# Patient Record
Sex: Female | Born: 1950
Health system: Southern US, Community
[De-identification: ages and names within clinical notes are randomized; demographics above are authoritative.]

## PROBLEM LIST (undated history)

## (undated) DIAGNOSIS — M1990 Unspecified osteoarthritis, unspecified site: Secondary | ICD-10-CM

## (undated) DIAGNOSIS — B029 Zoster without complications: Secondary | ICD-10-CM

## (undated) DIAGNOSIS — C539 Malignant neoplasm of cervix uteri, unspecified: Secondary | ICD-10-CM

## (undated) DIAGNOSIS — E119 Type 2 diabetes mellitus without complications: Secondary | ICD-10-CM

## (undated) DIAGNOSIS — E785 Hyperlipidemia, unspecified: Secondary | ICD-10-CM

## (undated) DIAGNOSIS — I739 Peripheral vascular disease, unspecified: Secondary | ICD-10-CM

## (undated) DIAGNOSIS — F419 Anxiety disorder, unspecified: Secondary | ICD-10-CM

## (undated) HISTORY — DX: Zoster without complications: B02.9

## (undated) HISTORY — DX: Malignant neoplasm of cervix uteri, unspecified: C53.9

## (undated) HISTORY — PX: PARTIAL HYSTERECTOMY: SHX80

## (undated) HISTORY — DX: Type 2 diabetes mellitus without complications: E11.9

## (undated) HISTORY — DX: Anxiety disorder, unspecified: F41.9

## (undated) HISTORY — PX: ROTATOR CUFF REPAIR: SHX139

## (undated) HISTORY — DX: Unspecified osteoarthritis, unspecified site: M19.90

## (undated) HISTORY — DX: Peripheral vascular disease, unspecified: I73.9

## (undated) HISTORY — PX: ABDOMINAL HYSTERECTOMY: SHX81

## (undated) HISTORY — DX: Hyperlipidemia, unspecified: E78.5

---

## 2014-11-29 ENCOUNTER — Encounter: Payer: Self-pay | Admitting: Physician Assistant

## 2014-11-29 ENCOUNTER — Ambulatory Visit: Payer: Self-pay | Admitting: Physician Assistant

## 2014-11-29 VITALS — BP 138/88 | HR 74 | Temp 98.5°F

## 2014-11-29 DIAGNOSIS — J209 Acute bronchitis, unspecified: Secondary | ICD-10-CM

## 2014-11-29 MED ORDER — METHYLPREDNISOLONE 4 MG PO TBPK: 4.0000 mg | ORAL_TABLET | Freq: Four times a day (QID) | ORAL | Status: AC

## 2014-11-29 MED ORDER — IPRATROPIUM-ALBUTEROL 0.5-2.5 (3) MG/3ML IN SOLN: 3.0000 mL | Freq: Once | RESPIRATORY_TRACT | Status: DC

## 2014-11-29 NOTE — Progress Notes (Signed)
S: C/o cough and congestion with wheezing and chest pain, chest is sore from coughing,mucus is yellow, or cough hacking; cough is making her fatigued, hears wheezing, using inhaler with little relief,  keeping pt awake at night;  denies cardiac type chest pain, v/d, abd pain Remainder ros neg  O: vitals wnl, nad, tms clear, throat injected, neck supple no lymph, lungs with wheezing, cv rrr, neuro intact; svn duoneb given in clinic; lungs mostly c t a after svn  A:  Acute bronchitis   P:   Medrol dose pack; use otc meds, tylenol or motrin as needed for fever/chills, return if not better in 3 -5 days, return earlier if worsening, will consider levaquin if patient worsening

## 2014-12-18 ENCOUNTER — Ambulatory Visit (INDEPENDENT_AMBULATORY_CARE_PROVIDER_SITE_OTHER): Payer: 59 | Admitting: Primary Care

## 2014-12-18 ENCOUNTER — Encounter: Payer: Self-pay | Admitting: Primary Care

## 2014-12-18 VITALS — BP 116/70 | HR 68 | Temp 97.8°F | Ht 66.25 in | Wt 175.1 lb

## 2014-12-18 DIAGNOSIS — F418 Other specified anxiety disorders: Secondary | ICD-10-CM

## 2014-12-18 DIAGNOSIS — E119 Type 2 diabetes mellitus without complications: Secondary | ICD-10-CM

## 2014-12-18 DIAGNOSIS — F419 Anxiety disorder, unspecified: Secondary | ICD-10-CM

## 2014-12-18 DIAGNOSIS — R609 Edema, unspecified: Secondary | ICD-10-CM

## 2014-12-18 DIAGNOSIS — F32A Depression, unspecified: Secondary | ICD-10-CM

## 2014-12-18 DIAGNOSIS — R21 Rash and other nonspecific skin eruption: Secondary | ICD-10-CM

## 2014-12-18 DIAGNOSIS — M25473 Effusion, unspecified ankle: Secondary | ICD-10-CM

## 2014-12-18 DIAGNOSIS — F411 Generalized anxiety disorder: Secondary | ICD-10-CM | POA: Insufficient documentation

## 2014-12-18 DIAGNOSIS — E1165 Type 2 diabetes mellitus with hyperglycemia: Secondary | ICD-10-CM | POA: Insufficient documentation

## 2014-12-18 DIAGNOSIS — F329 Major depressive disorder, single episode, unspecified: Secondary | ICD-10-CM | POA: Insufficient documentation

## 2014-12-18 MED ORDER — PREDNISONE 20 MG PO TABS: ORAL_TABLET | ORAL | Status: DC

## 2014-12-18 MED ORDER — PERMETHRIN 5 % EX CREA: 1.0000 "application " | TOPICAL_CREAM | Freq: Once | CUTANEOUS | Status: DC

## 2014-12-18 NOTE — Assessment & Plan Note (Signed)
Present to bilateral lower extremities gradually throughout the day, improvement after a full night's rest. 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  Discussed to rise and walk every 30 minutes at work. Elevate feet when at home.

## 2014-12-18 NOTE — Assessment & Plan Note (Signed)
Diagnoed 6-7 years ago and is currently managed on Citalopram 20 mg daily and Alprazolam 0.5 mg very sparingly. Feels well managed on citalopram. Denies SI/HI.

## 2014-12-18 NOTE — Progress Notes (Signed)
Pre visit review using our clinic review tool, if applicable. No additional management support is needed unless otherwise documented below in the visit note. 

## 2014-12-18 NOTE — Patient Instructions (Signed)
Elevate your legs when you get home after work to reduce ankle swelling. Try to get up every 30 minutes while at work to keep your ankles from swelling.  Please schedule a physical with me in the next 3 months. You will also schedule a lab only appointment one week prior. We will discuss your lab results during your physical.  Apply Permethrin cream once. Start at your neck and apply over your entire body, including in between your toes. Leave on for 8 hours and then wash off.  Start prednisone tablets. Take 2 tablets by mouth daily for 4 days, then 1 tablet daily for 4 days.  It was a pleasure to meet you today! Please don't hesitate to call me with any questions. Welcome to [facility]!

## 2014-12-18 NOTE — Assessment & Plan Note (Signed)
Endorses long history of borderline diabetes and was told several years ago she had full diabetes. She is unsure of her last A1C, perhaps drawn 2 years ago. She manages with diet alone. Will check A1C at upcoming physical.

## 2014-12-18 NOTE — Progress Notes (Signed)
Subjective:    Patient ID: Maria Sloan, female    DOB: 1950/05/20, 64 y.o.   MRN: [id number]  HPI  Maria Sloan is a 64 year old female who presents today to establish care and discuss the problems mentioned below. Will obtain old records.  1) Ankle Swelling: Present bilaterally. She will notice gradual swelling throughout the day that has been present for the past 2-3 months since working at her new occupation. She works at a desk all day long and does not get up frequently to walk. Denies pain, changes in skin tone.   2) Rash: Present intermittently since June 2016. The rash will last for about 2.5 weeks at a time and are located to bilateral upper and lower extremities. She describes her rash as itchy. Denies pain. She just moved into a new apartment and got her mattress out of storage. She does have a dog and noticed a few fleas on her dog who has since been treated. She's tried applying OTC hydrocortisone and calomine lotion with temporary relief. She has used insecticide in her apartment.   3) Anxiety and Depression: Diagnosed about 6-7 years ago. Currently managed on Citalopram 20 mg and Alprazolam 0.5 mg. She will use the Alprazolam very sparingly at night for sleep and anxiety. Last tablet of Alprazolam was about 2-3 months ago. She attempted to wean off the Citalopram just after moving to [place] and noticed an increase in tearfulness and sadness. She has since resumed her citalopram and feels well managed.  4) Type 2 Diabetes: Diagnosed about 10 years ago. She manages with healthy diet and exercise. She is unsure of her last A1C, believes it may have been 2 years ago. Denies numbness, tingling, chest pain, dizziness.  Review of Systems  Constitutional: Negative for fever and chills.  HENT: Negative for rhinorrhea.   Respiratory: Negative for shortness of breath and wheezing.   Cardiovascular: Negative for chest pain.  Gastrointestinal: Negative for diarrhea and constipation.    Genitourinary: Negative for difficulty urinating.       Partial hysterectomy with ovaries.  Musculoskeletal: Positive for arthralgias. Negative for myalgias.       Chronic knee pain, tolerable. Does not interfere with daily routine.  Skin: Positive for rash.  Allergic/Immunologic: Positive for environmental allergies.  Neurological: Negative for dizziness and numbness.  Psychiatric/Behavioral: Negative for sleep disturbance. The patient is not nervous/anxious.        See HPI       Past Medical History  Diagnosis Date  . PVD (peripheral vascular disease) ([place])   . Type 2 diabetes mellitus ([place])   . Anxiety and depression   . Cervical cancer ([place])   . Shingles     Social History   Social History  . Marital Status: Unknown    Spouse Name: N/A  . Number of Children: N/A  . Years of Education: N/A   Occupational History  . Not on file.   Social History Main Topics  . Smoking status: Former Smoker    Quit date: 11/29/1994  . Smokeless tobacco: Never Used  . Alcohol Use: Not on file  . Drug Use: Not on file  . Sexual Activity: Not on file   Other Topics Concern  . Not on file   Social History Narrative   Divorced.   1 son.    Moved from [place].   Enjoys making jewelry, spending time with family, crafts, reading.    Past Surgical History  Procedure Laterality Date  . Partial hysterectomy    .  Rotator cuff repair Right     Family History  Problem Relation Age of Onset  . Emphysema Father   . Lung cancer Father   . Stroke Mother   . Hyperlipidemia Father   . Hyperlipidemia Sister     Allergies  Allergen Reactions  . Actifed Cold-Allergy [Chlorpheniramine-Phenylephrine]   . Compazine [Prochlorperazine]   . Penicillins     Current Outpatient Prescriptions on File Prior to Visit  Medication Sig Dispense Refill  . ALPRAZolam (XANAX) 0.5 MG tablet Take 0.5 mg by mouth at bedtime as needed for anxiety.    . citalopram (CELEXA) 20 MG tablet Take 20 mg by  mouth daily.  1   No current facility-administered medications on file prior to visit.    BP 116/70 mmHg  Pulse 68  Temp(Src) 97.8 F (36.6 C) (Oral)  Ht 5' 6.25" (1.683 m)  Wt 175 lb 1.9 oz (79.434 kg)  BMI 28.04 kg/m2  SpO2 97%    Objective:   Physical Exam  Constitutional: She is oriented to person, place, and time. She appears well-nourished.  Neck: Neck supple.  Cardiovascular: Normal rate and regular rhythm.   Pulmonary/Chest: Effort normal and breath sounds normal.  Musculoskeletal: Normal range of motion.  Neurological: She is alert and oriented to person, place, and time.  Skin: Skin is warm and dry.  Small bumps noted to bilateral upper extremities, hands, and feet that somewhat represent scabies or flea bites. No drainage. Non tender.  Psychiatric: She has a normal mood and affect.          Assessment & Plan:  Rash:  Intermittent since June 2016 after moving to a new apartment. Mattress stored in storage, also has a dog with fleas. Suspect either scabies or fleas. Exam and HPI are suspicious for scabies. Will treat for both. RX for Permethrin to use once and also with prednisone taper. Discussed to Lysol bed frames and mattresses, discussed proper cleaning and flea treatments. Follow up PRN.

## 2014-12-26 ENCOUNTER — Ambulatory Visit (INDEPENDENT_AMBULATORY_CARE_PROVIDER_SITE_OTHER): Payer: 59 | Admitting: Internal Medicine

## 2014-12-26 ENCOUNTER — Encounter: Payer: Self-pay | Admitting: Internal Medicine

## 2014-12-26 VITALS — BP 122/78 | HR 70

## 2014-12-26 DIAGNOSIS — Z87891 Personal history of nicotine dependence: Secondary | ICD-10-CM

## 2014-12-26 DIAGNOSIS — R05 Cough: Secondary | ICD-10-CM | POA: Diagnosis not present

## 2014-12-26 DIAGNOSIS — Z825 Family history of asthma and other chronic lower respiratory diseases: Secondary | ICD-10-CM

## 2014-12-26 DIAGNOSIS — R059 Cough, unspecified: Secondary | ICD-10-CM | POA: Insufficient documentation

## 2014-12-26 LAB — NITRIC OXIDE: Nitric Oxide: 18

## 2014-12-26 NOTE — Addendum Note (Signed)
Addended by: Beckie Busing on: 12/26/2014 05:48 PM   Modules accepted: Orders

## 2014-12-26 NOTE — Progress Notes (Signed)
Subjective:    Patient ID: Maria Sloan, female    DOB: 07/04/1950, 64 y.o.   MRN: [id number] PCP Sheral Flow, NP  HPI  IOV 12/26/2014  Chief Complaint  Patient presents with  . Pulmonary Consult    Pt c/o recent bronchial infection, most symptoms have improved although still c/o continued mucus in her throat. Pt has family hx of COPD.    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 She has to talk on the phone a lot. She moved from [facility] in April 2016 to be with her son. She has a less than 20 pack smoking history smoking from 1978 through [place] at three fourth of a pack a day. Her dad has COPD.  At baseline she is reasonably fit and has no health issues. Then 11/23/2014 she developed what she thought was a bronchitis with Pat cough. Then on Monday, 11/26/2014 she was seen at employee health and was given antibiotics for "bronchial infection" according to her history. Subsequently on Thursday, 11/30/2014 a colleague nurse at work but she had asthma because by the stem patient was having cough and shortness of breath that persisted. Shortness of breath was new. Subsequently some 2 weeks later the cough resolved. By this time the dyspnea also had improved. At this point in time dyspnea still persistent. One week ago she saw her primary care physician and given family history of COPD and former history of smoking has been referred here.  Currently she reports no cough but she does have a little shortness of breath. It is present only in the daytime. Never at nighttime. Made worse with talking on the phone. It is associated with scratchy throat, hoarse voice. She's also clearing the throat a lot. She feels like she has laryngitis. This is when dyspnea gets worse. Sometimes dyspnea gets worse with exertion as well. Symptoms are better with voice rest and physical rest. There is no associated wheezing or  fever or chills or hemoptysis.   COPD CAT score is 7 and shows only mild burden of symptoms. A diagnosis of COPD is not confirmed in this patient.  Exhaled nitric oxide today in the office shows 18 ppb and normal      has a past medical history of PVD (peripheral vascular disease) ([place]); Type 2 diabetes mellitus ([place]); Anxiety and depression; Cervical cancer ([place]); and Shingles.   reports that she quit smoking about 20 years ago. She has never used smokeless tobacco.  Past Surgical History  Procedure Laterality Date  . Partial hysterectomy    . Rotator cuff repair Right     Allergies  Allergen Reactions  . Actifed Cold-Allergy [Chlorpheniramine-Phenylephrine]   . Compazine [Prochlorperazine]   . Penicillins     Immunization History  Administered Date(s) Administered  . Influenza-Unspecified 12/10/2014    Family History  Problem Relation Age of Onset  . Emphysema Father   . Lung cancer Father   . Stroke Mother   . Hyperlipidemia Father   . Hyperlipidemia Sister      Current outpatient prescriptions:  .  ALPRAZolam (XANAX) 0.5 MG tablet, Take 0.5 mg by mouth at bedtime as needed for anxiety., Disp: , Rfl:  .  citalopram (CELEXA) 20 MG tablet, Take 20 mg by mouth daily., Disp: , Rfl: 1 .  Ginkgo Biloba 40 MG TABS, Take 1 tablet by mouth daily., Disp: , Rfl:  .  Misc Natural Products (OSTEO BI-FLEX JOINT SHIELD  PO), Take 1 tablet by mouth daily., Disp: , Rfl:  .  Multiple Vitamin (MULTIVITAMIN) tablet, Take 1 tablet by mouth daily., Disp: , Rfl:  .  permethrin (ELIMITE) 5 % cream, Apply 1 application topically once., Disp: 60 g, Rfl: 0 .  Potassium 75 MG TABS, Take 1 tablet by mouth daily., Disp: , Rfl:  .  predniSONE (DELTASONE) 20 MG tablet, Take 2 tablets daily for 4 days, then 1 tablet daily for 4 days., Disp: 12 tablet, Rfl: 0      Review of Systems  Constitutional: Negative for fever and unexpected weight change.  HENT: Positive for congestion. Negative for  dental problem, ear pain, nosebleeds, postnasal drip, rhinorrhea, sinus pressure, sneezing, sore throat and trouble swallowing.   Eyes: Negative for redness and itching.  Respiratory: Positive for cough and shortness of breath. Negative for chest tightness and wheezing.   Cardiovascular: Negative for palpitations and leg swelling.  Gastrointestinal: Negative for nausea and vomiting.  Genitourinary: Negative for dysuria.  Musculoskeletal: Negative for joint swelling.  Skin: Negative for rash.  Neurological: Negative for headaches.  Hematological: Does not bruise/bleed easily.  Psychiatric/Behavioral: Negative for dysphoric mood. The patient is not nervous/anxious.        Objective:   Physical Exam  Constitutional: She is oriented to person, place, and time. She appears well-developed and well-nourished. No distress.  HENT:  Head: Normocephalic and atraumatic.  Right Ear: External ear normal.  Left Ear: External ear normal.  Mouth/Throat: Oropharynx is clear and moist. No oropharyngeal exudate.  Eyes: Conjunctivae and EOM are normal. Pupils are equal, round, and reactive to light. Right eye exhibits no discharge. Left eye exhibits no discharge. No scleral icterus.  Neck: Normal range of motion. Neck supple. No JVD present. No tracheal deviation present. No thyromegaly present.  Cardiovascular: Normal rate, regular rhythm, normal heart sounds and intact distal pulses.  Exam reveals no gallop and no friction rub.   No murmur heard. Pulmonary/Chest: Effort normal and breath sounds normal. No respiratory distress. She has no wheezes. She has no rales. She exhibits no tenderness.  Abdominal: Soft. Bowel sounds are normal. She exhibits no distension and no mass. There is no tenderness. There is no rebound and no guarding.  Musculoskeletal: Normal range of motion. She exhibits no edema or tenderness.  Lymphadenopathy:    She has no cervical adenopathy.  Neurological: She is alert and oriented  to person, place, and time. She has normal reflexes. No cranial nerve deficit. She exhibits normal muscle tone. Coordination normal.  Skin: Skin is warm and dry. No rash noted. She is not diaphoretic. No erythema. No pallor.  Psychiatric: She has a normal mood and affect. Her behavior is normal. Judgment and thought content normal.  Vitals reviewed.   Filed Vitals:   12/26/14 1633  BP: 122/78  Pulse: 70  SpO2: 97%         Assessment & Plan:     ICD-9-CM ICD-10-CM   1. Cough 786.2 R05 Nitric oxide     Nitric oxide  2. Family history of COPD (chronic obstructive pulmonary disease) V17.6 Z82.5   3. History of smoking 10-25 pack years V15.82 Z87.891      Even though  you feel feel shortness of breath this syndrome fits in with post viral reactive cough Expect this to normally burnt out in the next 4-8 week In order to ensure this does not turn chronic I recommend the following   -  take generic fluticasone inhaler 2 squirts each  nostril daily x 4-6 weeks  - OTC Zegerid 20mg  daily on empty stomach x 4- 6 weeks  - Take adequate voice rest  - Drink plenty of water when you ha ve symptoms  - Try sugarless lozenge when you have symptoms   Follow-up  - Full pulmonary function test in 6 weeks  - Return to see me or my nurse practitioner in 6 weeks after full pulmonary function test - Return to see me or my nurse practitioner in 6 weeks after full pulmonary function test - If cough persists move to phase 2 of cough workup including imaging   Dr. Brand Males, M.D., [facility].C.P Pulmonary and Critical Care Medicine Staff Physician [facility] Pager: [phone], If no answer or between  15:00h - 7:00h: call [phone]  12/26/2014 5:38 PM

## 2014-12-26 NOTE — Patient Instructions (Addendum)
ICD-9-CM ICD-10-CM   1. Cough 786.2 R05 Nitric oxide     Nitric oxide  2. Family history of COPD (chronic obstructive pulmonary disease) V17.6 Z82.5   3. History of smoking 10-25 pack years V15.82 Z87.891    Even though  you feel feel shortness of breath this syndrome fits in with post viral reactive cough Expect this to normally burnt out in the next 4-8 week In order to ensure this does not turn chronic I recommend the following   -  take generic fluticasone inhaler 2 squirts each nostril daily x 4-6 weeks  - OTC Zegerid 20mg  daily on empty stomach x 4- 6 weeks  - Take adequate voice rest  - Drink plenty of water when you ha ve symptoms  - Try sugarless lozenge when you have symptoms   Follow-up  - Full pulmonary function test in 6 weeks because of prior smoking history and family hx copd  - Return to see me or my nurse practitioner in 6 weeks after full pulmonary function test - If cough persists move to phase 2 of cough workup including imaging

## 2014-12-27 ENCOUNTER — Telehealth: Payer: Self-pay | Admitting: Internal Medicine

## 2014-12-27 DIAGNOSIS — Z87891 Personal history of nicotine dependence: Secondary | ICD-10-CM

## 2014-12-27 NOTE — Telephone Encounter (Signed)
Maria Sloan  Let patient Maria Sloan  Know that whens he comes back in 6 weeks we will do cxr 2 view . Please ensure. Indication: hx of smoking, cough, and family hx copd  Thanks  Dr. Brand Males, M.D., [facility].C.P Pulmonary and Critical Care Medicine Staff Physician [place] [facility] Pager: [phone], If no answer or between  15:00h - 7:00h: call [phone]  12/27/2014 9:48 AM

## 2014-12-28 NOTE — Telephone Encounter (Signed)
lmtcb for pt.  

## 2014-12-31 NOTE — Telephone Encounter (Signed)
lmomtcb x 2  

## 2015-01-01 NOTE — Telephone Encounter (Signed)
lmomtcb x1 for pt 

## 2015-01-01 NOTE — Telephone Encounter (Signed)
Patient returned call, can be reached at [phone].

## 2015-01-02 NOTE — Telephone Encounter (Signed)
Pt is aware. Order was placed. Nothing further was needed.

## 2015-01-02 NOTE — Telephone Encounter (Signed)
[phone], pt cb

## 2015-02-07 ENCOUNTER — Encounter: Payer: Self-pay | Admitting: Adult Health

## 2015-02-07 ENCOUNTER — Ambulatory Visit (INDEPENDENT_AMBULATORY_CARE_PROVIDER_SITE_OTHER): Payer: 59 | Admitting: Internal Medicine

## 2015-02-07 ENCOUNTER — Ambulatory Visit (INDEPENDENT_AMBULATORY_CARE_PROVIDER_SITE_OTHER)
Admission: RE | Admit: 2015-02-07 | Discharge: 2015-02-07 | Disposition: A | Discharge status: Home / Self Care | Payer: 59 | Source: Ambulatory Visit | Attending: Adult Health | Admitting: Adult Health

## 2015-02-07 ENCOUNTER — Ambulatory Visit (INDEPENDENT_AMBULATORY_CARE_PROVIDER_SITE_OTHER): Payer: 59 | Admitting: Adult Health

## 2015-02-07 VITALS — BP 108/72 | HR 69 | Temp 97.9°F | Ht 66.0 in | Wt 184.0 lb

## 2015-02-07 DIAGNOSIS — Z87891 Personal history of nicotine dependence: Secondary | ICD-10-CM

## 2015-02-07 DIAGNOSIS — R05 Cough: Secondary | ICD-10-CM

## 2015-02-07 DIAGNOSIS — Z825 Family history of asthma and other chronic lower respiratory diseases: Secondary | ICD-10-CM

## 2015-02-07 DIAGNOSIS — R059 Cough, unspecified: Secondary | ICD-10-CM

## 2015-02-07 LAB — PULMONARY FUNCTION TEST
DL/VA % pred: 75 %
DL/VA: 3.91 ml/min/mmHg/L
DLCO UNC % PRED: 56 %
DLCO unc: 16.13 ml/min/mmHg
FEF 25-75 Post: 2.22 L/sec
FEF 25-75 Pre: 2.56 L/sec
FEF2575-%CHANGE-POST: -13 %
FEF2575-%Pred-Post: 95 %
FEF2575-%Pred-Pre: 110 %
FEV1-%CHANGE-POST: -3 %
FEV1-%PRED-POST: 85 %
FEV1-%Pred-Pre: 88 %
FEV1-PRE: 2.39 L
FEV1-Post: 2.31 L
FEV1FVC-%CHANGE-POST: 3 %
FEV1FVC-%Pred-Pre: 108 %
FEV6-%Change-Post: -6 %
FEV6-%PRED-PRE: 83 %
FEV6-%Pred-Post: 78 %
FEV6-PRE: 2.85 L
FEV6-Post: 2.66 L
FEV6FVC-%PRED-PRE: 104 %
FEV6FVC-%Pred-Post: 104 %
FVC-%CHANGE-POST: -6 %
FVC-%PRED-POST: 75 %
FVC-%PRED-PRE: 80 %
FVC-POST: 2.66 L
FVC-PRE: 2.86 L
POST FEV1/FVC RATIO: 87 %
POST FEV6/FVC RATIO: 100 %
PRE FEV6/FVC RATIO: 100 %
Pre FEV1/FVC ratio: 84 %
RV % pred: 78 %
RV: 1.76 L
TLC % pred: 82 %
TLC: 4.54 L

## 2015-02-07 NOTE — Assessment & Plan Note (Signed)
Most likely a post viral cough- resolved Pulmonary function test today shows a normal lung function. Patient does have a diffusing defect, questionable etiology. She has no desaturations with ambulation. In The office on room air. And chest x-ray is clear. No known anemia  The patient's symptoms return will need further investigation.   Plan  Continue on current regimen Follow  up in 3 months with Dr. Chase Caller

## 2015-02-07 NOTE — Progress Notes (Signed)
PFT done today. 

## 2015-02-07 NOTE — Progress Notes (Signed)
Subjective:    Patient ID: Maria Sloan, female    DOB: [birth year], 64 y.o.   MRN: [id number]  HPI 64 yo female   02/07/2015 Follow up :  Returns for a 6 week follow-up. She was seen for a ronsult last visit for persistent cough and possible COPD. Had a slow to resolve bronchitis. A former smoker. Has a family history of COPD. Patient returns today reporting that she is feeling much improved. Her cough has totally resolved. Patient denies any shortness of breath.. EXT today shows FEV1 at 88%, ratio 84, FVC 80%, no bronchodilator response. Diffusing capacity decreased at 56%. Chest x-ray today is clear. Walk test in office with no desaturations on room air. Patient denies any known history of anemia. He denies any chest pain, palpitations, orthopnea, PND or leg swelling..  Past Medical History  Diagnosis Date  . PVD (peripheral vascular disease) ([place])   . Type 2 diabetes mellitus ([place])   . Anxiety and depression   . Cervical cancer ([place])   . Shingles      Current Outpatient Prescriptions on File Prior to Visit  Medication Sig Dispense Refill  . ALPRAZolam (XANAX) 0.5 MG tablet Take 0.5 mg by mouth at bedtime as needed for anxiety.    . citalopram (CELEXA) 20 MG tablet Take 20 mg by mouth daily.  1  . Ginkgo Biloba 40 MG TABS Take 1 tablet by mouth daily.    . Misc Natural Products (OSTEO BI-FLEX JOINT SHIELD PO) Take 1 tablet by mouth daily.    . Multiple Vitamin (MULTIVITAMIN) tablet Take 1 tablet by mouth daily.    . permethrin (ELIMITE) 5 % cream Apply 1 application topically once. 60 g 0  . Potassium 75 MG TABS Take 1 tablet by mouth daily.     No current facility-administered medications on file prior to visit.    Review of Systems Constitutional:   No  weight loss, night sweats,  Fevers, chills, fatigue, or  lassitude.  HEENT:   No headaches,  Difficulty swallowing,  Tooth/dental problems, or  Sore throat,                No sneezing, itching, ear ache, nasal  congestion, post nasal drip,   CV:  No chest pain,  Orthopnea, PND, swelling in lower extremities, anasarca, dizziness, palpitations, syncope.   GI  No heartburn, indigestion, abdominal pain, nausea, vomiting, diarrhea, change in bowel habits, loss of appetite, bloody stools.   Resp: No shortness of breath with exertion or at rest.  No excess mucus, no productive cough,  No non-productive cough,  No coughing up of blood.  No change in color of mucus.  No wheezing.  No chest wall deformity  Skin: no rash or lesions.  GU: no dysuria, change in color of urine, no urgency or frequency.  No flank pain, no hematuria   MS:  No joint pain or swelling.  No decreased range of motion.  No back pain.  Psych:  No change in mood or affect. No depression or anxiety.  No memory loss.         Objective:   Physical Exam  GEN: A/Ox3; pleasant , NAD, well nourished   Vital signs reviewed  HEENT:  [place]/AT,  EACs-clear, TMs-wnl, NOSE-clear, THROAT-clear, no lesions, no postnasal drip or exudate noted.   NECK:  Supple w/ fair ROM; no JVD; normal carotid impulses w/o bruits; no thyromegaly or nodules palpated; no lymphadenopathy.  RESP  Clear  P & A; w/o,  wheezes/ rales/ or rhonchi.no accessory muscle use, no dullness to percussion  CARD:  RRR, no m/r/g  , no peripheral edema, pulses intact, no cyanosis or clubbing.  GI:   Soft & nt; nml bowel sounds; no organomegaly or masses detected.  Musco: Warm bil, no deformities or joint swelling noted.   Neuro: alert, no focal deficits noted.    Skin: Warm, no lesions or rashes        Assessment & Plan:

## 2015-02-07 NOTE — Patient Instructions (Signed)
Glad you are feeling better.  Continue on current regimen.  Follow up Dr. Chase Caller in 3-4 months and As needed

## 2015-03-05 ENCOUNTER — Other Ambulatory Visit: Payer: Self-pay | Admitting: Primary Care

## 2015-03-05 DIAGNOSIS — Z0000 Encounter for general adult medical examination without abnormal findings: Secondary | ICD-10-CM

## 2015-03-05 DIAGNOSIS — E785 Hyperlipidemia, unspecified: Secondary | ICD-10-CM

## 2015-03-14 ENCOUNTER — Other Ambulatory Visit (INDEPENDENT_AMBULATORY_CARE_PROVIDER_SITE_OTHER): Payer: 59

## 2015-03-14 ENCOUNTER — Telehealth: Payer: Self-pay | Admitting: Primary Care

## 2015-03-14 DIAGNOSIS — Z0000 Encounter for general adult medical examination without abnormal findings: Secondary | ICD-10-CM

## 2015-03-14 DIAGNOSIS — E785 Hyperlipidemia, unspecified: Secondary | ICD-10-CM

## 2015-03-14 LAB — COMPREHENSIVE METABOLIC PANEL
ALBUMIN: 4.2 g/dL (ref 3.5–5.2)
ALT: 27 U/L (ref 0–35)
AST: 26 U/L (ref 0–37)
Alkaline Phosphatase: 57 U/L (ref 39–117)
BILIRUBIN TOTAL: 0.4 mg/dL (ref 0.2–1.2)
BUN: 15 mg/dL (ref 6–23)
CALCIUM: 9.2 mg/dL (ref 8.4–10.5)
CO2: 30 mEq/L (ref 19–32)
CREATININE: 0.78 mg/dL (ref 0.40–1.20)
Chloride: 103 mEq/L (ref 96–112)
GFR: 78.8 mL/min (ref >60.00)
Glucose, Bld: 94 mg/dL (ref 70–99)
Potassium: 4.1 mEq/L (ref 3.5–5.1)
SODIUM: 139 meq/L (ref 135–145)
TOTAL PROTEIN: 6.8 g/dL (ref 6.0–8.3)

## 2015-03-14 LAB — LIPID PANEL
CHOL/HDL RATIO: 7
CHOLESTEROL: 231 mg/dL — AB (ref 0–200)
HDL: 31.4 mg/dL — ABNORMAL LOW (ref >39.00)
Triglycerides: 613 mg/dL — ABNORMAL HIGH (ref 0.0–149.0)

## 2015-03-14 LAB — LDL CHOLESTEROL, DIRECT: LDL DIRECT: 110 mg/dL

## 2015-03-14 LAB — VITAMIN D 25 HYDROXY (VIT D DEFICIENCY, FRACTURES): VITD: 18.86 ng/mL — ABNORMAL LOW (ref 30.00–100.00)

## 2015-03-14 LAB — HEMOGLOBIN A1C: HEMOGLOBIN A1C: 5.9 % (ref 4.6–6.5)

## 2015-03-14 LAB — TSH: TSH: 0.92 u[IU]/mL (ref 0.35–4.50)

## 2015-03-14 NOTE — Telephone Encounter (Signed)
Okay to do labs, but if she'd like to do them during her physical, that's fine too.

## 2015-03-14 NOTE — Telephone Encounter (Signed)
Patient had her labs done.

## 2015-03-14 NOTE — Telephone Encounter (Signed)
Pt is getting over a cold and does not know whether her cold will affect her labs. Pt is unsure if she should still come in for labs  cb number is [phone] Pt request cb

## 2015-03-21 ENCOUNTER — Encounter: Payer: Self-pay | Admitting: Primary Care

## 2015-04-05 ENCOUNTER — Ambulatory Visit (INDEPENDENT_AMBULATORY_CARE_PROVIDER_SITE_OTHER): Payer: 59 | Admitting: Primary Care

## 2015-04-05 ENCOUNTER — Encounter: Payer: Self-pay | Admitting: Primary Care

## 2015-04-05 VITALS — BP 124/74 | HR 67 | Temp 98.2°F | Ht 66.0 in | Wt 185.8 lb

## 2015-04-05 DIAGNOSIS — Z13820 Encounter for screening for osteoporosis: Secondary | ICD-10-CM | POA: Diagnosis not present

## 2015-04-05 DIAGNOSIS — Z1211 Encounter for screening for malignant neoplasm of colon: Secondary | ICD-10-CM

## 2015-04-05 DIAGNOSIS — E782 Mixed hyperlipidemia: Secondary | ICD-10-CM | POA: Insufficient documentation

## 2015-04-05 DIAGNOSIS — Z1239 Encounter for other screening for malignant neoplasm of breast: Secondary | ICD-10-CM | POA: Diagnosis not present

## 2015-04-05 DIAGNOSIS — E785 Hyperlipidemia, unspecified: Secondary | ICD-10-CM | POA: Insufficient documentation

## 2015-04-05 DIAGNOSIS — Z0000 Encounter for general adult medical examination without abnormal findings: Secondary | ICD-10-CM

## 2015-04-05 DIAGNOSIS — E559 Vitamin D deficiency, unspecified: Secondary | ICD-10-CM

## 2015-04-05 DIAGNOSIS — R7303 Prediabetes: Secondary | ICD-10-CM

## 2015-04-05 DIAGNOSIS — E781 Pure hyperglyceridemia: Secondary | ICD-10-CM

## 2015-04-05 DIAGNOSIS — F32A Depression, unspecified: Secondary | ICD-10-CM

## 2015-04-05 DIAGNOSIS — Z23 Encounter for immunization: Secondary | ICD-10-CM | POA: Diagnosis not present

## 2015-04-05 DIAGNOSIS — F329 Major depressive disorder, single episode, unspecified: Secondary | ICD-10-CM

## 2015-04-05 DIAGNOSIS — F418 Other specified anxiety disorders: Secondary | ICD-10-CM

## 2015-04-05 DIAGNOSIS — F419 Anxiety disorder, unspecified: Secondary | ICD-10-CM

## 2015-04-05 MED ORDER — EZETIMIBE 10 MG PO TABS: 10.0000 mg | ORAL_TABLET | Freq: Every day | ORAL | Status: DC

## 2015-04-05 NOTE — Assessment & Plan Note (Signed)
Trigs over 600 on recent labs. Trigs from records in 2014 at 140. Strong FH of hypertriglyceridemia. Start Zetia 10 mg daily and Fish Oil 3000 mg daily. Discussed the importance of a healthy diet and regular exercise in order for weight loss and to reduce risk of other medical diseases.  Repeat labs in 3 months.

## 2015-04-05 NOTE — Patient Instructions (Addendum)
You will be contacted regarding your referral to GI for your colonoscopy, bone density testing, and mammogram.  Please let us know if you have not heard back within one week.   Start Zetia 10 mg tablets for elevated triglycerides. Take 1 tablet by mouth daily.   Continue taking Fish Oil 3000 mg daily.  Start taking Vitamin D supplement for low levels. Take 2000 units daily. This may be purchased over the counter.   Call your insurance company regarding the Shingles vaccination as discussed.  It is important that you improve your diet. Please limit carbohydrates in the form of white bread, rice, pasta, cakes, cookies, sugary drinks, etc. Increase your consumption of fresh fruits and vegetables.  You need to consume about 2 liters of water daily.  Schedule a lab only appointment in 3 months for recheck of vitamin D, borderline diabetes, and triglycerides.  Please schedule a follow up appointment in 6 months.   It was a pleasure to see you today!  Food Choices to Lower Your Triglycerides Triglycerides are a type of fat in your blood. High levels of triglycerides can increase the risk of heart disease and stroke. If your triglyceride levels are high, the foods you eat and your eating habits are very important. Choosing the right foods can help lower your triglycerides.  WHAT GENERAL GUIDELINES DO I NEED TO FOLLOW?  Lose weight if you are overweight.   Limit or avoid alcohol.   Fill one half of your plate with vegetables and green salads.   Limit fruit to two servings a day. Choose fruit instead of juice.   Make one fourth of your plate whole grains. Look for the word "whole" as the first word in the ingredient list.  Fill one fourth of your plate with lean protein foods.  Enjoy fatty fish (such as salmon, mackerel, sardines, and tuna) three times a week.   Choose healthy fats.   Limit foods high in starch and sugar.  Eat more home-cooked food and less restaurant, buffet,  and fast food.  Limit fried foods.  Cook foods using methods other than frying.  Limit saturated fats.  Check ingredient lists to avoid foods with partially hydrogenated oils (trans fats) in them. WHAT FOODS CAN I EAT?  Grains Whole grains, such as whole wheat or whole grain breads, crackers, cereals, and pasta. Unsweetened oatmeal, bulgur, barley, quinoa, or brown rice. Corn or whole wheat flour tortillas.  Vegetables Fresh or frozen vegetables (raw, steamed, roasted, or grilled). Green salads. Fruits All fresh, canned (in natural juice), or frozen fruits. Meat and Other Protein Products Ground beef (85% or leaner), grass-fed beef, or beef trimmed of fat. Skinless chicken or [place]. Ground chicken or [place]. Pork trimmed of fat. All fish and seafood. Eggs. Dried beans, peas, or lentils. Unsalted nuts or seeds. Unsalted canned or dry beans. Dairy Low-fat dairy products, such as skim or 1% milk, 2% or reduced-fat cheeses, low-fat ricotta or cottage cheese, or plain low-fat yogurt. Fats and Oils Tub margarines without trans fats. Light or reduced-fat mayonnaise and salad dressings. Avocado. Safflower, olive, or canola oils. Natural peanut or almond butter. The items listed above may not be a complete list of recommended foods or beverages. Contact your dietitian for more options. WHAT FOODS ARE NOT RECOMMENDED?  Grains White bread. White pasta. White rice. Cornbread. Bagels, pastries, and croissants. Crackers that contain trans fat. Vegetables White potatoes. Corn. Creamed or fried vegetables. Vegetables in a cheese sauce. Fruits Dried fruits. Canned fruit in light  or heavy syrup. Fruit juice. Meat and Other Protein Products Fatty cuts of meat. Ribs, chicken wings, bacon, sausage, bologna, salami, chitterlings, fatback, hot dogs, bratwurst, and packaged luncheon meats. Dairy Whole or 2% milk, cream, half-and-half, and cream cheese. Whole-fat or sweetened yogurt. Full-fat cheeses.  Nondairy creamers and whipped toppings. Processed cheese, cheese spreads, or cheese curds. Sweets and Desserts Corn syrup, sugars, honey, and molasses. Candy. Jam and jelly. Syrup. Sweetened cereals. Cookies, pies, cakes, donuts, muffins, and ice cream. Fats and Oils Butter, stick margarine, lard, shortening, ghee, or bacon fat. Coconut, palm kernel, or palm oils. Beverages Alcohol. Sweetened drinks (such as sodas, lemonade, and fruit drinks or punches). The items listed above may not be a complete list of foods and beverages to avoid. Contact your dietitian for more information.   This information is not intended to replace advice given to you by your health care provider. Make sure you discuss any questions you have with your health care provider.   Document Released: 12/05/2003 Document Revised: 03/09/2014 Document Reviewed: 12/21/2012 Elsevier Interactive Patient Education [employer].

## 2015-04-05 NOTE — Assessment & Plan Note (Signed)
A1C of 5.9. Discussed the importance of a healthy diet and regular exercise in order for weight loss and to reduce risk of other medical diseases. She is to start exercising. Will repeat in 3 months.

## 2015-04-05 NOTE — Progress Notes (Signed)
Subjective:    Patient ID: Gai Neveu, female    DOB: [birth year], 65 y.o.   MRN: [id number]  HPI  Ms. Kanouse is a 65 year old female who presents today for complete physical.  Immunizations: -Tetanus: Unsure. Believes it's over 10 years.  -Influenza: Completed in October 2016 -Pneumonia: Never completed, not due. -Shingles: Never completed. History of shingles.  Diet: Endorses a fair diet. Breakfast: Egg beaters, veggie sausage, spinich Snack: Fruit and peanut butter, cheese stick Lunch: Roasted vegetables, chicken patty, jello or yogurt Dinner: Soup, pasta, chicken sausage, beans Snacks: Cookies, chips, pretzzles, pudding Desserts: Daily Beverages: Coffee, flavored water, limited soda  Exercise: She is currently not exercising. Eye exam: Completed 2 years ago. Due. Dental exam: Completed 1 year ago. Colonoscopy: Never completed. Dexa: Completed years ago.  Pap Smear: Partial hysterectomy, ovaires remain. Mammogram: Completed 1 year.   1) Rectal Bleeding: Started 2 months ago. She's noticed bright red blood on the toilet paper after a bowel movement. She's not noticed any bleeding this week. She endorsed pressure to her rectum years ago, denies discomfort recently. The blood is not in the toilet bowl, only on the toilet paper and is a scant amount. Denies weakness, fatigue.   Review of Systems  Constitutional: Negative for unexpected weight change.  HENT: Negative for rhinorrhea.   Respiratory: Negative for cough and shortness of breath.   Gastrointestinal: Positive for anal bleeding. Negative for abdominal pain, diarrhea, constipation and rectal pain.       Rectal bleeding  Musculoskeletal: Negative for myalgias and arthralgias.  Skin: Negative for rash.  Allergic/Immunologic: Positive for environmental allergies.  Neurological: Negative for dizziness, numbness and headaches.  Psychiatric/Behavioral:       Denies concerns for anxiety or depression       Past  Medical History  Diagnosis Date  . PVD (peripheral vascular disease) ([place])   . Type 2 diabetes mellitus ([place])   . Anxiety and depression   . Cervical cancer ([place])   . Shingles     Social History   Social History  . Marital Status: Unknown    Spouse Name: N/A  . Number of Children: N/A  . Years of Education: N/A   Occupational History  . Not on file.   Social History Main Topics  . Smoking status: Former Smoker    Quit date: 11/29/1994  . Smokeless tobacco: Never Used  . Alcohol Use: Not on file  . Drug Use: Not on file  . Sexual Activity: Not on file   Other Topics Concern  . Not on file   Social History Narrative   Divorced.   1 son.    Moved from [place].   Enjoys making jewelry, spending time with family, crafts, reading.    Past Surgical History  Procedure Laterality Date  . Partial hysterectomy    . Rotator cuff repair Right     Family History  Problem Relation Age of Onset  . Emphysema Father   . Lung cancer Father   . Stroke Mother   . Hyperlipidemia Father   . Hyperlipidemia Sister     Allergies  Allergen Reactions  . Actifed Cold-Allergy [Chlorpheniramine-Phenylephrine]   . Compazine [Prochlorperazine]   . Penicillins     Current Outpatient Prescriptions on File Prior to Visit  Medication Sig Dispense Refill  . ALPRAZolam (XANAX) 0.5 MG tablet Take 0.5 mg by mouth at bedtime as needed for anxiety.    . citalopram (CELEXA) 20 MG tablet Take 20 mg by mouth  daily.  1  . Ginkgo Biloba 40 MG TABS Take 1 tablet by mouth daily.    . Misc Natural Products (OSTEO BI-FLEX JOINT SHIELD PO) Take 1 tablet by mouth daily.    . Multiple Vitamin (MULTIVITAMIN) tablet Take 1 tablet by mouth daily.    . permethrin (ELIMITE) 5 % cream Apply 1 application topically once. 60 g 0  . Potassium 75 MG TABS Take 1 tablet by mouth daily.     No current facility-administered medications on file prior to visit.    BP 124/74 mmHg  Pulse 67  Temp(Src) 98.2 F (36.8  C) (Oral)  Ht 5\' 6"  (1.676 m)  Wt 185 lb 12.8 oz (84.278 kg)  BMI 30.00 kg/m2  SpO2 97%    Objective:   Physical Exam  Constitutional: She is oriented to person, place, and time. She appears well-nourished.  HENT:  Right Ear: Tympanic membrane and ear canal normal.  Left Ear: Tympanic membrane and ear canal normal.  Nose: Nose normal.  Mouth/Throat: Oropharynx is clear and moist.  Eyes: Conjunctivae and EOM are normal. Pupils are equal, round, and reactive to light.  Neck: Neck supple. No thyromegaly present.  Cardiovascular: Normal rate and regular rhythm.   No murmur heard. Pulmonary/Chest: Effort normal and breath sounds normal. She has no rales.  Abdominal: Soft. Bowel sounds are normal. There is no tenderness.  Small, non-thrombosed, non bleeding external hemorrhoid noted. No internal hemorrhoids noted on exam.   Musculoskeletal: Normal range of motion.  Lymphadenopathy:    She has no cervical adenopathy.  Neurological: She is alert and oriented to person, place, and time. She has normal reflexes. No cranial nerve deficit.  Skin: Skin is warm and dry. No rash noted.  Psychiatric: She has a normal mood and affect.          Assessment & Plan:  Hemorrhoid:  Small, non thrombosed, non bleeding external hemorrhoid noted on exam. Discussed treatment, straining with bowel movements, etc. She is due for screening colonoscopy. Will have her notify me if bleeding returns and/or develops discomfort.

## 2015-04-05 NOTE — Assessment & Plan Note (Signed)
Level of 18 today. Will have her start supplementation 2000 units daily. Bone density testing ordered. Will recheck in 3 months.

## 2015-04-05 NOTE — Assessment & Plan Note (Signed)
Feels well managed on citalopram. Continue same.

## 2015-04-05 NOTE — Assessment & Plan Note (Signed)
Tdap due and provided today. Flu UTD. Mammogram, bone density testing, and colonoscopy orders placed today. Hysterectomy. Labs with significantly elevated trigs and borderline diabetes. Discussed the importance of a healthy diet and regular exercise in order for weight loss and to reduce risk of other medical diseases. Exam unremarkable. Repeat labs in 3 months.  Follow up in 6 months.

## 2015-04-05 NOTE — Progress Notes (Signed)
Pre visit review using our clinic review tool, if applicable. No additional management support is needed unless otherwise documented below in the visit note. 

## 2015-04-23 ENCOUNTER — Ambulatory Visit: Payer: 59

## 2015-04-24 ENCOUNTER — Other Ambulatory Visit: Payer: Self-pay | Admitting: Primary Care

## 2015-04-24 ENCOUNTER — Ambulatory Visit
Admission: RE | Admit: 2015-04-24 | Discharge: 2015-04-24 | Disposition: A | Discharge status: Home / Self Care | Payer: 59 | Source: Ambulatory Visit | Attending: Primary Care | Admitting: Primary Care

## 2015-04-24 DIAGNOSIS — M8580 Other specified disorders of bone density and structure, unspecified site: Secondary | ICD-10-CM | POA: Diagnosis not present

## 2015-04-24 DIAGNOSIS — Z1239 Encounter for other screening for malignant neoplasm of breast: Secondary | ICD-10-CM

## 2015-04-24 DIAGNOSIS — Z780 Asymptomatic menopausal state: Secondary | ICD-10-CM | POA: Diagnosis not present

## 2015-04-24 DIAGNOSIS — Z1231 Encounter for screening mammogram for malignant neoplasm of breast: Secondary | ICD-10-CM | POA: Diagnosis not present

## 2015-04-24 DIAGNOSIS — Z13820 Encounter for screening for osteoporosis: Secondary | ICD-10-CM | POA: Diagnosis not present

## 2015-04-24 DIAGNOSIS — M85852 Other specified disorders of bone density and structure, left thigh: Secondary | ICD-10-CM | POA: Diagnosis not present

## 2015-04-28 ENCOUNTER — Encounter: Payer: Self-pay | Admitting: Primary Care

## 2015-05-10 ENCOUNTER — Other Ambulatory Visit: Payer: Self-pay | Admitting: Primary Care

## 2015-05-10 DIAGNOSIS — E781 Pure hyperglyceridemia: Secondary | ICD-10-CM

## 2015-05-10 MED ORDER — EZETIMIBE 10 MG PO TABS: 10.0000 mg | ORAL_TABLET | Freq: Every day | ORAL | Status: DC

## 2015-05-10 NOTE — Telephone Encounter (Signed)
Received faxed refill request to change to [facility] mail order for   ezetimibe (ZETIA) 10 MG tablet  Last prescribed on 04/05/2015 to [facility]. Last seen on 04/05/2015. Lab follow up on 07/12/2015.

## 2015-05-14 ENCOUNTER — Ambulatory Visit: Payer: Self-pay | Admitting: Internal Medicine

## 2015-06-06 ENCOUNTER — Encounter: Payer: Self-pay | Admitting: Primary Care

## 2015-06-06 ENCOUNTER — Ambulatory Visit (INDEPENDENT_AMBULATORY_CARE_PROVIDER_SITE_OTHER): Payer: 59 | Admitting: Primary Care

## 2015-06-06 VITALS — BP 122/74 | HR 79 | Temp 98.6°F | Ht 66.0 in | Wt 188.8 lb

## 2015-06-06 DIAGNOSIS — R05 Cough: Secondary | ICD-10-CM | POA: Diagnosis not present

## 2015-06-06 DIAGNOSIS — R059 Cough, unspecified: Secondary | ICD-10-CM

## 2015-06-06 MED ORDER — METHYLPREDNISOLONE ACETATE 80 MG/ML IJ SUSP
80.0000 mg | Freq: Once | INTRAMUSCULAR | Status: AC
Start: 2015-06-06 — End: 2015-06-06
  Administered 2015-06-06: 80 mg via INTRAMUSCULAR

## 2015-06-06 NOTE — Progress Notes (Signed)
Pre visit review using our clinic review tool, if applicable. No additional management support is needed unless otherwise documented below in the visit note. 

## 2015-06-06 NOTE — Progress Notes (Signed)
Subjective:    Patient ID: Maria Sloan, female    DOB: 08-05-1950, 65 y.o.   MRN: [id number]  HPI  Maria Sloan is a 65 year old female who presents today with a chief complaint of cough. She also reports sore throat, sinus pressure, sneezing, and nasal congestion. Her symptoms have been present for the past 2-3 weeks. Overall she's not experienced any improvement in her symptoms and is not feel ill. She does describe her eyes feeling "puffy" at times. She's taken Delsym, Mucinex, and cough drops with temporary impveoment. Her cough is non productive. Denies fevers, body aches.  Review of Systems  Constitutional: Negative for fever, chills and fatigue.  HENT: Positive for congestion, sinus pressure, sneezing and sore throat. Negative for ear pain and facial swelling.   Eyes: Negative for itching.  Respiratory: Positive for cough. Negative for shortness of breath.   Cardiovascular: Negative for chest pain.  Musculoskeletal: Negative for myalgias.       Past Medical History  Diagnosis Date  . PVD (peripheral vascular disease) ([place])   . Type 2 diabetes mellitus ([place])   . Anxiety and depression   . Cervical cancer ([place])   . Shingles     Social History   Social History  . Marital Status: Unknown    Spouse Name: N/A  . Number of Children: N/A  . Years of Education: N/A   Occupational History  . Not on file.   Social History Main Topics  . Smoking status: Former Smoker    Quit date: 11/29/1994  . Smokeless tobacco: Never Used  . Alcohol Use: Not on file  . Drug Use: Not on file  . Sexual Activity: Not on file   Other Topics Concern  . Not on file   Social History Narrative   Divorced.   1 son.    Moved from [place].   Enjoys making jewelry, spending time with family, crafts, reading.    Past Surgical History  Procedure Laterality Date  . Partial hysterectomy    . Rotator cuff repair Right     Family History  Problem Relation Age of Onset  . Emphysema Father   .  Lung cancer Father   . Stroke Mother   . Hyperlipidemia Father   . Hyperlipidemia Sister     Allergies  Allergen Reactions  . Actifed Cold-Allergy [Chlorpheniramine-Phenylephrine]   . Compazine [Prochlorperazine]   . Penicillins     Current Outpatient Prescriptions on File Prior to Visit  Medication Sig Dispense Refill  . ALPRAZolam (XANAX) 0.5 MG tablet Take 0.5 mg by mouth at bedtime as needed for anxiety.    . citalopram (CELEXA) 20 MG tablet Take 20 mg by mouth daily.  1  . ezetimibe (ZETIA) 10 MG tablet Take 1 tablet (10 mg total) by mouth daily. 90 tablet 3  . Ginkgo Biloba 40 MG TABS Take 1 tablet by mouth daily.    . Misc Natural Products (OSTEO BI-FLEX JOINT SHIELD PO) Take 1 tablet by mouth daily.    . Multiple Vitamin (MULTIVITAMIN) tablet Take 1 tablet by mouth daily.    . Potassium 75 MG TABS Take 1 tablet by mouth daily.     No current facility-administered medications on file prior to visit.    BP 122/74 mmHg  Pulse 79  Temp(Src) 98.6 F (37 C) (Oral)  Ht 5\' 6"  (1.676 m)  Wt 188 lb 12.8 oz (85.639 kg)  BMI 30.49 kg/m2  SpO2 97%    Objective:   Physical  Exam  Constitutional: She appears well-nourished.  HENT:  Right Ear: Tympanic membrane and ear canal normal.  Left Ear: Tympanic membrane and ear canal normal.  Nose: Mucosal edema present. Right sinus exhibits no maxillary sinus tenderness and no frontal sinus tenderness. Left sinus exhibits no maxillary sinus tenderness and no frontal sinus tenderness.  Mouth/Throat: Oropharynx is clear and moist.  Eyes: Conjunctivae are normal.  Neck: Neck supple.  Cardiovascular: Normal rate and regular rhythm.   Pulmonary/Chest: Effort normal and breath sounds normal. She has no wheezes. She has no rales.  Lymphadenopathy:    She has no cervical adenopathy.  Skin: Skin is warm and dry.          Assessment & Plan:  Cough secondary to Allergic Rhinitis:  Cough, nasal congestion, sneezing x 2-3 weeks. Has  not tried antihistamines or nasal sprays. Exam with clear lungs and mostly unremarkable HENT exam. She does not appear ill or acutely infectious. Vitals WNL. Suspect allergy involvement and will treat with supportive measures. Start Zyrtec at bedtime, Flonase every morning. IM depo provided in office today. Return precautions provided.

## 2015-06-06 NOTE — Patient Instructions (Signed)
Start Zyrtec antihistamine every night at bedtime for the next 2-3 weeks.  Nasal Congestion: Try using Flonase (fluticasone) nasal spray. Instill 2 sprays in each nostril once daily every morning.   Cough: Delsym DM or Robitussin DM as needed for cough.  Please notify me if you develop persistent fevers of 101, start coughing up green mucous, notice increased fatigue or weakness.  Increase consumption of water intake and rest.  It was a pleasure to see you today!

## 2015-07-12 ENCOUNTER — Telehealth: Payer: Self-pay | Admitting: Family Medicine

## 2015-07-12 ENCOUNTER — Other Ambulatory Visit (INDEPENDENT_AMBULATORY_CARE_PROVIDER_SITE_OTHER): Payer: 59

## 2015-07-12 DIAGNOSIS — R7303 Prediabetes: Secondary | ICD-10-CM

## 2015-07-12 DIAGNOSIS — E781 Pure hyperglyceridemia: Secondary | ICD-10-CM | POA: Diagnosis not present

## 2015-07-12 DIAGNOSIS — E559 Vitamin D deficiency, unspecified: Secondary | ICD-10-CM

## 2015-07-12 LAB — COMPREHENSIVE METABOLIC PANEL
ALK PHOS: 61 U/L (ref 39–117)
ALT: 33 U/L (ref 0–35)
AST: 29 U/L (ref 0–37)
Albumin: 4.4 g/dL (ref 3.5–5.2)
BILIRUBIN TOTAL: 0.5 mg/dL (ref 0.2–1.2)
BUN: 17 mg/dL (ref 6–23)
CO2: 29 meq/L (ref 19–32)
Calcium: 9.4 mg/dL (ref 8.4–10.5)
Chloride: 101 mEq/L (ref 96–112)
Creatinine, Ser: 0.71 mg/dL (ref 0.40–1.20)
GFR: 87.75 mL/min (ref >60.00)
GLUCOSE: 152 mg/dL — AB (ref 70–99)
Potassium: 4.2 mEq/L (ref 3.5–5.1)
SODIUM: 136 meq/L (ref 135–145)
TOTAL PROTEIN: 7.1 g/dL (ref 6.0–8.3)

## 2015-07-12 LAB — LIPID PANEL
Cholesterol: 234 mg/dL — ABNORMAL HIGH (ref 0–200)
HDL: 40.9 mg/dL (ref >39.00)
Total CHOL/HDL Ratio: 6

## 2015-07-12 LAB — HEMOGLOBIN A1C: Hgb A1c MFr Bld: 6.4 % (ref 4.6–6.5)

## 2015-07-12 LAB — LDL CHOLESTEROL, DIRECT: LDL DIRECT: 136 mg/dL

## 2015-07-12 NOTE — Telephone Encounter (Signed)
-----   Message from Ellamae Sia sent at 07/08/2015  4:07 PM EDT ----- Regarding: Lab orders for Friday, 5.12.17 Lab orders for a 3 month follow up appt.

## 2015-10-29 ENCOUNTER — Ambulatory Visit: Payer: Self-pay | Admitting: Primary Care

## 2015-11-11 ENCOUNTER — Ambulatory Visit: Payer: Self-pay | Admitting: Primary Care

## 2015-11-18 ENCOUNTER — Ambulatory Visit (INDEPENDENT_AMBULATORY_CARE_PROVIDER_SITE_OTHER): Payer: 59 | Admitting: Primary Care

## 2015-11-18 ENCOUNTER — Encounter: Payer: Self-pay | Admitting: Primary Care

## 2015-11-18 VITALS — BP 118/72 | HR 70 | Temp 98.0°F | Ht 66.0 in | Wt 193.8 lb

## 2015-11-18 DIAGNOSIS — R7303 Prediabetes: Secondary | ICD-10-CM

## 2015-11-18 DIAGNOSIS — F418 Other specified anxiety disorders: Secondary | ICD-10-CM

## 2015-11-18 DIAGNOSIS — E781 Pure hyperglyceridemia: Secondary | ICD-10-CM

## 2015-11-18 DIAGNOSIS — F329 Major depressive disorder, single episode, unspecified: Secondary | ICD-10-CM

## 2015-11-18 DIAGNOSIS — E559 Vitamin D deficiency, unspecified: Secondary | ICD-10-CM

## 2015-11-18 DIAGNOSIS — Z1159 Encounter for screening for other viral diseases: Secondary | ICD-10-CM

## 2015-11-18 DIAGNOSIS — F419 Anxiety disorder, unspecified: Principal | ICD-10-CM

## 2015-11-18 DIAGNOSIS — F32A Depression, unspecified: Secondary | ICD-10-CM

## 2015-11-18 MED ORDER — CITALOPRAM HYDROBROMIDE 20 MG PO TABS: 20.0000 mg | ORAL_TABLET | Freq: Every day | ORAL | 3 refills | Status: DC

## 2015-11-18 MED ORDER — EZETIMIBE 10 MG PO TABS: 10.0000 mg | ORAL_TABLET | Freq: Every day | ORAL | 3 refills | Status: DC

## 2015-11-18 NOTE — Assessment & Plan Note (Signed)
Out of Zetia x 1 week, refill sent today. Lipids pending.

## 2015-11-18 NOTE — Assessment & Plan Note (Signed)
A1C of 6.4 on labs in May, due for recheck today. Emphasized importance of healthy diet and exercise. Urine microalbumin pending. Foot exam unremarkable today.

## 2015-11-18 NOTE — Patient Instructions (Signed)
I sent refills for Celexa and Zetia through [employer].  Schedule a lab only appointment up front for your labs. Ensure you are fasting 4 hours prior to this appointment. You may have water and black coffee only.  I will be in touch with you once I receive your results.  It is important that you improve your diet. Please limit carbohydrates in the form of white bread, rice, pasta, cakes, cookies, sugary drinks, etc. Increase your consumption of fresh fruits and vegetables, whole grains, lean protein.  Ensure you are consuming 64 ounces of water daily.  Start exercising. You should be getting 1 hour of moderate intensity exercise 3-5 days weekly.  Follow up in February 2018 for your annual physical exam.  It was a pleasure to see you today!  Food Choices to Lower Your Triglycerides Triglycerides are a type of fat in your blood. High levels of triglycerides can increase the risk of heart disease and stroke. If your triglyceride levels are high, the foods you eat and your eating habits are very important. Choosing the right foods can help lower your triglycerides.  WHAT GENERAL GUIDELINES DO I NEED TO FOLLOW?  Lose weight if you are overweight.   Limit or avoid alcohol.   Fill one half of your plate with vegetables and green salads.   Limit fruit to two servings a day. Choose fruit instead of juice.   Make one fourth of your plate whole grains. Look for the word "whole" as the first word in the ingredient list.  Fill one fourth of your plate with lean protein foods.  Enjoy fatty fish (such as salmon, mackerel, sardines, and tuna) three times a week.   Choose healthy fats.   Limit foods high in starch and sugar.  Eat more home-cooked food and less restaurant, buffet, and fast food.  Limit fried foods.  Cook foods using methods other than frying.  Limit saturated fats.  Check ingredient lists to avoid foods with partially hydrogenated oils (trans fats) in  them. WHAT FOODS CAN I EAT?  Grains Whole grains, such as whole wheat or whole grain breads, crackers, cereals, and pasta. Unsweetened oatmeal, bulgur, barley, quinoa, or brown rice. Corn or whole wheat flour tortillas.  Vegetables Fresh or frozen vegetables (raw, steamed, roasted, or grilled). Green salads. Fruits All fresh, canned (in natural juice), or frozen fruits. Meat and Other Protein Products Ground beef (85% or leaner), grass-fed beef, or beef trimmed of fat. Skinless chicken or [place]. Ground chicken or [place]. Pork trimmed of fat. All fish and seafood. Eggs. Dried beans, peas, or lentils. Unsalted nuts or seeds. Unsalted canned or dry beans. Dairy Low-fat dairy products, such as skim or 1% milk, 2% or reduced-fat cheeses, low-fat ricotta or cottage cheese, or plain low-fat yogurt. Fats and Oils Tub margarines without trans fats. Light or reduced-fat mayonnaise and salad dressings. Avocado. Safflower, olive, or canola oils. Natural peanut or almond butter. The items listed above may not be a complete list of recommended foods or beverages. Contact your dietitian for more options. WHAT FOODS ARE NOT RECOMMENDED?  Grains White bread. White pasta. White rice. Cornbread. Bagels, pastries, and croissants. Crackers that contain trans fat. Vegetables White potatoes. Corn. Creamed or fried vegetables. Vegetables in a cheese sauce. Fruits Dried fruits. Canned fruit in light or heavy syrup. Fruit juice. Meat and Other Protein Products Fatty cuts of meat. Ribs, chicken wings, bacon, sausage, bologna, salami, chitterlings, fatback, hot dogs, bratwurst, and packaged luncheon meats. Dairy Whole or 2%  milk, cream, half-and-half, and cream cheese. Whole-fat or sweetened yogurt. Full-fat cheeses. Nondairy creamers and whipped toppings. Processed cheese, cheese spreads, or cheese curds. Sweets and Desserts Corn syrup, sugars, honey, and molasses. Candy. Jam and jelly. Syrup. Sweetened cereals.  Cookies, pies, cakes, donuts, muffins, and ice cream. Fats and Oils Butter, stick margarine, lard, shortening, ghee, or bacon fat. Coconut, palm kernel, or palm oils. Beverages Alcohol. Sweetened drinks (such as sodas, lemonade, and fruit drinks or punches). The items listed above may not be a complete list of foods and beverages to avoid. Contact your dietitian for more information.   This information is not intended to replace advice given to you by your health care provider. Make sure you discuss any questions you have with your health care provider.   Document Released: 12/05/2003 Document Revised: 03/09/2014 Document Reviewed: 12/21/2012 Elsevier Interactive Patient Education [employer].

## 2015-11-18 NOTE — Progress Notes (Signed)
Pre visit review using our clinic review tool, if applicable. No additional management support is needed unless otherwise documented below in the visit note. 

## 2015-11-18 NOTE — Progress Notes (Addendum)
Subjective:    Patient ID: Maria Sloan, female    DOB: [birth year], 65 y.o.   MRN: [id number]  HPI  Maria Sloan is a 65 year old female who presents today for medication refill and health maintenance. She had a complete physical in February 2017. She is requesting Hep C screening.  1) Hyperlipidemia: Lipids above goal in May 2017, especially Triglycerides. Currently managed on Zetia and Fish Oil 1000 mg three time daily. Denies myalgias. She is due for recheck.  2) Borderline Diabetes: A1C in May 2017 at 6.4 which was an increased from 5.9 in January 2017. She does experience transient numbness to her hands when driving. Denies polyuria, polydipsia, changes in vision. Her last eye exam was several years ago.   Her diet consists of: Breakfast: Egg substitute, veggie patty, vegetables Lunch: Salad with protein, rice cake, cheese Dinner: Bean hamburger, bread, fruit Snacks: Cookies, ice cream, chips Desserts: Daily Beverages: Coffee, flavored water, occasional diet soda  Exercise: She does not currently exercise.    3) Anxiety and Depression: Currently managed on Celexa 20 mg. She does have a prescription for Alprazolam but has not used in nearly one year. Denies SI/HI. Overall she feels well managed on Citalopram.  Review of Systems  Respiratory: Negative for shortness of breath.   Cardiovascular: Negative for chest pain.  Endocrine: Negative for polyuria.  Musculoskeletal: Negative for myalgias.  Neurological: Positive for numbness. Negative for dizziness and headaches.  Psychiatric/Behavioral: Negative for sleep disturbance and suicidal ideas. The patient is not nervous/anxious.        Past Medical History:  Diagnosis Date  . Anxiety and depression   . Cervical cancer ([place])   . PVD (peripheral vascular disease) ([place])   . Shingles   . Type 2 diabetes mellitus ([place])      Social History   Social History  . Marital status: Unknown    Spouse name: N/A  . Number of  children: N/A  . Years of education: N/A   Occupational History  . Not on file.   Social History Main Topics  . Smoking status: Former Smoker    Quit date: 11/29/1994  . Smokeless tobacco: Never Used  . Alcohol use Not on file  . Drug use: Unknown  . Sexual activity: Not on file   Other Topics Concern  . Not on file   Social History Narrative   Divorced.   1 son.    Moved from [place].   Enjoys making jewelry, spending time with family, crafts, reading.    Past Surgical History:  Procedure Laterality Date  . PARTIAL HYSTERECTOMY    . ROTATOR CUFF REPAIR Right     Family History  Problem Relation Age of Onset  . Emphysema Father   . Lung cancer Father   . Stroke Mother   . Hyperlipidemia Father   . Hyperlipidemia Sister     Allergies  Allergen Reactions  . Actifed Cold-Allergy [Chlorpheniramine-Phenylephrine]   . Compazine [Prochlorperazine]   . Penicillins     Current Outpatient Prescriptions on File Prior to Visit  Medication Sig Dispense Refill  . Ginkgo Biloba 40 MG TABS Take 1 tablet by mouth daily.    . Misc Natural Products (OSTEO BI-FLEX JOINT SHIELD PO) Take 1 tablet by mouth daily.    . Multiple Vitamin (MULTIVITAMIN) tablet Take 1 tablet by mouth daily.    . Potassium 75 MG TABS Take 1 tablet by mouth daily.    Marland Kitchen ALPRAZolam (XANAX) 0.5 MG tablet Take 0.5  mg by mouth at bedtime as needed for anxiety.     No current facility-administered medications on file prior to visit.     BP 118/72   Pulse 70   Temp 98 F (36.7 C) (Oral)   Ht 5\' 6"  (1.676 m)   Wt 193 lb 12.8 oz (87.9 kg)   SpO2 93%   BMI 31.28 kg/m    Objective:   Physical Exam  Constitutional: She appears well-nourished.  Neck: Neck supple.  Cardiovascular: Normal rate and regular rhythm.   Pulmonary/Chest: Effort normal and breath sounds normal.  Skin: Skin is warm and dry.  Psychiatric: She has a normal mood and affect.          Assessment & Plan:

## 2015-11-18 NOTE — Assessment & Plan Note (Addendum)
Stable on celexa. Continue same. Discouraged use of alprazolam and discussed that I would not be refilling.

## 2015-12-13 ENCOUNTER — Ambulatory Visit (INDEPENDENT_AMBULATORY_CARE_PROVIDER_SITE_OTHER): Payer: 59

## 2015-12-13 ENCOUNTER — Other Ambulatory Visit (INDEPENDENT_AMBULATORY_CARE_PROVIDER_SITE_OTHER): Payer: 59

## 2015-12-13 DIAGNOSIS — Z23 Encounter for immunization: Secondary | ICD-10-CM

## 2015-12-13 DIAGNOSIS — R7303 Prediabetes: Secondary | ICD-10-CM | POA: Diagnosis not present

## 2015-12-13 DIAGNOSIS — Z1159 Encounter for screening for other viral diseases: Secondary | ICD-10-CM | POA: Diagnosis not present

## 2015-12-13 DIAGNOSIS — E559 Vitamin D deficiency, unspecified: Secondary | ICD-10-CM | POA: Diagnosis not present

## 2015-12-13 DIAGNOSIS — E781 Pure hyperglyceridemia: Secondary | ICD-10-CM

## 2015-12-13 LAB — MICROALBUMIN / CREATININE URINE RATIO
CREATININE, U: 153.6 mg/dL
MICROALB/CREAT RATIO: 0.8 mg/g (ref 0.0–30.0)
Microalb, Ur: 1.3 mg/dL (ref 0.0–1.9)

## 2015-12-13 LAB — LDL CHOLESTEROL, DIRECT: LDL DIRECT: 115 mg/dL

## 2015-12-13 LAB — VITAMIN D 25 HYDROXY (VIT D DEFICIENCY, FRACTURES): VITD: 33.68 ng/mL (ref 30.00–100.00)

## 2015-12-13 LAB — LIPID PANEL
Cholesterol: 215 mg/dL — ABNORMAL HIGH (ref 0–200)
HDL: 37 mg/dL — ABNORMAL LOW (ref >39.00)
Total CHOL/HDL Ratio: 6
Triglycerides: 476 mg/dL — ABNORMAL HIGH (ref 0.0–149.0)

## 2015-12-13 LAB — HEMOGLOBIN A1C: Hgb A1c MFr Bld: 6.8 % — ABNORMAL HIGH (ref 4.6–6.5)

## 2015-12-14 LAB — HEPATITIS C ANTIBODY: HCV Ab: NEGATIVE

## 2015-12-25 ENCOUNTER — Telehealth: Payer: Self-pay | Admitting: Primary Care

## 2015-12-25 ENCOUNTER — Other Ambulatory Visit: Payer: Self-pay | Admitting: Primary Care

## 2015-12-25 ENCOUNTER — Encounter: Payer: Self-pay | Admitting: Primary Care

## 2015-12-25 DIAGNOSIS — E782 Mixed hyperlipidemia: Secondary | ICD-10-CM

## 2015-12-25 MED ORDER — ATORVASTATIN CALCIUM 20 MG PO TABS: 20.0000 mg | ORAL_TABLET | Freq: Every day | ORAL | 1 refills | Status: DC

## 2015-12-25 NOTE — Telephone Encounter (Signed)
Please call and schedule patient for repeat lipid panel in 3 months. Please ensure she comes fasting for 4 hours.

## 2015-12-25 NOTE — Telephone Encounter (Signed)
Message left for patient to return my call.  

## 2015-12-27 NOTE — Telephone Encounter (Signed)
Message left for patient to return my call.  

## 2016-03-13 ENCOUNTER — Other Ambulatory Visit: Payer: Self-pay

## 2016-03-13 ENCOUNTER — Encounter: Payer: Self-pay | Admitting: Family Medicine

## 2016-03-13 ENCOUNTER — Ambulatory Visit (INDEPENDENT_AMBULATORY_CARE_PROVIDER_SITE_OTHER): Payer: 59 | Admitting: Family Medicine

## 2016-03-13 VITALS — BP 100/70 | HR 70 | Wt 194.0 lb

## 2016-03-13 DIAGNOSIS — F329 Major depressive disorder, single episode, unspecified: Secondary | ICD-10-CM

## 2016-03-13 DIAGNOSIS — E781 Pure hyperglyceridemia: Secondary | ICD-10-CM

## 2016-03-13 DIAGNOSIS — F419 Anxiety disorder, unspecified: Principal | ICD-10-CM

## 2016-03-13 DIAGNOSIS — J069 Acute upper respiratory infection, unspecified: Secondary | ICD-10-CM | POA: Diagnosis not present

## 2016-03-13 DIAGNOSIS — F32A Depression, unspecified: Secondary | ICD-10-CM

## 2016-03-13 MED ORDER — CITALOPRAM HYDROBROMIDE 20 MG PO TABS: 20.0000 mg | ORAL_TABLET | Freq: Every day | ORAL | 0 refills | Status: DC

## 2016-03-13 MED ORDER — DOXYCYCLINE HYCLATE 100 MG PO CAPS: 100.0000 mg | ORAL_CAPSULE | Freq: Two times a day (BID) | ORAL | 0 refills | Status: DC

## 2016-03-13 MED ORDER — EZETIMIBE 10 MG PO TABS: 10.0000 mg | ORAL_TABLET | Freq: Every day | ORAL | 0 refills | Status: DC

## 2016-03-13 MED ORDER — HYDROCODONE-HOMATROPINE 5-1.5 MG/5ML PO SYRP: 5.0000 mL | ORAL_SOLUTION | Freq: Three times a day (TID) | ORAL | 0 refills | Status: DC | PRN

## 2016-03-13 NOTE — Telephone Encounter (Signed)
Pt changed ins at first of yr and request #30 of celexa and zetia to [facility] until get MO. Advised pt done.

## 2016-03-13 NOTE — Patient Instructions (Signed)
For nasal congestion you can use Afrin nasal spray for 3 days max, Sudafed, saline nasal spray (generic is fine for all). Drink enough fluids to make your urine light yellow. For fever/chill/muscle aches you can take over the counter acetaminophen or ibuprofen.  If not better in 3-5 days, start antibiotic Please come back in if you are not better in 5-7 days or if you develop wheezing, shortness of breath or persistent vomiting.

## 2016-03-13 NOTE — Progress Notes (Signed)
Subjective:    Patient ID: Maria Sloan, female    DOB: 11/30/1950, 66 y.o.   MRN: [id number]  HPI This is a 65 yo female who presents today with cough and sore throat x 5 days. Cough is dry, feels SOB with exertion, no wheeze. Wakes up from sleep coughing. No fever. Some post nasal drainage, no feeling of congestion, no ear pain. No myalgias. Took Mucinex last night without relief. Feels worse today than she did yesterday. Some sick contacts- grandkids.  No history of asthma, has seasonal allergies, distant smoking history.   Past Medical History:  Diagnosis Date  . Anxiety and depression   . Cervical cancer ([place])   . PVD (peripheral vascular disease) ([place])   . Shingles   . Type 2 diabetes mellitus ([place])    Past Surgical History:  Procedure Laterality Date  . PARTIAL HYSTERECTOMY    . ROTATOR CUFF REPAIR Right    Family History  Problem Relation Age of Onset  . Emphysema Father   . Lung cancer Father   . Stroke Mother   . Hyperlipidemia Father   . Hyperlipidemia Sister    Social History  Substance Use Topics  . Smoking status: Former Smoker    Quit date: 11/29/1994  . Smokeless tobacco: Never Used  . Alcohol use Not on file      Review of Systems Per HPI    Objective:   Physical Exam  Constitutional: She is oriented to person, place, and time. She appears well-developed and well-nourished. She appears ill. No distress.  HENT:  Head: Normocephalic and atraumatic.  Right Ear: Tympanic membrane, external ear and ear canal normal.  Left Ear: Tympanic membrane, external ear and ear canal normal.  Nose: Mucosal edema and rhinorrhea present.  Mouth/Throat: Uvula is midline. Posterior oropharyngeal erythema present. No oropharyngeal exudate or posterior oropharyngeal edema.  Eyes: Conjunctivae are normal.  Neck: Normal range of motion. Neck supple.  Cardiovascular: Normal rate, regular rhythm and normal heart sounds.   Pulmonary/Chest: Effort normal. No respiratory  distress. She has no wheezes. She has no rales.  Few scattered rhonchi.   Lymphadenopathy:    She has no cervical adenopathy.  Neurological: She is alert and oriented to person, place, and time.  Skin: Skin is warm and dry. She is not diaphoretic.  Psychiatric: She has a normal mood and affect. Her behavior is normal. Judgment and thought content normal.  Vitals reviewed.     BP 100/70   Pulse 70   Wt 194 lb (88 kg)   SpO2 98%   BMI 31.31 kg/m  Wt Readings from Last 3 Encounters:  03/13/16 194 lb (88 kg)  11/18/15 193 lb 12.8 oz (87.9 kg)  06/06/15 188 lb 12.8 oz (85.6 kg)       Assessment & Plan:  1. Acute upper respiratory infection - likely viral, provided wait and see prescription for antibiotic if not better in 3-5 days - Provided written and verbal information regarding diagnosis and treatment. -  Patient Instructions  For nasal congestion you can use Afrin nasal spray for 3 days max, Sudafed, saline nasal spray (generic is fine for all). Drink enough fluids to make your urine light yellow. For fever/chill/muscle aches you can take over the counter acetaminophen or ibuprofen.  If not better in 3-5 days, start antibiotic Please come back in if you are not better in 5-7 days or if you develop wheezing, shortness of breath or persistent vomiting.   - HYDROcodone-homatropine (HYCODAN) 5-1.5 MG/5ML  syrup; Take 5 mLs by mouth every 8 (eight) hours as needed for cough.  Dispense: 120 mL; Refill: 0 - doxycycline (VIBRAMYCIN) 100 MG capsule; Take 1 capsule (100 mg total) by mouth 2 (two) times daily.  Dispense: 14 capsule; Refill: 0   Clarene Reamer, FNP-BC   Primary Care at [facility], [facility]  03/13/2016 2:52 PM

## 2016-03-31 ENCOUNTER — Other Ambulatory Visit: Payer: Self-pay | Admitting: Primary Care

## 2016-03-31 ENCOUNTER — Encounter: Payer: Self-pay | Admitting: Primary Care

## 2016-03-31 DIAGNOSIS — E781 Pure hyperglyceridemia: Secondary | ICD-10-CM

## 2016-03-31 DIAGNOSIS — F32A Depression, unspecified: Secondary | ICD-10-CM

## 2016-03-31 DIAGNOSIS — F329 Major depressive disorder, single episode, unspecified: Secondary | ICD-10-CM

## 2016-03-31 DIAGNOSIS — F419 Anxiety disorder, unspecified: Secondary | ICD-10-CM

## 2016-03-31 MED ORDER — CITALOPRAM HYDROBROMIDE 20 MG PO TABS: 20.0000 mg | ORAL_TABLET | Freq: Every day | ORAL | 2 refills | Status: DC

## 2016-03-31 MED ORDER — EZETIMIBE 10 MG PO TABS
10.0000 mg | ORAL_TABLET | Freq: Every day | ORAL | 2 refills | Status: DC
Start: 2016-03-31 — End: 2017-12-31

## 2016-04-07 ENCOUNTER — Encounter: Payer: Self-pay | Admitting: Primary Care

## 2016-04-07 ENCOUNTER — Ambulatory Visit (INDEPENDENT_AMBULATORY_CARE_PROVIDER_SITE_OTHER): Payer: 59 | Admitting: Primary Care

## 2016-04-07 VITALS — BP 110/68 | HR 73 | Temp 98.0°F | Ht 66.0 in | Wt 190.8 lb

## 2016-04-07 DIAGNOSIS — R112 Nausea with vomiting, unspecified: Secondary | ICD-10-CM

## 2016-04-07 NOTE — Progress Notes (Signed)
Pre visit review using our clinic review tool, if applicable. No additional management support is needed unless otherwise documented below in the visit note. 

## 2016-04-07 NOTE — Progress Notes (Signed)
Subjective:    Patient ID: Maria Sloan, female    DOB: Nov 07, 1950, 66 y.o.   MRN: [id number]  HPI  [name] is a 66 year old female who presents today with a chief complaint of fever. She also reports nausea, vomiting, chills. Her fevers are running around 101 with a temperature of 98.7 today. Her symptoms began suddenly yesterday while at work. She vomited several times after leaving work. She woke up today feeling fatigued with nausea and "queasy stomach". Her temperatures are coming down today. She denies vomiting today and diarrhea since her symptoms began. She was around her grandson this past weekend who had the same symptoms. She's been able to tolerate fluids and solids without vomiting. Overall she's feeling a little better.   Review of Systems  Constitutional: Positive for chills, fatigue and fever.  HENT: Negative for congestion.   Respiratory: Negative for cough.   Gastrointestinal: Positive for abdominal pain, nausea and vomiting. Negative for diarrhea.       Past Medical History:  Diagnosis Date  . Anxiety and depression   . Cervical cancer ([place])   . PVD (peripheral vascular disease) ([place])   . Shingles   . Type 2 diabetes mellitus ([place])      Social History   Social History  . Marital status: Unknown    Spouse name: N/A  . Number of children: N/A  . Years of education: N/A   Occupational History  . Not on file.   Social History Main Topics  . Smoking status: Former Smoker    Quit date: 11/29/1994  . Smokeless tobacco: Never Used  . Alcohol use Not on file  . Drug use: Unknown  . Sexual activity: Not on file   Other Topics Concern  . Not on file   Social History Narrative   Divorced.   1 son.    Moved from [place].   Enjoys making jewelry, spending time with family, crafts, reading.    Past Surgical History:  Procedure Laterality Date  . PARTIAL HYSTERECTOMY    . ROTATOR CUFF REPAIR Right     Family History  Problem Relation Age of Onset  .  Emphysema Father   . Lung cancer Father   . Stroke Mother   . Hyperlipidemia Father   . Hyperlipidemia Sister     Allergies  Allergen Reactions  . Actifed Cold-Allergy [Chlorpheniramine-Phenylephrine]   . Compazine [Prochlorperazine]   . Penicillins     Current Outpatient Prescriptions on File Prior to Visit  Medication Sig Dispense Refill  . atorvastatin (LIPITOR) 20 MG tablet Take 1 tablet (20 mg total) by mouth daily. 90 tablet 1  . CALCIUM CITRATE PO Take by mouth.    . Cholecalciferol (VITAMIN D PO) Take by mouth.    . citalopram (CELEXA) 20 MG tablet Take 1 tablet (20 mg total) by mouth daily. 90 tablet 2  . ezetimibe (ZETIA) 10 MG tablet Take 1 tablet (10 mg total) by mouth daily. 90 tablet 2  . Ginkgo Biloba 40 MG TABS Take 1 tablet by mouth daily.    . Misc Natural Products (OSTEO BI-FLEX JOINT SHIELD PO) Take 1 tablet by mouth daily.    . Multiple Vitamin (MULTIVITAMIN) tablet Take 1 tablet by mouth daily.    . Omega-3 Fatty Acids (FISH OIL) 1000 MG CAPS Take 1,000 mg by mouth 3 (three) times daily.    . Potassium 75 MG TABS Take 1 tablet by mouth daily.     No current facility-administered medications  on file prior to visit.     BP 110/68   Pulse 73   Temp 98 F (36.7 C) (Oral)   Ht 5\' 6"  (1.676 m)   Wt 190 lb 12.8 oz (86.5 kg)   SpO2 96%   BMI 30.80 kg/m    Objective:   Physical Exam  Constitutional: She appears well-nourished. She does not appear ill.  Neck: Neck supple.  Cardiovascular: Normal rate and regular rhythm.   Pulmonary/Chest: Effort normal and breath sounds normal.  Abdominal: Soft. Normal appearance and bowel sounds are normal. There is generalized tenderness.  Skin: Skin is warm and dry.          Assessment & Plan:  Viral Illness:  Body aches, nausea, vomiting, chills, fevers x 24 hours. Overall feeling better without vomiting today. Exam today stable, vitals stable, no acute distress or signs of bacterial involvement. Suspect GI  viral illness and will treat with supportive measures. Rx for Zofran offered for which she kindly declined. Discussed importance of hydration and to advance diet as tolerated. Return precautions provided.  Sheral Flow, NP

## 2016-04-07 NOTE — Patient Instructions (Signed)
Your symptoms are representative of a viral illness which will resolve on its own over time. Our goal is to treat your symptoms in order to aid your body in the healing process and to make you more comfortable.   Ensure you are staying hydrated with water and advance your diet as tolerated.  Please call me if your fevers return, you start to feel worse, you can't keep liquids or solids down.  It was a pleasure to see you today!   Bland Diet Introduction A bland diet consists of foods that do not have a lot of fat or fiber. Foods without fat or fiber are easier for the body to digest. They are also less likely to irritate your mouth, throat, stomach, and other parts of your gastrointestinal tract. A bland diet is sometimes called a BRAT diet. What is my plan? Your health care provider or dietitian may recommend specific changes to your diet to prevent and treat your symptoms, such as:  Eating small meals often.  Cooking food until it is soft enough to chew easily.  Chewing your food well.  Drinking fluids slowly.  Not eating foods that are very spicy, sour, or fatty.  Not eating citrus fruits, such as oranges and grapefruit. What do I need to know about this diet?  Eat a variety of foods from the bland diet food list.  Do not follow a bland diet longer than you have to.  Ask your health care provider whether you should take vitamins. What foods can I eat? Grains  Hot cereals, such as cream of wheat. Bread, crackers, or tortillas made from refined white flour. Rice. Vegetables  Canned or cooked vegetables. Mashed or boiled potatoes. Fruits  Bananas. Applesauce. Other types of cooked or canned fruit with the skin and seeds removed, such as canned peaches or pears. Meats and Other Protein Sources  Scrambled eggs. Creamy peanut butter or other nut butters. Lean, well-cooked meats, such as chicken or fish. Tofu. Soups or broths. Dairy  Low-fat dairy products, such as milk,  cottage cheese, or yogurt. Beverages  Water. Herbal tea. Apple juice. Sweets and Desserts  Pudding. Custard. Fruit gelatin. Ice cream. Fats and Oils  Mild salad dressings. Canola or olive oil. The items listed above may not be a complete list of allowed foods or beverages. Contact your dietitian for more options.  What foods are not recommended? Foods and ingredients that are often not recommended include:  Spicy foods, such as hot sauce or salsa.  Fried foods.  Sour foods, such as pickled or fermented foods.  Raw vegetables or fruits, especially citrus or berries.  Caffeinated drinks.  Alcohol.  Strongly flavored seasonings or condiments. The items listed above may not be a complete list of foods and beverages that are not allowed. Contact your dietitian for more information.  This information is not intended to replace advice given to you by your health care provider. Make sure you discuss any questions you have with your health care provider. Document Released: 06/10/2015 Document Revised: 07/25/2015 Document Reviewed: 02/28/2014  2017 Elsevier

## 2016-04-14 MED FILL — CITALOPRAM HBR 20 MG TABLET: 20 | 90 days supply | Qty: 90 | Fill #0

## 2016-04-14 MED FILL — EZETIMIBE 10 MG TABLET: 10 | 90 days supply | Qty: 90 | Fill #0

## 2016-04-14 MED FILL — ATORVASTATIN 20 MG TABLET: 20 | 90 days supply | Qty: 90 | Fill #0

## 2016-04-24 ENCOUNTER — Encounter: Payer: Self-pay | Admitting: Primary Care

## 2016-05-27 DIAGNOSIS — E119 Type 2 diabetes mellitus without complications: Secondary | ICD-10-CM | POA: Diagnosis not present

## 2016-05-27 LAB — HM DIABETES EYE EXAM

## 2016-08-19 ENCOUNTER — Other Ambulatory Visit: Payer: Self-pay | Admitting: Primary Care

## 2016-08-19 DIAGNOSIS — E782 Mixed hyperlipidemia: Secondary | ICD-10-CM

## 2016-08-19 MED FILL — ATORVASTATIN 20 MG TABLET: 20 | 90 days supply | Qty: 90 | Fill #0

## 2016-08-19 MED FILL — CITALOPRAM HBR 20 MG TABLET: 20 | 90 days supply | Qty: 90 | Fill #1

## 2016-08-19 MED FILL — EZETIMIBE 10 MG TABLET: 10 | 90 days supply | Qty: 90 | Fill #1

## 2016-12-24 MED FILL — ATORVASTATIN 20 MG TABLET: 20 | 90 days supply | Qty: 90 | Fill #1

## 2016-12-24 MED FILL — CITALOPRAM HBR 20 MG TABLET: 20 | 90 days supply | Qty: 90 | Fill #2

## 2016-12-25 MED FILL — EZETIMIBE 10 MG TABLET: 10 | 90 days supply | Qty: 90 | Fill #2

## 2017-12-30 ENCOUNTER — Ambulatory Visit (INDEPENDENT_AMBULATORY_CARE_PROVIDER_SITE_OTHER): Payer: Medicare Other | Admitting: Nurse Practitioner

## 2017-12-30 ENCOUNTER — Encounter: Payer: Self-pay | Admitting: Nurse Practitioner

## 2017-12-30 ENCOUNTER — Other Ambulatory Visit: Payer: Self-pay

## 2017-12-30 VITALS — BP 123/69 | HR 62 | Temp 98.6°F | Resp 18 | Ht 66.0 in | Wt 182.8 lb

## 2017-12-30 DIAGNOSIS — M62830 Muscle spasm of back: Secondary | ICD-10-CM | POA: Diagnosis not present

## 2017-12-30 DIAGNOSIS — J019 Acute sinusitis, unspecified: Secondary | ICD-10-CM

## 2017-12-30 DIAGNOSIS — Z7689 Persons encountering health services in other specified circumstances: Secondary | ICD-10-CM

## 2017-12-30 MED ORDER — BACLOFEN 10 MG PO TABS: 10.0000 mg | ORAL_TABLET | Freq: Every evening | ORAL | 1 refills | Status: DC | PRN

## 2017-12-30 NOTE — Patient Instructions (Addendum)
Maria Sloan,   Thank you for coming in to clinic today.  1. START working toward volunteering by calling the zoo or [employer]/shelter by November 13th.  2. Pain: You have a Right low back muscle strain.  - Start taking Tylenol extra strength 1 to 2 tablets every 6-8 hours for aches or fever/chills for next few days as needed.  Do not take more than 3,000 mg in 24 hours from all medicines.  May take Ibuprofen as well if tolerated 200-400mg  every 8 hours as needed. May alternate tylenol and ibuprofen in same day. - Use heat and ice.  Apply this for 15 minutes at a time 6-8 times per day.   - Muscle rub with lidocaine, lidocaine patch, Biofreeze, or tiger balm for topical pain relief.  Avoid using this with heat and ice to avoid burns. - START muscle relaxer baclofen 10 mg one tablet at bedtime.  This can cause drowsiness, so use caution.  It may be best to only take this at night for helping you during sleep.  3. COLD: 1. It sounds like you have a Upper Respiratory Virus - this will most likely run it's course in 7 to 10 days. Recommend good hand washing. - Start anti-histamine loratadine or cetirizine 10mg  daily, also can use Flonase 2 sprays each nostril daily for up to 4-6 weeks - If congestion is worse, start OTC Mucinex (or may try Mucinex-DM for cough) up to 7-10 days then stop - Drink plenty of fluids to improve congestion - You may try over the counter Nasal Saline spray (Simply Saline, Ocean Spray) as needed to reduce congestion. - Drink warm herbal tea with honey for sore throat. - Start taking Tylenol extra strength 1 to 2 tablets every 6-8 hours for aches or fever/chills for next few days as needed.  Do not take more than 3,000 mg in 24 hours from all medicines.  May take Ibuprofen as well if tolerated 200-400mg  every 8 hours as needed.  If symptoms significantly worsening with persistent fevers/chills despite tylenol/ibpurofen, nausea, vomiting unable to tolerate food/fluids or  medicine, body aches, or shortness of breath, sinus pain pressure or worsening productive cough, then follow-up for re-evaluation, may seek more immediate care at Urgent Care or ED if more concerned for emergency.   4. You will be due for FASTING BLOOD WORK.  This means you should eat no food or drink after midnight.  Drink only water or coffee without cream/sugar on the morning of your lab visit. - Please go ahead and schedule a "Lab Only" visit in the morning at the clinic for lab draw on the day of your next appointment. - Your results will be available about 2-3 days after blood draw.  If you have set up a MyChart account, you can can log in to MyChart online to view your results and a brief explanation. Also, we can discuss your results together at your next office visit if you would like.     Please schedule a follow-up appointment with [name], AGNP. Return in about 6 weeks (around 02/10/2018) for diabetes, cholesterol.  Call clinic  If any new fever before your cold is completely better.  If you have any other questions or concerns, please feel free to call the clinic or send a message through [facility]. You may also schedule an earlier appointment if necessary.  You will receive a survey after today's visit either digitally by e-mail or paper by [employer]. Your experiences and feedback matter to [place].  Please respond so we know how we are doing as we provide care for you.   [name], DNP, AGNP-BC Adult Gerontology Nurse Practitioner [facility], [facility]   Low Back Pain Exercises See other page with pictures of each exercise.  Start with 1 or 2 of these exercises that you are most comfortable with. Do not do any exercises that cause you significant worsening pain. Some of these may cause some "stretching soreness" but it should go away after you stop the exercise, and get better over time. Gradually increase up to 3-4 exercises as tolerated.  Standing hamstring  stretch: Place the heel of your leg on a stool about 15 inches high. Keep your knee straight. Lean forward, bending at the hips until you feel a mild stretch in the back of your thigh. Make sure you do not roll your shoulders and bend at the waist when doing this or you will stretch your lower back instead. Hold the stretch for 15 to 30 seconds. Repeat 3 times. Repeat the same stretch on your other leg.  Cat and camel: Get down on your hands and knees. Let your stomach sag, allowing your back to curve downward. Hold this position for 5 seconds. Then arch your back and hold for 5 seconds. Do 3 sets of 10.  Quadriped Arm/Leg Raises: Get down on your hands and knees. Tighten your abdominal muscles to stiffen your spine. While keeping your abdominals tight, raise one arm and the opposite leg away from you. Hold this position for 5 seconds. Lower your arm and leg slowly and alternate sides. Do this 10 times on each side.  Pelvic tilt: Lie on your back with your knees bent and your feet flat on the floor. Tighten your abdominal muscles and push your lower back into the floor. Hold this position for 5 seconds, then relax. Do 3 sets of 10.  Partial curl: Lie on your back with your knees bent and your feet flat on the floor. Tighten your stomach muscles and flatten your back against the floor. Tuck your chin to your chest. With your hands stretched out in front of you, curl your upper body forward until your shoulders clear the floor. Hold this position for 3 seconds. Don't hold your breath. It helps to breathe out as you lift your shoulders up. Relax. Repeat 10 times. Build to 3 sets of 10. To challenge yourself, clasp your hands behind your head and keep your elbows out to the side.  Lower trunk rotation: Lie on your back with your knees bent and your feet flat on the floor. Tighten your abdominal muscles and push your lower back into the floor. Keeping your shoulders down flat, gently rotate your legs to one  side, then the other as far as you can. Repeat 10 to 20 times.  Single knee to chest stretch: Lie on your back with your legs straight out in front of you. Bring one knee up to your chest and grasp the back of your thigh. Pull your knee toward your chest, stretching your buttock muscle. Hold this position for 15 to 30 seconds and return to the starting position. Repeat 3 times on each side.  Double knee to chest: Lie on your back with your knees bent and your feet flat on the floor. Tighten your abdominal muscles and push your lower back into the floor. Pull both knees up to your chest. Hold for 5 seconds and repeat 10 to 20 times.

## 2017-12-30 NOTE — Progress Notes (Signed)
Subjective:    Patient ID: Maria Sloan, female    DOB: 1950/07/06, 67 y.o.   MRN: [id number]  Maria Sloan is a 67 y.o. female presenting on 12/30/2017 for Establish Care (sinus pressure, coughing, fever and sore throat  x 1 week. ) and Back Pain (Intermittent LRQ dull ache x 2 weeks, denies urinary issues, but reports certain movement make the pain appear)   HPI Establish Care New Provider Pt last seen by PCP Alma Friendly, NP at [facility] about 1.5 years ago.  Obtain records from [facility].    Back Pain Has had very remote back pain midline back pain.  Now has RIGHT lower intermittent ache, dull shooting pain in R lower back.  Then became constant, but bearable and sharp/shooting with specific movements.  Occasionally had difficulty getting off the couch.  Helped by muscle relaxant from a family member, possibly Flexeril. Resolved with new fever and feels some return of pain.  Sinus Symptoms started with coughing on Monday last week, about 10 days ago.  Fever for about 4 days, which is now resolved.  Is starting to feel better each day at this time.   - Sinus medicine helps for about 4 ho urs.  Mucinex sinus, and tylenol sinus severe. No problems with allergy and has had good relief. - Sleeping better with resolving cough.  Past Medical History:  Diagnosis Date  . Anxiety and depression   . Arthritis   . Cervical cancer ([place])   . Hyperlipidemia   . PVD (peripheral vascular disease) ([facility])    R leg venous insufficiency  . PVD (peripheral vascular disease) ([place])   . Shingles   . Type 2 diabetes mellitus ([place])    Past Surgical History:  Procedure Laterality Date  . PARTIAL HYSTERECTOMY     ovaries remain  . ROTATOR CUFF REPAIR Right    Social History   Socioeconomic History  . Marital status: Divorced    Spouse name: Not on file  . Number of children: Not on file  . Years of education: Not on file  . Highest education level: Bachelor's degree (e.g., BA, AB, BS)    Occupational History  . Not on file  Social Needs  . Financial resource strain: Not on file  . Food insecurity:    Worry: Not on file    Inability: Not on file  . Transportation needs:    Medical: Not on file    Non-medical: Not on file  Tobacco Use  . Smoking status: Former Smoker    Packs/day: 1.00    Years: 25.00    Sloan years: 25.00    Types: Cigarettes    Last attempt to quit: 11/29/1994    Years since quitting: 23.1  . Smokeless tobacco: Never Used  Substance and Sexual Activity  . Alcohol use: Never    Alcohol/week: 0.0 standard drinks    Frequency: Never  . Drug use: Never  . Sexual activity: Not Currently    Birth control/protection: None  Lifestyle  . Physical activity:    Days per week: Not on file    Minutes per session: Not on file  . Stress: Not on file  Relationships  . Social connections:    Talks on phone: Not on file    Gets together: Not on file    Attends religious service: Not on file    Active member of club or organization: Not on file    Attends meetings of clubs or organizations: Not on file  Relationship status: Not on file  . Intimate partner violence:    Fear of current or ex partner: Not on file    Emotionally abused: Not on file    Physically abused: Not on file    Forced sexual activity: Not on file  Other Topics Concern  . Not on file  Social History Narrative   Divorced.   1 son.    Moved from [place].   Enjoys making jewelry, spending time with family, crafts, reading.   Family History  Problem Relation Age of Onset  . Emphysema Father   . Lung cancer Father   . Hyperlipidemia Father   . Stroke Mother   . Hyperlipidemia Sister   . Healthy Son    Current Outpatient Medications on File Prior to Visit  Medication Sig  . CALCIUM CITRATE PO Take by mouth.  . Cholecalciferol (VITAMIN D PO) Take by mouth.  . Multiple Vitamin (MULTIVITAMIN) tablet Take 1 tablet by mouth daily.  . Omega-3 Fatty Acids (FISH OIL) 1000 MG CAPS  Take 1,000 mg by mouth 3 (three) times daily.  . Potassium 75 MG TABS Take 1 tablet by mouth daily.   No current facility-administered medications on file prior to visit.     Review of Systems  Constitutional: Positive for fever. Negative for chills.  HENT: Positive for sinus pressure, sinus pain and sore throat. Negative for congestion.   Eyes: Negative for pain.  Respiratory: Positive for cough. Negative for shortness of breath and wheezing.   Cardiovascular: Negative for chest pain, palpitations and leg swelling.  Gastrointestinal: Negative for abdominal pain, blood in stool, constipation, diarrhea, nausea and vomiting.  Endocrine: Negative for polydipsia.  Genitourinary: Negative for dysuria, frequency, hematuria and urgency.  Musculoskeletal: Positive for arthralgias, back pain and myalgias. Negative for neck pain.  Skin: Negative.  Negative for rash.  Allergic/Immunologic: Negative for environmental allergies.  Neurological: Negative for dizziness, weakness and headaches.  Hematological: Does not bruise/bleed easily.  Psychiatric/Behavioral: Negative for dysphoric mood and suicidal ideas. The patient is not nervous/anxious.    Per HPI unless specifically indicated above     Objective:    BP 123/69   Pulse 62   Temp 98.6 F (37 C) (Oral)   Resp 18   Ht 5\' 6"  (1.676 m)   Wt 182 lb 12.8 oz (82.9 kg)   SpO2 97%   BMI 29.50 kg/m   Wt Readings from Last 3 Encounters:  12/30/17 182 lb 12.8 oz (82.9 kg)  04/07/16 190 lb 12.8 oz (86.5 kg)  03/13/16 194 lb (88 kg)    Physical Exam  Constitutional: She appears well-developed and well-nourished.  HENT:  Head: Normocephalic and atraumatic.  Right Ear: Hearing, tympanic membrane, external ear and ear canal normal.  Left Ear: Hearing, tympanic membrane, external ear and ear canal normal.  Nose: Mucosal edema and rhinorrhea present. Right sinus exhibits maxillary sinus tenderness and frontal sinus tenderness. Left sinus exhibits  maxillary sinus tenderness and frontal sinus tenderness.  Mouth/Throat: Uvula is midline and mucous membranes are normal. Oropharyngeal exudate (clear secretions), posterior oropharyngeal edema (cobblestoning) and posterior oropharyngeal erythema (mildly injected) present. Tonsils are 0 on the right. Tonsils are 0 on the left.  Eyes: Pupils are equal, round, and reactive to light. Conjunctivae and EOM are normal. Right eye exhibits no discharge. Left eye exhibits no discharge.  Neck: Normal range of motion. Neck supple.  Cardiovascular: Normal rate, regular rhythm, S1 normal, S2 normal, normal heart sounds and intact distal pulses.  Pulmonary/Chest:  Effort normal and breath sounds normal. No respiratory distress.  Musculoskeletal:  Low Back Inspection: Normal appearance, no spinal deformity, symmetrical. Palpation: No tenderness over spinous processes. Bilateral lumbar paraspinal muscles mildly tender and with hypertonicity. ROM: Full active ROM forward flex / back extension, rotation L/R with mild discomfort on rotation to L Special Testing: Seated SLR negative for radicular pain bilaterally. Standing facet load test negative R/L back pain. Strength: Bilateral hip flex/ext 5/5, knee flex/ext 5/5, ankle dorsiflex/plantarflex 5/5 Neurovascular: intact distal sensation to light touch   Lymphadenopathy:    She has cervical adenopathy (mild superficial cervical bilaterally).  Neurological: She is alert.  Skin: Skin is warm and dry. Capillary refill takes less than 2 seconds.  Psychiatric: She has a normal mood and affect. Her behavior is normal. Judgment and thought content normal.  Vitals reviewed.    Results for orders placed or performed in visit on 06/03/16  HM DIABETES EYE EXAM  Result Value Ref Range   HM Diabetic Eye Exam  No Retinopathy      Assessment & Plan:   Problem List Items Addressed This Visit    None    Visit Diagnoses    Lumbar paraspinal muscle spasm    -   Primary Pain likely self-limited.  Muscle strain possible complicated by sedentary lifestyle.  Plan:  1. Treat with OTC pain meds (acetaminophen and ibuprofen).  Discussed alternate dosing and max dosing. 2. Apply heat and/or ice to affected area. 3. May also apply a muscle rub with lidocaine or lidocaine patch after heat or ice. 4. Take muscle relaxer baclofen 10 mg one tab at bedtime prn spasm.  Cautioned drowsiness. 5. Follow up 6 weeks    Relevant Medications   baclofen (LIORESAL) 10 MG tablet   Encounter to establish care     Previous PCP was at [facility].  Records are reviewed in [facility].  Past medical, family, and surgical history reviewed w/ pt.     Acute rhinosinusitis     Acute illness. Fever responsive to NSAIDs and tylenol.  Symptoms improving slowly. Consistent with viral illness x 8 days with no known sick contacts and no identifiable focal infections of ears, nose, throat.  Plan: 1. Reassurance, likely self-limited with cough lasting up to few weeks - Start anti-histamine Loratadine or cetirizine 10mg  daily,  - also can use Flonase 2 sprays each nostril daily for up to 4-6 weeks - Start Mucinex-DM OTC up to 7-10 days then stop 2. Supportive care with nasal saline, warm herbal tea with honey, 3. Improve hydration 4. Tylenol / Motrin PRN fevers 5. Return criteria given       Meds ordered this encounter  Medications  . baclofen (LIORESAL) 10 MG tablet    Sig: Take 1 tablet (10 mg total) by mouth at bedtime as needed for muscle spasms.    Dispense:  20 each    Refill:  1    Order Specific Question:   Supervising Provider    Answer:   Olin Hauser [[id number]]    Follow up plan: Return in about 6 weeks (around 02/10/2018) for diabetes, cholesterol.  [name], DNP, AGPCNP-BC Adult Gerontology Primary Care Nurse Practitioner [facility] 12/30/2017, 11:05 AM

## 2017-12-31 ENCOUNTER — Encounter: Payer: Self-pay | Admitting: Nurse Practitioner

## 2017-12-31 DIAGNOSIS — F329 Major depressive disorder, single episode, unspecified: Secondary | ICD-10-CM

## 2017-12-31 DIAGNOSIS — E781 Pure hyperglyceridemia: Secondary | ICD-10-CM

## 2017-12-31 DIAGNOSIS — F419 Anxiety disorder, unspecified: Secondary | ICD-10-CM

## 2017-12-31 DIAGNOSIS — E782 Mixed hyperlipidemia: Secondary | ICD-10-CM

## 2017-12-31 DIAGNOSIS — F32A Depression, unspecified: Secondary | ICD-10-CM

## 2017-12-31 MED ORDER — ATORVASTATIN CALCIUM 20 MG PO TABS: 20.0000 mg | ORAL_TABLET | Freq: Every day | ORAL | 0 refills | Status: DC

## 2017-12-31 MED ORDER — CITALOPRAM HYDROBROMIDE 20 MG PO TABS: 20.0000 mg | ORAL_TABLET | Freq: Every day | ORAL | 0 refills | Status: DC

## 2017-12-31 MED ORDER — EZETIMIBE 10 MG PO TABS: 10.0000 mg | ORAL_TABLET | Freq: Every day | ORAL | 0 refills | Status: DC

## 2018-01-04 ENCOUNTER — Encounter: Payer: Self-pay | Admitting: Nurse Practitioner

## 2018-01-07 DIAGNOSIS — Z23 Encounter for immunization: Secondary | ICD-10-CM | POA: Diagnosis not present

## 2018-01-13 ENCOUNTER — Ambulatory Visit: Payer: Self-pay | Admitting: Nurse Practitioner

## 2018-02-11 ENCOUNTER — Encounter: Payer: Self-pay | Admitting: Nurse Practitioner

## 2018-02-11 ENCOUNTER — Ambulatory Visit (INDEPENDENT_AMBULATORY_CARE_PROVIDER_SITE_OTHER): Payer: Medicare Other | Admitting: Nurse Practitioner

## 2018-02-11 ENCOUNTER — Other Ambulatory Visit: Payer: Self-pay

## 2018-02-11 VITALS — BP 125/63 | HR 60 | Temp 98.0°F | Ht 66.0 in | Wt 182.0 lb

## 2018-02-11 DIAGNOSIS — E781 Pure hyperglyceridemia: Secondary | ICD-10-CM | POA: Diagnosis not present

## 2018-02-11 DIAGNOSIS — R7303 Prediabetes: Secondary | ICD-10-CM | POA: Diagnosis not present

## 2018-02-11 DIAGNOSIS — E1165 Type 2 diabetes mellitus with hyperglycemia: Secondary | ICD-10-CM | POA: Diagnosis not present

## 2018-02-11 DIAGNOSIS — E782 Mixed hyperlipidemia: Secondary | ICD-10-CM

## 2018-02-11 LAB — COMPLETE METABOLIC PANEL WITH GFR
AG Ratio: 1.4 (calc) (ref 1.0–2.5)
ALT: 34 U/L — ABNORMAL HIGH (ref 6–29)
AST: 31 U/L (ref 10–35)
Albumin: 4.4 g/dL (ref 3.6–5.1)
Alkaline phosphatase (APISO): 73 U/L (ref 33–130)
BUN: 14 mg/dL (ref 7–25)
CO2: 29 mmol/L (ref 20–32)
Calcium: 9.6 mg/dL (ref 8.6–10.4)
Chloride: 99 mmol/L (ref 98–110)
Creat: 0.61 mg/dL (ref 0.50–0.99)
GFR, Est African American: 109 mL/min/{1.73_m2} (ref >=60)
GFR, Est Non African American: 94 mL/min/{1.73_m2} (ref >=60)
Globulin: 3.1 g/dL (calc) (ref 1.9–3.7)
Glucose, Bld: 281 mg/dL — ABNORMAL HIGH (ref 65–99)
Potassium: 4.5 mmol/L (ref 3.5–5.3)
Sodium: 136 mmol/L (ref 135–146)
Total Bilirubin: 0.7 mg/dL (ref 0.2–1.2)
Total Protein: 7.5 g/dL (ref 6.1–8.1)

## 2018-02-11 LAB — LIPID PANEL
Cholesterol: 146 mg/dL (ref <200)
HDL: 37 mg/dL — ABNORMAL LOW (ref >50)
LDL Cholesterol (Calc): 72 mg/dL (calc)
Non-HDL Cholesterol (Calc): 109 mg/dL (calc) (ref <130)
Total CHOL/HDL Ratio: 3.9 (calc) (ref <5.0)
Triglycerides: 279 mg/dL — ABNORMAL HIGH (ref <150)

## 2018-02-11 LAB — POCT GLYCOSYLATED HEMOGLOBIN (HGB A1C): Hemoglobin A1C: 11.8 % — AB (ref 4.0–5.6)

## 2018-02-11 LAB — TSH: TSH: 0.8 mIU/L (ref 0.40–4.50)

## 2018-02-11 MED ORDER — METFORMIN HCL 500 MG PO TABS: 500.0000 mg | ORAL_TABLET | Freq: Two times a day (BID) | ORAL | 3 refills | Status: DC

## 2018-02-11 MED ORDER — BLOOD GLUCOSE METER KIT: PACK | 0 refills | Status: DC

## 2018-02-11 MED ORDER — DAPAGLIFLOZIN PROPANEDIOL 10 MG PO TABS: 10.0000 mg | ORAL_TABLET | Freq: Every day | ORAL | 5 refills | Status: DC

## 2018-02-11 NOTE — Progress Notes (Signed)
Subjective:    Patient ID: Maria Sloan, female    DOB: [birth year], 68 y.o.   MRN: [id number]  Maria Sloan is a 67 y.o. female presenting on 02/11/2018 for Diabetes   HPI Diabetes Pt presents today for follow up of prediabetes.  On check today, she now has a diagnosis of uncontrolled Type 2 diabetes mellitus. She is not checking CBG at home and is not currently on any medications. - She is not currently symptomatic.  - She denies polydipsia, polyphagia, polyuria, headaches, diaphoresis, shakiness, chills, pain, numbness or tingling in extremities and changes in vision.   - Clinical course has been worsening over the last 2 years. - She  reports no regular exercise routine. - Her diet is moderate in salt, moderate in fat, and high in carbohydrates. - Weight trend: stable  PREVENTION: Eye exam current (within one year): no Foot exam current (within one year): no  Lipid/ASCVD risk reduction - on statin: no - does have known hypertriglyceridemia Kidney protection - on ace or arb: no  Recent Labs    02/11/18 0838  HGBA1C 11.8*   Mixed Dyslipidemia Patient is taking zetia 10 mg once daily and atorvastatin 20 mg once daily.  She continues on these as well as 3,000 mg fish oil daily for management.  - Pt denies changes in vision, chest tightness/pressure, palpitations, shortness of breath, leg pain while walking, leg or arm weakness, and sudden loss of speech or loss of consciousness.   Social History   Tobacco Use  . Smoking status: Former Smoker    Packs/day: 1.00    Years: 25.00    Pack years: 25.00    Types: Cigarettes    Last attempt to quit: 11/29/1994    Years since quitting: 23.2  . Smokeless tobacco: Never Used  Substance Use Topics  . Alcohol use: Never    Alcohol/week: 0.0 standard drinks    Frequency: Never  . Drug use: Never    Review of Systems Per HPI unless specifically indicated above     Objective:    BP 125/63 (BP Location: Left Arm, Patient  Position: Sitting, Cuff Size: Normal)   Pulse 60   Temp 98 F (36.7 C)   Ht _0  (1.676 m)   Wt 182 lb (82.6 kg)   BMI 29.38 kg/m   Wt Readings from Last 3 Encounters:  02/11/18 182 lb (82.6 kg)  12/30/17 182 lb 12.8 oz (82.9 kg)  04/07/16 190 lb 12.8 oz (86.5 kg)    Physical Exam Vitals signs reviewed.  Constitutional:      General: She is not in acute distress.    Appearance: She is well-developed. She is obese.  HENT:     Head: Normocephalic and atraumatic.     Mouth/Throat:     Mouth: Mucous membranes are moist.     Pharynx: Oropharynx is clear.  Neck:     Musculoskeletal: Normal range of motion and neck supple.     Vascular: No carotid bruit.  Cardiovascular:     Rate and Rhythm: Normal rate and regular rhythm.     Pulses: Normal pulses.     Heart sounds: Normal heart sounds, S1 normal and S2 normal.  Pulmonary:     Effort: Pulmonary effort is normal. No respiratory distress.     Breath sounds: Normal breath sounds.  Musculoskeletal:     Right lower leg: No edema.     Left lower leg: No edema.  Skin:    General: Skin is warm  and dry.     Capillary Refill: Capillary refill takes less than 2 seconds.  Neurological:     General: No focal deficit present.     Mental Status: She is alert and oriented to person, place, and time. Mental status is at baseline.     Motor: No weakness.     Coordination: Coordination normal.     Gait: Gait normal.  Psychiatric:        Mood and Affect: Mood normal.        Behavior: Behavior normal.        Thought Content: Thought content normal.        Judgment: Judgment normal.      Results for orders placed or performed in visit on 06/03/16  HM DIABETES EYE EXAM  Result Value Ref Range   HM Diabetic Eye Exam  No Retinopathy      Assessment & Plan:   Problem List Items Addressed This Visit      Endocrine   Type 2 diabetes mellitus with hyperglycemia ([place] [address]) - Primary Worsening to Uncontrolled T2DM with hyperglycemia.  Patient  is currently without any other identified complications. A1c = 11.8% worsening from 6.8% in 12/2015 - Not currently on medications - Knowledge about diabetes currently very limited with new diagnosis.  Plan:  1. START metformin 500 mg one tab by mouth at breakfast and taper up by 1 tab every 2 weeks - see AVS  - START Farxiga 10 mg once daily reviewed side effects of yeast infection, UTI, dehydration, metabolic acidosis, gangrene (Rare) 2. Encourage improved lifestyle: - low carb/low glycemic diet handout provided - Increase physical activity to 30 minutes most days of the week.  Explained that increased physical activity increases body's use of sugar for energy. 3. Check fasting am CBG and bring log to next visit for review.  Also requested patient check additional blood sugar daily for 2 total readings over next 6 weeks to assist in identifying problematic areas for patient's glucose control - Patient was instructed on technique by Donnie Mesa, CMA 4. Discuss ASA, ACEi and Statin at next visit.  Readjust statin after labs prn.  Will need high intensity if tolerated. 5. Advised to schedule DM ophtho exam, send record.  6. Follow-up 6 weeks.    Relevant Medications   blood glucose meter kit and supplies   metFORMIN (GLUCOPHAGE) 500 MG tablet   dapagliflozin propanediol (FARXIGA) 10 MG TABS tablet   Other Relevant Orders   COMPLETE METABOLIC PANEL WITH GFR   Lipid panel   TSH     Other   Mixed Dyslipidemia Status unknown.  Recheck labs.  Continue meds without changes today.  Refills provided. Followup in 6 weeks and after labs.    Relevant Orders   Lipid panel   TSH      Meds ordered this encounter  Medications  . blood glucose meter kit and supplies    Sig: Dispense based on patient and insurance preference. Use up to four times daily as directed. (FOR ICD-10 E10.9, E11.9).    Dispense:  1 each    Refill:  0    Order Specific Question:   Supervising Provider    Answer:    Olin Hauser [[id number]]    Order Specific Question:   Number of strips    Answer:   100    Order Specific Question:   Number of lancets    Answer:   100  . metFORMIN (GLUCOPHAGE) 500 MG tablet  Sig: Take 1 tablet (500 mg total) by mouth 2 (two) times daily with a meal.    Dispense:  180 tablet    Refill:  3    Order Specific Question:   Supervising Provider    Answer:   Olin Hauser [[id number]]  . dapagliflozin propanediol (FARXIGA) 10 MG TABS tablet    Sig: Take 10 mg by mouth daily.    Dispense:  30 tablet    Refill:  5    Order Specific Question:   Supervising Provider    Answer:   Olin Hauser [[id number]]   A total of 43 minutes was spent face-to-face with this patient. Greater than 50% of this time was spent in counseling and coordination of care with the patient. Specifically, information about DM disease process, self management, lifestyle, exercise/diet, and medications was shared in detail.  Patient had no questions at end of visit and has established goals for work toward improving control.    Follow up plan: Return in about 6 weeks (around 03/25/2018) for diabetes.  [name], DNP, AGPCNP-BC Adult Gerontology Primary Care Nurse Practitioner [facility] 02/11/2018, 8:31 AM

## 2018-02-11 NOTE — Patient Instructions (Addendum)
Maria Sloan,   Thank you for coming in to clinic today.  1. Metformin - Week 1-2 Take 500 mg once daily in mornings with breakfast. Week 3-4 Take 500 mg in morning with breakfast and in evening with supper. Week 5-6 Take 1000 mg (2 tablets) in morning and 500 mg (1 tablet) in evening _ If you have diarrhea or abdominal cramping with this medication, stay at that dose until it goes away.  If it is still severe at the third week, call clinic.  2. Continue regular physical activity. - You also have benefit of about 5-10 minutes of activity of any kind after meals.  3. Work toward having about 1-3 carb servings per meal and choosing more low glycemic carbohydrates.  4. Your provider would like to you have your annual eye exam. Please contact your current eye doctor or here are some good options for you to contact.   [facility]   Address: [address] [place], [place] [address] Phone: [phone]  Website: [email or web address] [address] [place], [place] [address] Phone: [phone] [email or web address]  [facility]  Address: [place], [place], [place] [address] Phone: [phone]   [facility] [address] [place], [place] [address] Phone: [phone]  [facility] Address: [place], [place], [place] [address]  Phone: [phone]   Please schedule a follow-up appointment with [name], AGNP. Return in about 6 weeks (around 03/25/2018) for diabetes.  If you have any other questions or concerns, please feel free to call the clinic or send a message through [place]. You may also schedule an earlier appointment if necessary.  You will receive a survey after today's visit either digitally by e-mail or paper by [employer]. Your experiences and feedback matter to [place].  Please respond so we know how we are doing as we provide care for you.   [name], DNP, AGNP-BC Adult Gerontology Nurse  Practitioner [facility]   Diabetes Mellitus and Standards of Medical Care Managing diabetes (diabetes mellitus) can be complicated. Your diabetes treatment may be managed by a team of health care providers, including:  A diet and nutrition specialist (registered dietitian).  A nurse.  A certified diabetes educator (CDE).  A diabetes specialist (endocrinologist).  An eye doctor.  A primary care provider.  A dentist.  Your health care providers follow a schedule in order to help you get the best quality of care. The following schedule is a general guideline for your diabetes management plan. Your health care providers may also give you more specific instructions. HbA1c ( hemoglobin A1c) test This test provides information about blood sugar (glucose) control over the previous 2-3 months. It is used to check whether your diabetes management plan needs to be adjusted.  If you are meeting your treatment goals, this test is done at least 2 times a year.  If you are not meeting treatment goals or if your treatment goals have changed, this test is done 4 times a year.  Blood pressure test  This test is done at every routine medical visit. For most people, the goal is less than 130/80. Ask your health care provider what your goal blood pressure should be. Dental and eye exams  Visit your dentist two times a year.  If you have type 1 diabetes, get an eye exam 3-5 years after you are diagnosed, and then once a year after your first  exam. ? If you were diagnosed with type 1 diabetes as a child, get an eye exam when you are age 19 or older and have had diabetes for 3-5 years. After the first exam, you should get an eye exam once a year.  If you have type 2 diabetes, have an eye exam as soon as you are diagnosed, and then once a year after your first exam. Foot care exam  Visual foot exams are done at every routine medical visit. The exams check for cuts, bruises,  redness, blisters, sores, or other problems with the feet.  A complete foot exam is done by your health care provider once a year. This exam includes an inspection of the structure and skin of your feet, and a check of the pulses and sensation in your feet. ? Type 1 diabetes: Get your first exam 3-5 years after diagnosis. ? Type 2 diabetes: Get your first exam as soon as you are diagnosed.  Check your feet every day for cuts, bruises, redness, blisters, or sores. If you have any of these or other problems that are not healing, contact your health care provider. Kidney function test ( urine microalbumin)  This test is done once a year. ? Type 1 diabetes: Get your first test 5 years after diagnosis. ? Type 2 diabetes: Get your first test as soon as you are diagnosed.  If you have chronic kidney disease (CKD), get a serum creatinine and estimated glomerular filtration rate (eGFR) test once a year. Lipid profile (cholesterol, HDL, LDL, triglycerides)  This test should be done when you are diagnosed with diabetes, and every 5 years after the first test. If you are on medicines to lower your cholesterol, you may need to get this test done every year. ? The goal for LDL is less than 100 mg/dL (5.5 mmol/L). If you are at high risk, the goal is less than 70 mg/dL (3.9 mmol/L). ? The goal for HDL is 40 mg/dL (2.2 mmol/L) for men and 50 mg/dL(2.8 mmol/L) for women. An HDL cholesterol of 60 mg/dL (3.3 mmol/L) or higher gives some protection against heart disease. ? The goal for triglycerides is less than 150 mg/dL (8.3 mmol/L). Immunizations  The yearly flu (influenza) vaccine is recommended for everyone 6 months or older who has diabetes.  The pneumonia (pneumococcal) vaccine is recommended for everyone 2 years or older who has diabetes. If you are 53 or older, you may get the pneumonia vaccine as a series of two separate shots.  The hepatitis B vaccine is recommended for adults shortly after they  have been diagnosed with diabetes.  The Tdap (tetanus, diphtheria, and pertussis) vaccine should be given: ? According to normal childhood vaccination schedules, for children. ? Every 10 years, for adults who have diabetes.  The shingles vaccine is recommended for people who have had chicken pox and are 50 years or older. Mental and emotional health  Screening for symptoms of eating disorders, anxiety, and depression is recommended at the time of diagnosis and afterward as needed. If your screening shows that you have symptoms (you have a positive screening result), you may need further evaluation and be referred to a mental health care provider. Diabetes self-management education  Education about how to manage your diabetes is recommended at diagnosis and ongoing as needed. Treatment plan  Your treatment plan will be reviewed at every medical visit. Summary  Managing diabetes (diabetes mellitus) can be complicated. Your diabetes treatment may be managed by a team of health  care providers.  Your health care providers follow a schedule in order to help you get the best quality of care.  Standards of care including having regular physical exams, blood tests, blood pressure monitoring, immunizations, screening tests, and education about how to manage your diabetes.  Your health care providers may also give you more specific instructions based on your individual health. This information is not intended to replace advice given to you by your health care provider. Make sure you discuss any questions you have with your health care provider. Document Released: 12/14/2008 Document Revised: 11/15/2015 Document Reviewed: 11/15/2015 Elsevier Interactive Patient Education  [employer].

## 2018-02-12 ENCOUNTER — Encounter: Payer: Self-pay | Admitting: Nurse Practitioner

## 2018-02-14 ENCOUNTER — Other Ambulatory Visit: Payer: Self-pay

## 2018-02-14 DIAGNOSIS — E1165 Type 2 diabetes mellitus with hyperglycemia: Secondary | ICD-10-CM

## 2018-02-14 MED ORDER — GLUCOSE BLOOD VI STRP
ORAL_STRIP | 12 refills | Status: DC
Start: 2018-02-14 — End: 2018-02-15

## 2018-02-14 MED ORDER — ACCU-CHEK SOFT TOUCH LANCETS MISC: 12 refills | Status: DC

## 2018-02-14 MED ORDER — ACCU-CHEK AVIVA PLUS W/DEVICE KIT: 1.0000 | PACK | 0 refills | Status: DC

## 2018-02-15 ENCOUNTER — Other Ambulatory Visit: Payer: Self-pay

## 2018-02-15 DIAGNOSIS — E1165 Type 2 diabetes mellitus with hyperglycemia: Secondary | ICD-10-CM

## 2018-02-15 MED ORDER — ACCU-CHEK SOFT TOUCH LANCETS MISC: 12 refills | Status: DC

## 2018-02-15 MED ORDER — ACCU-CHEK AVIVA PLUS W/DEVICE KIT: 1.0000 | PACK | 0 refills | Status: AC

## 2018-02-15 MED ORDER — GLUCOSE BLOOD VI STRP: ORAL_STRIP | 12 refills | Status: DC

## 2018-03-25 ENCOUNTER — Other Ambulatory Visit: Payer: Self-pay | Admitting: Nurse Practitioner

## 2018-03-25 DIAGNOSIS — F329 Major depressive disorder, single episode, unspecified: Secondary | ICD-10-CM

## 2018-03-25 DIAGNOSIS — F419 Anxiety disorder, unspecified: Secondary | ICD-10-CM

## 2018-03-25 DIAGNOSIS — F32A Depression, unspecified: Secondary | ICD-10-CM

## 2018-03-25 DIAGNOSIS — E782 Mixed hyperlipidemia: Secondary | ICD-10-CM

## 2018-03-30 ENCOUNTER — Ambulatory Visit (INDEPENDENT_AMBULATORY_CARE_PROVIDER_SITE_OTHER): Payer: Medicare Other | Admitting: Nurse Practitioner

## 2018-03-30 VITALS — BP 110/60 | HR 61 | Resp 15 | Ht 66.0 in | Wt 178.0 lb

## 2018-03-30 DIAGNOSIS — E1165 Type 2 diabetes mellitus with hyperglycemia: Secondary | ICD-10-CM | POA: Diagnosis not present

## 2018-03-30 MED ORDER — METFORMIN HCL 1000 MG PO TABS: 1000.0000 mg | ORAL_TABLET | Freq: Two times a day (BID) | ORAL | 1 refills | Status: DC

## 2018-03-30 MED ORDER — GLIMEPIRIDE 2 MG PO TABS: 2.0000 mg | ORAL_TABLET | Freq: Every day | ORAL | 5 refills | Status: DC

## 2018-03-30 NOTE — Progress Notes (Signed)
Subjective:    Patient ID: Maria Sloan, female    DOB: 07-15-1950, 68 y.o.   MRN: [id number]  Maria Sloan is a 68 y.o. female presenting on 03/30/2018 for Diabetes   HPI Diabetes Pt presents today for follow up of Type 2 diabetes mellitus. She is checking fasting am CBG at home with a range of 282-153, improved over last 3 weeks to 146-180s mostly - Current diabetic medications include: metformin 1,000 mg at breakfast/500 mg supper; Farxiga10 mg once daily - She is not currently symptomatic.  - She denies polydipsia, polyphagia, polyuria, headaches, diaphoresis, shakiness, chills, pain, numbness or tingling in extremities and changes in vision.   - Clinical course has been improving. - She  reports an exercise routine that includes walking, 3 days per week. - Her diet is moderate in salt, moderate in fat, and moderate in carbohydrates. - Weight trend: stable    Recent Labs    02/11/18 0838  HGBA1C 11.8*     Diet recall: Bedtime snack is usually almonds or sugar free pudding/jello  Lunch whole wheat tortilla with Kidney beans or chicken and lettuce with cheese Supper last night - beans and brown rice with low carb veggie, Mashed potatoes salisbury steak and corn Eats meat 1-2 meals per day and always at dinner  Social History   Tobacco Use  . Smoking status: Former Smoker    Packs/day: 1.00    Years: 25.00    Pack years: 25.00    Types: Cigarettes    Last attempt to quit: 11/29/1994    Years since quitting: 23.3  . Smokeless tobacco: Never Used  Substance Use Topics  . Alcohol use: Never    Alcohol/week: 0.0 standard drinks    Frequency: Never  . Drug use: Never    Review of Systems Per HPI unless specifically indicated above     Objective:    BP 110/60 (BP Location: Left Arm, Patient Position: Sitting, Cuff Size: Normal)   Pulse 61   Resp 15   Ht 5\' 6"  (1.676 m)   Wt 178 lb (80.7 kg)   SpO2 96%   BMI 28.73 kg/m   Wt Readings from Last 3 Encounters:    03/30/18 178 lb (80.7 kg)  02/11/18 182 lb (82.6 kg)  12/30/17 182 lb 12.8 oz (82.9 kg)    Physical Exam Vitals signs reviewed.  Constitutional:      General: She is not in acute distress.    Appearance: She is well-developed.  HENT:     Head: Normocephalic and atraumatic.  Cardiovascular:     Rate and Rhythm: Normal rate and regular rhythm.     Pulses:          Radial pulses are 2+ on the right side and 2+ on the left side.       Posterior tibial pulses are 1+ on the right side and 1+ on the left side.     Heart sounds: Normal heart sounds, S1 normal and S2 normal.  Pulmonary:     Effort: Pulmonary effort is normal. No respiratory distress.     Breath sounds: Normal breath sounds and air entry.  Musculoskeletal:     Right lower leg: No edema.     Left lower leg: No edema.  Skin:    General: Skin is warm and dry.     Capillary Refill: Capillary refill takes less than 2 seconds.  Neurological:     Mental Status: She is alert and oriented to person, place, and  time.  Psychiatric:        Attention and Perception: Attention normal.        Mood and Affect: Mood and affect normal.        Behavior: Behavior normal. Behavior is cooperative.    Results for orders placed or performed in visit on 02/11/18  COMPLETE METABOLIC PANEL WITH GFR  Result Value Ref Range   Glucose, Bld 281 (H) 65 - 99 mg/dL   BUN 14 7 - 25 mg/dL   Creat 0.61 0.50 - 0.99 mg/dL   GFR, Est Non African American 94 > OR = 60 mL/min/1.67m2   GFR, Est African American 109 > OR = 60 mL/min/1.50m2   BUN/Creatinine Ratio NOT APPLICABLE 6 - 22 (calc)   Sodium 136 135 - 146 mmol/L   Potassium 4.5 3.5 - 5.3 mmol/L   Chloride 99 98 - 110 mmol/L   CO2 29 20 - 32 mmol/L   Calcium 9.6 8.6 - 10.4 mg/dL   Total Protein 7.5 6.1 - 8.1 g/dL   Albumin 4.4 3.6 - 5.1 g/dL   Globulin 3.1 1.9 - 3.7 g/dL (calc)   AG Ratio 1.4 1.0 - 2.5 (calc)   Total Bilirubin 0.7 0.2 - 1.2 mg/dL   Alkaline phosphatase (APISO) 73 33 - 130  U/L   AST 31 10 - 35 U/L   ALT 34 (H) 6 - 29 U/L  Lipid panel  Result Value Ref Range   Cholesterol 146 <200 mg/dL   HDL 37 (L) >50 mg/dL   Triglycerides 279 (H) <150 mg/dL   LDL Cholesterol (Calc) 72 mg/dL (calc)   Total CHOL/HDL Ratio 3.9 <5.0 (calc)   Non-HDL Cholesterol (Calc) 109 <130 mg/dL (calc)  TSH  Result Value Ref Range   TSH 0.80 0.40 - 4.50 mIU/L  POCT glycosylated hemoglobin (Hb A1C)  Result Value Ref Range   Hemoglobin A1C 11.8 (A) 4.0 - 5.6 %   HbA1c POC (<> result, manual entry)     HbA1c, POC (prediabetic range)     HbA1c, POC (controlled diabetic range)        Assessment & Plan:   Problem List Items Addressed This Visit      Endocrine   Type 2 diabetes mellitus with hyperglycemia (HCC) - Primary   Relevant Medications   metFORMIN (GLUCOPHAGE) 1000 MG tablet   glimepiride (AMARYL) 2 MG tablet   Other Relevant Orders   Ambulatory referral to diabetic education    Improving, but remains uncontrolledDM with home morning fasting and post prandial CBG remaining above goals.  Goal A1c < 7.0%. - Complications - hyperglycemia.  Plan:  1. Change therapy:  - INCREASE metformin to 1,000 mg bid - Continue Farxiga 10 mg once daily - START glimepiride 2 mg once daily with breakfast - cautioned patient about hypoglycemia.  Cost is barrier to second branded med. 2. Encourage improved lifestyle: - low carb/low glycemic diet reinforced prior education.  Reviewed last 2 days of diet and provided options for better choices. - Increase physical activity to 30 minutes most days of the week.  Explained that increased physical activity increases body's use of sugar for energy.  Add 10 min walks after meals to help glycemic control. 3. Check fasting am CBG and bring log to next visit for review.  Reviewed utility of pre and post-prandial checks for response to food.  Goal post prandial is < 180 4. Follow-up 6 weeks with repeat A1c.    A total of 25 minutes was spent  face-to-face  with this patient. Greater than 50% of this time was spent in counseling and coordination of care with the patient as above.   Meds ordered this encounter  Medications  . metFORMIN (GLUCOPHAGE) 1000 MG tablet    Sig: Take 1 tablet (1,000 mg total) by mouth 2 (two) times daily with a meal.    Dispense:  180 tablet    Refill:  1    Order Specific Question:   Supervising Provider    Answer:   Olin Hauser [[id number]]  . glimepiride (AMARYL) 2 MG tablet    Sig: Take 1 tablet (2 mg total) by mouth daily with breakfast.    Dispense:  30 tablet    Refill:  5    Order Specific Question:   Supervising Provider    Answer:   Olin Hauser [[id number]]    Follow up plan: Return in about 6 weeks (around 05/11/2018) for diabetes.  [name], DNP, AGPCNP-BC Adult Gerontology Primary Care Nurse Practitioner [facility] 03/30/2018, 11:14 AM

## 2018-03-30 NOTE — Patient Instructions (Addendum)
Maria Sloan,   Thank you for coming in to clinic today.  1. Continue Farxiga once daily 2. INCREASE metformin to 1,000 mg twice daily 3. START glimepiride 2 mg once daily with breakfast. - Watch for symptoms of low blood sugar (cold, sweaty/clammy, shaky, not feeling good).  Check blood sugar and eat a carb snack if less than 70.  4. Carbs - eat 1-3 servings per meal and limit to 7-8 per day.   Please schedule a follow-up appointment with [name], AGNP. Return in about 6 weeks (around 05/11/2018) for diabetes.  If you have any other questions or concerns, please feel free to call the clinic or send a message through [place]. You may also schedule an earlier appointment if necessary.  You will receive a survey after today's visit either digitally by e-mail or paper by [employer]. Your experiences and feedback matter to [place].  Please respond so we know how we are doing as we provide care for you.   [name], DNP, AGNP-BC Adult Gerontology Nurse Practitioner [facility], [facility]   Carbohydrate Counting for Diabetes Mellitus, Adult  Carbohydrate counting is a method of keeping track of how many carbohydrates you eat. Eating carbohydrates naturally increases the amount of sugar (glucose) in the blood. Counting how many carbohydrates you eat helps keep your blood glucose within normal limits, which helps you manage your diabetes (diabetes mellitus). It is important to know how many carbohydrates you can safely have in each meal. This is different for every person. A diet and nutrition specialist (registered dietitian) can help you make a meal plan and calculate how many carbohydrates you should have at each meal and snack. Carbohydrates are found in the following foods:  Grains, such as breads and cereals.  Dried beans and soy products.  Starchy vegetables, such as potatoes, peas, and corn.  Fruit and fruit juices.  Milk and yogurt.  Sweets and snack foods,  such as cake, cookies, candy, chips, and soft drinks. How do I count carbohydrates? There are two ways to count carbohydrates in food. You can use either of the methods or a combination of both. Reading "Nutrition Facts" on packaged food The "Nutrition Facts" list is included on the labels of almost all packaged foods and beverages in the [place] It includes:  The serving size.  Information about nutrients in each serving, including the grams (g) of carbohydrate per serving. To use the "Nutrition Facts":  Decide how many servings you will have.  Multiply the number of servings by the number of carbohydrates per serving.  The resulting number is the total amount of carbohydrates that you will be having. Learning standard serving sizes of other foods When you eat carbohydrate foods that are not packaged or do not include "Nutrition Facts" on the label, you need to measure the servings in order to count the amount of carbohydrates:  Measure the foods that you will eat with a food scale or measuring cup, if needed.  Decide how many standard-size servings you will eat.  Multiply the number of servings by 15. Most carbohydrate-rich foods have about 15 g of carbohydrates per serving. ? For example, if you eat 8 oz (170 g) of strawberries, you will have eaten 2 servings and 30 g of carbohydrates (2 servings x 15 g = 30 g).  For foods that have more than one food mixed, such as soups and casseroles, you must count the carbohydrates in each food that is included. The following list contains  standard serving sizes of common carbohydrate-rich foods. Each of these servings has about 15 g of carbohydrates:   hamburger bun or  English muffin.   oz (15 mL) syrup.   oz (14 g) jelly.  1 slice of bread.  1 six-inch tortilla.  3 oz (85 g) cooked rice or pasta.  4 oz (113 g) cooked dried beans.  4 oz (113 g) starchy vegetable, such as peas, corn, or potatoes.  4 oz (113 g) hot cereal.  4  oz (113 g) mashed potatoes or  of a large baked potato.  4 oz (113 g) canned or frozen fruit.  4 oz (120 mL) fruit juice.  4-6 crackers.  6 chicken nuggets.  6 oz (170 g) unsweetened dry cereal.  6 oz (170 g) plain fat-free yogurt or yogurt sweetened with artificial sweeteners.  8 oz (240 mL) milk.  8 oz (170 g) fresh fruit or one small piece of fruit.  24 oz (680 g) popped popcorn. Example of carbohydrate counting Sample meal  3 oz (85 g) chicken breast.  6 oz (170 g) brown rice.  4 oz (113 g) corn.  8 oz (240 mL) milk.  8 oz (170 g) strawberries with sugar-free whipped topping. Carbohydrate calculation 1. Identify the foods that contain carbohydrates: ? Rice. ? Corn. ? Milk. ? Strawberries. 2. Calculate how many servings you have of each food: ? 2 servings rice. ? 1 serving corn. ? 1 serving milk. ? 1 serving strawberries. 3. Multiply each number of servings by 15 g: ? 2 servings rice x 15 g = 30 g. ? 1 serving corn x 15 g = 15 g. ? 1 serving milk x 15 g = 15 g. ? 1 serving strawberries x 15 g = 15 g. 4. Add together all of the amounts to find the total grams of carbohydrates eaten: ? 30 g + 15 g + 15 g + 15 g = 75 g of carbohydrates total. Summary  Carbohydrate counting is a method of keeping track of how many carbohydrates you eat.  Eating carbohydrates naturally increases the amount of sugar (glucose) in the blood.  Counting how many carbohydrates you eat helps keep your blood glucose within normal limits, which helps you manage your diabetes.  A diet and nutrition specialist (registered dietitian) can help you make a meal plan and calculate how many carbohydrates you should have at each meal and snack. This information is not intended to replace advice given to you by your health care provider. Make sure you discuss any questions you have with your health care provider. Document Released: 02/16/2005 Document Revised: 08/26/2016 Document Reviewed:  07/31/2015 Elsevier Interactive Patient Education  2019 [employer].

## 2018-03-31 ENCOUNTER — Other Ambulatory Visit: Payer: Self-pay | Admitting: Nurse Practitioner

## 2018-03-31 DIAGNOSIS — E781 Pure hyperglyceridemia: Secondary | ICD-10-CM

## 2018-04-01 ENCOUNTER — Encounter: Payer: Self-pay | Admitting: Nurse Practitioner

## 2018-04-18 ENCOUNTER — Encounter: Payer: Self-pay | Admitting: Dietician

## 2018-04-18 ENCOUNTER — Encounter: Payer: Medicare Other | Attending: Nurse Practitioner | Admitting: Dietician

## 2018-04-18 VITALS — BP 128/78 | Ht 66.0 in | Wt 176.4 lb

## 2018-04-18 DIAGNOSIS — Z713 Dietary counseling and surveillance: Secondary | ICD-10-CM | POA: Insufficient documentation

## 2018-04-18 DIAGNOSIS — E1165 Type 2 diabetes mellitus with hyperglycemia: Secondary | ICD-10-CM | POA: Diagnosis not present

## 2018-04-18 DIAGNOSIS — Z6828 Body mass index (BMI) 28.0-28.9, adult: Secondary | ICD-10-CM | POA: Insufficient documentation

## 2018-04-18 DIAGNOSIS — E119 Type 2 diabetes mellitus without complications: Secondary | ICD-10-CM

## 2018-04-18 NOTE — Patient Instructions (Addendum)
  Check blood sugars 2 x day before breakfast and 2 hrs after supper every day and record  Bring blood sugar records to the next appointment/class  Exercise: do stepper or elliptical 15 min 3 days/wk  Eat 3 meals day and   2-3 snacks a day  Space meals 4-5 hours apart  Eat 2-3 carbohydrate servings/meal + protein  Eat 1 carbohydrate serving/snack + protein  Avoid sugar sweetened drinks (soda, tea, coffee, sports drinks, juices)  Limit intake of sweets, desserts and fried foods  Make dentist / eye doctor appointments  Get a Sharps container  Carry fast acting glucose and a snack at all times  Return for appointment/classes on:  05-12-18

## 2018-04-18 NOTE — Progress Notes (Signed)
Diabetes Self-Management Education  Visit Type: First/Initial  Appt. Start Time: 1000 Appt. End Time: 1115  04/18/2018  [name], identified by name and date of birth, is a 68 y.o. female with a diagnosis of Diabetes: Type 2.   ASSESSMENT  Blood pressure 128/78, height 5\' 6"  (1.676 m), weight 176 lb 6.4 oz (80 kg). Body mass index is 28.47 kg/m.  Diabetes Self-Management Education - 04/18/18 1204      Visit Information   Visit Type  First/Initial      Initial Visit   Diabetes Type  Type 2      Health Coping   How would you rate your overall health?  Good      Psychosocial Assessment   Patient Belief/Attitude about Diabetes  Motivated to manage diabetes    Self-care barriers  Hard of hearing    Self-management support  Doctor's office    Other persons present  Patient    Patient Concerns  Glycemic Control    Special Needs  None    Preferred Learning Style  Visual    Learning Readiness  Ready    What is the last grade level you completed in school?  BS degree in science      Pre-Education Assessment   Patient understands the diabetes disease and treatment process.  Needs Review    Patient understands incorporating nutritional management into lifestyle.  Needs Review    Patient undertands incorporating physical activity into lifestyle.  Needs Review    Patient understands using medications safely.  Needs Review    Patient understands monitoring blood glucose, interpreting and using results  Needs Review    Patient understands prevention, detection, and treatment of acute complications.  Needs Review    Patient understands prevention, detection, and treatment of chronic complications.  Needs Review    Patient understands how to develop strategies to address psychosocial issues.  Needs Review    Patient understands how to develop strategies to promote health/change behavior.  Needs Review      Complications   Last HgB A1C per patient/outside source  11.8 %    02-11-18   How often do you check your blood sugar?  1-2 times/day (reports occasional shaky spells)   Fasting Blood glucose range (mg/dL)  130-179    Have you had a dilated eye exam in the past 12 months?  No   04-2016   Have you had a dental exam in the past 12 months?  No   unknown-years ago   Are you checking your feet?  Yes -c/o extreme itching of feet and dryness   How many days per week are you checking your feet?  3      Dietary Intake   Breakfast  eats breakfast at 7a-8a=egg beaters with spinach and onions or oatmeal    Snack (morning)  eats tortilla with cheese and vegetables + occasional fruit at 10a    Lunch  eats lunch at 12p-1p=tortilla with chicken and veggies    Snack (afternoon)  eats sugar free pudding or jello, glucerna or slim fast snack bar or ice cream at 4p    Dinner  eats supper at 7p-8p=meat and vegetables or spaghetti     Snack (evening)  eats nuts, boiled egg  or slim fast snack bar at 10-10:30p    Beverage(s)  drinks water 4-5x/day, diet soda/sugar free drinks 3x/day + occasional milk      Exercise   Exercise Type  ADL's      Patient  Education   Previous Diabetes Education  No    Disease state   Explored patient's options for treatment of their diabetes;Definition of diabetes, type 1 and 2, and the diagnosis of diabetes    Nutrition management   Role of diet in the treatment of diabetes and the relationship between the three main macronutrients and blood glucose level;Food label reading, portion sizes and measuring food.;Carbohydrate counting    Physical activity and exercise   Role of exercise on diabetes management, blood pressure control and cardiac health.;Helped patient identify appropriate exercises in relation to his/her diabetes, diabetes complications and other health issue.    Medications  Reviewed patients medication for diabetes, action, purpose, timing of dose and side effects.    Monitoring  Purpose and frequency of SMBG.;Identified appropriate  SMBG and/or A1C goals.;Yearly dilated eye exam;Taught/discussed recording of test results and interpretation of SMBG.    Acute complications  Taught treatment of hypoglycemia - the 15 rule.    Chronic complications  Relationship between chronic complications and blood glucose control;Dental care;Retinopathy and reason for yearly dilated eye exams;Reviewed with patient heart disease, higher risk of, and prevention;Lipid levels, blood glucose control and heart disease    Personal strategies to promote health  Lifestyle issues that need to be addressed for better diabetes care;Helped patient develop diabetes management plan for (enter comment)      Outcomes   Expected Outcomes  Demonstrated interest in learning. Expect positive outcomes       Individualized Plan for Diabetes Self-Management Training:   Learning Objective:  Patient will have a greater understanding of diabetes self-management. Patient education plan is to attend individual and/or group sessions per assessed needs and concerns.   Plan:   Patient Instructions   Check blood sugars 2 x day before breakfast and 2 hrs after supper every day and record  Bring blood sugar records to the next appointment/class  Exercise: do stepper or elliptical 15 min 3 days/wk  Eat 3 meals day and   2-3 snacks a day  Space meals 4-5 hours apart  Eat 2-3 carbohydrate servings/meal + protein  Eat 1 carbohydrate serving/snack + protein  Avoid sugar sweetened drinks (soda, tea, coffee, sports drinks, juices)  Limit intake of sweets, desserts and fried foods  Make dentist / eye doctor appointments  Get a [place] container  Carry fast acting glucose and a snack at all times  Return for appointment/classes on:  05-12-18   Expected Outcomes:  Demonstrated interest in learning. Expect positive outcomes  Education material provided: General meal planning guidelines, Food Group handout, low BG handout, 1 tube glucose tablets for PRN use  If  problems or questions, patient to contact team via: [phone]  Future DSME appointment:  05-12-18

## 2018-04-21 ENCOUNTER — Encounter: Payer: Self-pay | Admitting: Nurse Practitioner

## 2018-04-21 DIAGNOSIS — E1165 Type 2 diabetes mellitus with hyperglycemia: Secondary | ICD-10-CM

## 2018-04-21 MED ORDER — ACCU-CHEK SOFT TOUCH LANCETS MISC: 12 refills | Status: DC

## 2018-04-21 MED ORDER — GLUCOSE BLOOD VI STRP: ORAL_STRIP | 12 refills | Status: DC

## 2018-04-21 NOTE — Telephone Encounter (Signed)
Mychart

## 2018-05-11 ENCOUNTER — Encounter: Payer: Self-pay | Admitting: Nurse Practitioner

## 2018-05-11 ENCOUNTER — Other Ambulatory Visit: Payer: Self-pay

## 2018-05-11 ENCOUNTER — Ambulatory Visit (INDEPENDENT_AMBULATORY_CARE_PROVIDER_SITE_OTHER): Payer: Medicare Other | Admitting: Nurse Practitioner

## 2018-05-11 VITALS — BP 117/60 | HR 61 | Temp 98.1°F | Ht 66.0 in | Wt 172.0 lb

## 2018-05-11 DIAGNOSIS — E1165 Type 2 diabetes mellitus with hyperglycemia: Secondary | ICD-10-CM | POA: Diagnosis not present

## 2018-05-11 DIAGNOSIS — E1129 Type 2 diabetes mellitus with other diabetic kidney complication: Secondary | ICD-10-CM | POA: Diagnosis not present

## 2018-05-11 DIAGNOSIS — R809 Proteinuria, unspecified: Secondary | ICD-10-CM | POA: Diagnosis not present

## 2018-05-11 LAB — POCT GLYCOSYLATED HEMOGLOBIN (HGB A1C): Hemoglobin A1C: 6.7 % — AB (ref 4.0–5.6)

## 2018-05-11 LAB — POCT UA - MICROALBUMIN: Microalbumin Ur, POC: 50 mg/L

## 2018-05-11 MED ORDER — LISINOPRIL 5 MG PO TABS: 5.0000 mg | ORAL_TABLET | Freq: Every day | ORAL | 3 refills | Status: DC

## 2018-05-11 NOTE — Patient Instructions (Addendum)
Maria Sloan,   Thank you for coming in to clinic today.  1. Continue current medications.   - Take medications with you when you are leaving home for supper.  If needed, take when you get back home after supper.  2. Continue with once daily sugar checks in am.  Finish what Maria Sloan requests in addition to morning sugars.  Please schedule a follow-up appointment with [name], AGNP. Return in about 3 months (around 08/11/2018) for diabetes.  If you have any other questions or concerns, please feel free to call the clinic or send a message through [place]. You may also schedule an earlier appointment if necessary.  You will receive a survey after today's visit either digitally by e-mail or paper by [employer]. Your experiences and feedback matter to [place].  Please respond so we know how we are doing as we provide care for you.  [name], DNP, AGNP-BC Adult Gerontology Nurse Practitioner [place]

## 2018-05-11 NOTE — Progress Notes (Signed)
Subjective:    Patient ID: Maria Sloan, female    DOB: 1950/03/14, 68 y.o.   MRN: [id number]  Maria Sloan is a 68 y.o. female presenting on 05/11/2018 for Diabetes   HPI Diabetes Pt presents today for follow up of Type 2 diabetes mellitus. She is checking fasting am CBG at home with a range of 130-160, usually 130-140 - Current diabetic medications include: Metformin 2,000 mg bid, Farxiga 10 mg daily, and glimepiride 2 mg once daily - She is not currently symptomatic.  - She denies polydipsia, polyphagia, polyuria, headaches, diaphoresis, shakiness, chills, pain, numbness or tingling in extremities and changes in vision.   - Clinical course has been improving. - She  reports no regular exercise routine - Her diet is moderate in salt, moderate in fat, and moderate in carbohydrates.  Patient is watching her snacks and having carb plus protein with snacks.   - Weight trend: decreasing steadily  PREVENTION: Eye exam current (within one year): no Foot exam current (within one year): no Lipid/ASCVD risk reduction - on statin: yes Kidney protection - on ace or arb: yes Recent Labs    02/11/18 0838 05/11/18 0845  HGBA1C 11.8* 6.7*   Social History   Tobacco Use  . Smoking status: Former Smoker    Packs/day: 1.00    Years: 25.00    Pack years: 25.00    Types: Cigarettes    Last attempt to quit: 11/29/1994    Years since quitting: 23.4  . Smokeless tobacco: Never Used  Substance Use Topics  . Alcohol use: Never    Alcohol/week: 0.0 standard drinks    Frequency: Never  . Drug use: Never    Review of Systems Per HPI unless specifically indicated above     Objective:    BP 117/60 (BP Location: Left Arm, Patient Position: Sitting, Cuff Size: Normal)   Pulse 61   Temp 98.1 F (36.7 C) (Oral)   Ht 5\' 6"  (1.676 m)   Wt 172 lb (78 kg)   BMI 27.76 kg/m   Wt Readings from Last 3 Encounters:  05/11/18 172 lb (78 kg)  04/18/18 176 lb 6.4 oz (80 kg)  03/30/18 178 lb (80.7  kg)    Physical Exam Vitals signs reviewed.  Constitutional:      General: She is awake. She is not in acute distress.    Appearance: She is well-developed.  HENT:     Head: Normocephalic and atraumatic.  Neck:     Musculoskeletal: Normal range of motion and neck supple.     Vascular: No carotid bruit.  Cardiovascular:     Rate and Rhythm: Normal rate and regular rhythm.     Pulses:          Radial pulses are 2+ on the right side and 2+ on the left side.       Posterior tibial pulses are 1+ on the right side and 1+ on the left side.     Heart sounds: Normal heart sounds, S1 normal and S2 normal.  Pulmonary:     Effort: Pulmonary effort is normal. No respiratory distress.     Breath sounds: Normal breath sounds and air entry.  Musculoskeletal:     Right lower leg: No edema.     Left lower leg: No edema.  Skin:    General: Skin is warm and dry.     Capillary Refill: Capillary refill takes less than 2 seconds.  Neurological:     General: No focal deficit present.  Mental Status: She is alert and oriented to person, place, and time. Mental status is at baseline.  Psychiatric:        Attention and Perception: Attention normal.        Mood and Affect: Mood and affect normal.        Behavior: Behavior normal. Behavior is cooperative.        Thought Content: Thought content normal.        Judgment: Judgment normal.     Diabetic Foot Exam - Simple   Simple Foot Form Diabetic Foot exam was performed with the following findings:  Yes 05/11/2018  9:00 AM  Visual Inspection No deformities, no ulcerations, no other skin breakdown bilaterally:  Yes Sensation Testing Intact to touch and monofilament testing bilaterally:  Yes Pulse Check Posterior Tibialis and Dorsalis pulse intact bilaterally:  Yes Comments     Results for orders placed or performed in visit on 02/11/18  COMPLETE METABOLIC PANEL WITH GFR  Result Value Ref Range   Glucose, Bld 281 (H) 65 - 99 mg/dL   BUN 14 7 -  25 mg/dL   Creat 0.61 0.50 - 0.99 mg/dL   GFR, Est Non African American 94 > OR = 60 mL/min/1.32m2   GFR, Est African American 109 > OR = 60 mL/min/1.45m2   BUN/Creatinine Ratio NOT APPLICABLE 6 - 22 (calc)   Sodium 136 135 - 146 mmol/L   Potassium 4.5 3.5 - 5.3 mmol/L   Chloride 99 98 - 110 mmol/L   CO2 29 20 - 32 mmol/L   Calcium 9.6 8.6 - 10.4 mg/dL   Total Protein 7.5 6.1 - 8.1 g/dL   Albumin 4.4 3.6 - 5.1 g/dL   Globulin 3.1 1.9 - 3.7 g/dL (calc)   AG Ratio 1.4 1.0 - 2.5 (calc)   Total Bilirubin 0.7 0.2 - 1.2 mg/dL   Alkaline phosphatase (APISO) 73 33 - 130 U/L   AST 31 10 - 35 U/L   ALT 34 (H) 6 - 29 U/L  Lipid panel  Result Value Ref Range   Cholesterol 146 <200 mg/dL   HDL 37 (L) >50 mg/dL   Triglycerides 279 (H) <150 mg/dL   LDL Cholesterol (Calc) 72 mg/dL (calc)   Total CHOL/HDL Ratio 3.9 <5.0 (calc)   Non-HDL Cholesterol (Calc) 109 <130 mg/dL (calc)  TSH  Result Value Ref Range   TSH 0.80 0.40 - 4.50 mIU/L  POCT glycosylated hemoglobin (Hb A1C)  Result Value Ref Range   Hemoglobin A1C 11.8 (A) 4.0 - 5.6 %   HbA1c POC (<> result, manual entry)     HbA1c, POC (prediabetic range)     HbA1c, POC (controlled diabetic range)        Assessment & Plan:   Problem List Items Addressed This Visit      Endocrine   Type 2 diabetes mellitus with hyperglycemia (HCC) - Primary   Relevant Medications   lisinopril (PRINIVIL,ZESTRIL) 5 MG tablet   Other Relevant Orders   POCT glycosylated hemoglobin (Hb A1C) (Completed)   POCT UA - Microalbumin (Completed)    Other Visit Diagnoses    Microalbuminuria due to type 2 diabetes mellitus (HCC)       Relevant Medications   lisinopril (PRINIVIL,ZESTRIL) 5 MG tablet    Controlled T2DM with A1c 6.7% improved from >11% and goal A1c < 7.0%.  Patient has started regularly attending her DM education [facility]. - Complications - dyslipidemia, proteinuria and hyperglycemia.  Plan:  1. Continue current therapy:  Metformin, glimepiride,  2. Encourage improved lifestyle: - low carb/low glycemic diet reinforced prior education - Increase physical activity to 30 minutes most days of the week.  Explained that increased physical activity increases body's use of sugar for energy. 3. Check fasting am CBG and bring log to next visit for review.   4. Patient needs urine microalbumin -= 50 - START ACEi.  Continue Statin 5. DM Foot exam done today with norma. findings.   and Advised to schedule DM ophtho exam, send record. 6. Follow-up 3 months     Meds ordered this encounter  Medications  . lisinopril (PRINIVIL,ZESTRIL) 5 MG tablet    Sig: Take 1 tablet (5 mg total) by mouth daily.    Dispense:  90 tablet    Refill:  3    Order Specific Question:   Supervising Provider    Answer:   Olin Hauser [[id number]]   Follow up plan: Return in about 3 months (around 08/11/2018) for diabetes.  [name], DNP, AGPCNP-BC Adult Gerontology Primary Care Nurse Practitioner [facility] 05/11/2018, 8:46 AM

## 2018-05-12 ENCOUNTER — Encounter: Payer: Self-pay | Admitting: Dietician

## 2018-05-12 ENCOUNTER — Encounter: Payer: Medicare Other | Attending: Nurse Practitioner | Admitting: Dietician

## 2018-05-12 VITALS — Ht 65.0 in | Wt 175.4 lb

## 2018-05-12 DIAGNOSIS — E1165 Type 2 diabetes mellitus with hyperglycemia: Secondary | ICD-10-CM | POA: Insufficient documentation

## 2018-05-12 DIAGNOSIS — Z6828 Body mass index (BMI) 28.0-28.9, adult: Secondary | ICD-10-CM | POA: Diagnosis not present

## 2018-05-12 DIAGNOSIS — Z713 Dietary counseling and surveillance: Secondary | ICD-10-CM | POA: Diagnosis not present

## 2018-05-12 DIAGNOSIS — E119 Type 2 diabetes mellitus without complications: Secondary | ICD-10-CM

## 2018-05-12 NOTE — Progress Notes (Signed)

## 2018-05-14 ENCOUNTER — Encounter: Payer: Self-pay | Admitting: Nurse Practitioner

## 2018-05-19 ENCOUNTER — Encounter: Payer: Self-pay | Admitting: *Deleted

## 2018-05-19 ENCOUNTER — Encounter: Payer: Medicare Other | Admitting: *Deleted

## 2018-05-19 ENCOUNTER — Other Ambulatory Visit: Payer: Self-pay

## 2018-05-19 VITALS — Wt 173.7 lb

## 2018-05-19 DIAGNOSIS — Z713 Dietary counseling and surveillance: Secondary | ICD-10-CM | POA: Diagnosis not present

## 2018-05-19 DIAGNOSIS — E119 Type 2 diabetes mellitus without complications: Secondary | ICD-10-CM

## 2018-05-19 DIAGNOSIS — E1165 Type 2 diabetes mellitus with hyperglycemia: Secondary | ICD-10-CM | POA: Diagnosis not present

## 2018-05-19 DIAGNOSIS — Z6828 Body mass index (BMI) 28.0-28.9, adult: Secondary | ICD-10-CM | POA: Diagnosis not present

## 2018-05-19 NOTE — Progress Notes (Signed)

## 2018-05-26 ENCOUNTER — Other Ambulatory Visit: Payer: Self-pay

## 2018-05-26 ENCOUNTER — Encounter: Payer: Medicare Other | Admitting: Dietician

## 2018-05-26 ENCOUNTER — Encounter: Payer: Self-pay | Admitting: Dietician

## 2018-05-26 VITALS — Wt 172.1 lb

## 2018-05-26 DIAGNOSIS — E119 Type 2 diabetes mellitus without complications: Secondary | ICD-10-CM

## 2018-05-26 DIAGNOSIS — Z6828 Body mass index (BMI) 28.0-28.9, adult: Secondary | ICD-10-CM | POA: Diagnosis not present

## 2018-05-26 DIAGNOSIS — Z713 Dietary counseling and surveillance: Secondary | ICD-10-CM | POA: Diagnosis not present

## 2018-05-26 DIAGNOSIS — E1165 Type 2 diabetes mellitus with hyperglycemia: Secondary | ICD-10-CM | POA: Diagnosis not present

## 2018-05-26 NOTE — Progress Notes (Signed)
Appt. Start Time: 9:00am Appt. End Time: 12:00  Class 3 Diabetes Overview - identify functions of pancreas and insulin; define insulin deficiency vs insulin resistance  Medications - state name, dose, timing of currently prescribed medications; describe types of medications available for diabetes  Psychosocial - identify DM as a source of stress; state the effects of stress on BG control; verbalize appropriate stress management techniques; identify personal stress issues   Nutritional Management - use food labels to identify serving size, content of carbohydrate, fiber, protein, fat, saturated fat and sodium; recognize food sources of fat, saturated fat, trans fat, and sodium, and verbalize goals for intake; describe healthful, appropriate food choices when dining out   Exercise - state a plan for personal exercise; verbalize contraindications for exercise  Self-Monitoring - state importance of SMBG; use SMBG results to effectively manage diabetes; identify importance of regular HbA1C testing and goals for results  Acute Complications - recognize hyperglycemia and hypoglycemia with causes and effects; identify blood glucose results as high, low or in control; list steps in treating and preventing high and low blood glucose  Chronic Complications - state importance of daily self-foot exams; explain appropriate eye and dental care  Lifestyle Changes/Goals & Health/Community Resources - set goals for proper diabetes care; state need for and frequency of healthcare follow-up; describe appropriate community resources for good health (ADA, web sites, apps)   Teaching Materials Used: Class 3 Slide Packet Diabetes Stress Test Stress Management Tools Stress Poem Goal Setting Worksheet Website/App List    

## 2018-05-30 ENCOUNTER — Encounter: Payer: Self-pay | Admitting: Dietician

## 2018-06-19 ENCOUNTER — Other Ambulatory Visit: Payer: Self-pay | Admitting: Nurse Practitioner

## 2018-06-19 DIAGNOSIS — F329 Major depressive disorder, single episode, unspecified: Secondary | ICD-10-CM

## 2018-06-19 DIAGNOSIS — F32A Depression, unspecified: Secondary | ICD-10-CM

## 2018-06-19 DIAGNOSIS — F419 Anxiety disorder, unspecified: Principal | ICD-10-CM

## 2018-06-19 DIAGNOSIS — E782 Mixed hyperlipidemia: Secondary | ICD-10-CM

## 2018-08-08 ENCOUNTER — Other Ambulatory Visit: Payer: Self-pay | Admitting: Nurse Practitioner

## 2018-08-08 DIAGNOSIS — E1165 Type 2 diabetes mellitus with hyperglycemia: Secondary | ICD-10-CM

## 2018-08-12 ENCOUNTER — Telehealth: Payer: Self-pay

## 2018-08-12 DIAGNOSIS — E1165 Type 2 diabetes mellitus with hyperglycemia: Secondary | ICD-10-CM

## 2018-08-12 NOTE — Telephone Encounter (Signed)
Only needs A1c. Signed order.  Nobie Putnam, [facility] 08/12/2018, 1:27 PM

## 2018-08-12 NOTE — Telephone Encounter (Signed)
The pt is on the schedule for labs on Monday. Can you please verify the labs she needs prior to hr appt.

## 2018-08-15 ENCOUNTER — Other Ambulatory Visit: Payer: Medicare Other

## 2018-08-15 DIAGNOSIS — E1165 Type 2 diabetes mellitus with hyperglycemia: Secondary | ICD-10-CM | POA: Diagnosis not present

## 2018-08-16 ENCOUNTER — Ambulatory Visit (INDEPENDENT_AMBULATORY_CARE_PROVIDER_SITE_OTHER): Payer: Medicare Other | Admitting: Family Medicine

## 2018-08-16 ENCOUNTER — Other Ambulatory Visit: Payer: Self-pay

## 2018-08-16 ENCOUNTER — Encounter: Payer: Self-pay | Admitting: Family Medicine

## 2018-08-16 ENCOUNTER — Ambulatory Visit: Payer: Medicare Other | Admitting: Nurse Practitioner

## 2018-08-16 DIAGNOSIS — R05 Cough: Secondary | ICD-10-CM | POA: Diagnosis not present

## 2018-08-16 DIAGNOSIS — R0982 Postnasal drip: Secondary | ICD-10-CM

## 2018-08-16 DIAGNOSIS — E1165 Type 2 diabetes mellitus with hyperglycemia: Secondary | ICD-10-CM

## 2018-08-16 DIAGNOSIS — R053 Chronic cough: Secondary | ICD-10-CM

## 2018-08-16 LAB — HEMOGLOBIN A1C
Hgb A1c MFr Bld: 6.1 % of total Hgb — ABNORMAL HIGH (ref <5.7)
Mean Plasma Glucose: 128 (calc)
eAG (mmol/L): 7.1 (calc)

## 2018-08-16 MED ORDER — FLUTICASONE PROPIONATE 50 MCG/ACT NA SUSP: 2.0000 | Freq: Every day | NASAL | 2 refills | Status: DC

## 2018-08-16 NOTE — Patient Instructions (Addendum)
Recent Labs    02/11/18 0838 05/11/18 0845 08/15/18 0804  HGBA1C 11.8* 6.7* 6.1*    Keep up good work with A1c improving. Some sugars still high is okay, check 2 hours AFTER lunch and also bedtime - occasionally these can help [place] learn more, bring to your next visit with Lauren.  No change to medicines today.  For drainage/cough Start nasal steroid Flonase 2 sprays in each nostril daily for 4-6 weeks, may repeat course seasonally or as needed  Notify us if worsening or new complaint.  If change mind and if toe is worsening, can notify us if you need an X-ray or go to Urgent Care  DUE for FASTING BLOOD WORK (no food or drink after midnight before the lab appointment, only water or coffee without cream/sugar on the morning of)  SCHEDULE "Lab Only" visit in the morning at the clinic for lab draw in 6 MONTHS   - Make sure Lab Only appointment is at about 1 week before your next appointment, so that results will be available  For Lab Results, once available within 2-3 days of blood draw, you can can log in to MyChart online to view your results and a brief explanation. Also, we can discuss results at next follow-up visit.   Please schedule a Follow-up Appointment to: Return in about 6 months (around 02/15/2019) for Yearly Medicare Checkup.  If you have any other questions or concerns, please feel free to call the office or send a message through [place]. You may also schedule an earlier appointment if necessary.  Additionally, you may be receiving a survey about your experience at our office within a few days to 1 week by e-mail or mail. We value your feedback.  Nobie Putnam, DO [place]

## 2018-08-16 NOTE — Progress Notes (Signed)
Virtual Visit via Telephone The purpose of this virtual visit is to provide medical care while limiting exposure to the novel coronavirus (COVID19) for both patient and office staff.  Consent was obtained for phone visit:  Yes.   Answered questions that patient had about telehealth interaction:  Yes.   I discussed the limitations, risks, security and privacy concerns of performing an evaluation and management service by telephone. I also discussed with the patient that there may be a patient responsible charge related to this service. The patient expressed understanding and agreed to proceed.  Patient Location: Home Provider Location: [facility] (Office)  PCP is [name], AGPCNP-BC - I am currently covering during her maternity leave.   ---------------------------------------------------------------------- Chief Complaint  Patient presents with  . Diabetes  . Cough    onset month and half ago, worst at night denies fever and SOB but persistent cough     S: Reviewed CMA documentation. I have called patient and gathered additional HPI as follows:  CHRONIC DM, Type 2: Reports concerns with some elevated sugars at times, but pleased with A1c 6.1 result today, had improved from prior 6.7 and prior 11. She admits some inactivity and more sedentary due to coronavirus pandemic. CBGs: Avg 140s, High >170. Checks CBGs 1 x daily in AM Meds: Metformin 2,000 mg bid, Farxiga 10 mg daily, and Glimepiride 2 mg once daily Reports good compliance. Tolerating well w/o side-effects Currently on ACEi Lifestyle: - Diet (improving diet, not always adhering to low carb)  - Exercise (more sedentary now) Denies hypoglycemia, polyuria, visual changes, numbness or tingling.  DRY COUGH / POST NASAL Reports that symptoms started about 1 month ago with dry nagging cough, worse at night but occasional during the day. Not tried OTC medication yet. Has used flonase before  Denies any high  risk travel to areas of current concern for COVID19. Denies any known or suspected exposure to person with or possibly with COVID19.  Denies any fevers, chills, sweats, body ache, shortness of breath, sinus pain or pressure, headache, abdominal pain, diarrhea  Additional complaint Toe injury - stubbed toe, with bruising and pain, thinks she may have broken it, has done this before, usually will heal, she used buddy tape, declines any other treatment or x-ray at this time.  Past Medical History:  Diagnosis Date  . Anxiety   . Arthritis   . Cervical cancer ([place])   . Hyperlipidemia   . PVD (peripheral vascular disease) (HCC)    R leg venous insufficiency  . PVD (peripheral vascular disease) ([place])   . Shingles   . Type 2 diabetes mellitus (HCC)    Social History   Tobacco Use  . Smoking status: Former Smoker    Packs/day: 1.00    Years: 25.00    Pack years: 25.00    Types: Cigarettes    Quit date: 11/29/1994    Years since quitting: 23.7  . Smokeless tobacco: Former Network engineer Use Topics  . Alcohol use: Never    Alcohol/week: 0.0 standard drinks    Frequency: Never  . Drug use: Never    Current Outpatient Medications:  .  atorvastatin (LIPITOR) 20 MG tablet, TAKE 1 TABLET BY MOUTH EVERY DAY, Disp: 90 tablet, Rfl: 1 .  baclofen (LIORESAL) 10 MG tablet, Take 1 tablet (10 mg total) by mouth at bedtime as needed for muscle spasms., Disp: 20 each, Rfl: 1 .  Blood Glucose Monitoring Suppl (ACCU-CHEK AVIVA PLUS) w/Device KIT, 1 Device by Does  not apply route as directed. . One time daily as directed. FOR ICD-10 E10.9, E11.9 A1C 02/11/2018- 11.8%, Disp: 1 kit, Rfl: 0 .  CALCIUM CITRATE PO, Take 1 tablet by mouth daily. , Disp: , Rfl:  .  Cholecalciferol (VITAMIN D PO), Take 1 tablet by mouth daily. , Disp: , Rfl:  .  citalopram (CELEXA) 20 MG tablet, TAKE 1 TABLET BY MOUTH EVERY DAY, Disp: 90 tablet, Rfl: 1 .  ezetimibe (ZETIA) 10 MG tablet, TAKE 1 TABLET BY MOUTH EVERY DAY,  Disp: 90 tablet, Rfl: 0 .  FARXIGA 10 MG TABS tablet, TAKE 1 TABLET BY MOUTH DAILY, Disp: 30 tablet, Rfl: 2 .  glimepiride (AMARYL) 2 MG tablet, Take 1 tablet (2 mg total) by mouth daily with breakfast., Disp: 30 tablet, Rfl: 5 .  glucose blood (ACCU-CHEK AVIVA PLUS) test strip, Use up to two times daily as directed. FOR ICD-10 E10.9, E11.9 A1C 02/11/2018- 11.8%, Disp: 100 each, Rfl: 12 .  Lancets (ACCU-CHEK SOFT TOUCH) lancets, Use TWO times daily as directed. FOR ICD-10 E10.9, E11.9 A1C 02/11/2018- 11.8%, Disp: 100 each, Rfl: 12 .  lisinopril (PRINIVIL,ZESTRIL) 5 MG tablet, Take 1 tablet (5 mg total) by mouth daily., Disp: 90 tablet, Rfl: 3 .  metFORMIN (GLUCOPHAGE) 1000 MG tablet, Take 1 tablet (1,000 mg total) by mouth 2 (two) times daily with a meal., Disp: 180 tablet, Rfl: 1 .  Multiple Vitamin (MULTIVITAMIN) tablet, Take 1 tablet by mouth daily., Disp: , Rfl:  .  Omega-3 Fatty Acids (FISH OIL) 1000 MG CAPS, Take 1,000 mg by mouth 3 (three) times daily., Disp: , Rfl:  .  Potassium 75 MG TABS, Take 1 tablet by mouth daily., Disp: , Rfl:  .  fluticasone (FLONASE) 50 MCG/ACT nasal spray, Place 2 sprays into both nostrils daily. Use for 4-6 weeks then stop and use seasonally or as needed., Disp: 16 g, Rfl: 2  Depression screen [facility] 2/9 08/16/2018 05/11/2018 04/18/2018  Decreased Interest 0 0 0  Down, Depressed, Hopeless 1 0 0  PHQ - 2 Score 1 0 0  Altered sleeping 0 1 -  Tired, decreased energy 0 0 -  Change in appetite 0 0 -  Feeling bad or failure about yourself  1 0 -  Trouble concentrating 0 0 -  Moving slowly or fidgety/restless 0 0 -  Suicidal thoughts 0 0 -  PHQ-9 Score 2 1 -  Difficult doing work/chores Not difficult at all Not difficult at all -    GAD 7 : Generalized Anxiety Score 08/16/2018 12/30/2017  Nervous, Anxious, on Edge 1 0  Control/stop worrying 0 0  Worry too much - different things 1 0  Trouble relaxing 0 0  Restless 0 0  Easily annoyed or irritable 1 1  Afraid -  awful might happen 0 1  Total GAD 7 Score 3 2  Anxiety Difficulty Not difficult at all Somewhat difficult    -------------------------------------------------------------------------- O: No physical exam performed due to remote telephone encounter.  Lab results reviewed.  Recent Labs    02/11/18 0838 05/11/18 0845 08/15/18 0804  HGBA1C 11.8* 6.7* 6.1*     Recent Results (from the past 2160 hour(s))  Hemoglobin A1c     Status: Abnormal   Collection Time: 08/15/18  8:04 AM  Result Value Ref Range   Hgb A1c MFr Bld 6.1 (H) <5.7 % of total Hgb    Comment: For someone without known diabetes, a hemoglobin  A1c value between 5.7% and 6.4% is consistent with prediabetes  and should be confirmed with a  follow-up test. . For someone with known diabetes, a value <7% indicates that their diabetes is well controlled. A1c targets should be individualized based on duration of diabetes, age, comorbid conditions, and other considerations. . This assay result is consistent with an increased risk of diabetes. . Currently, no consensus exists regarding use of hemoglobin A1c for diagnosis of diabetes for children. .    Mean Plasma Glucose 128 (calc)   eAG (mmol/L) 7.1 (calc)    -------------------------------------------------------------------------- A&P:  Problem List Items Addressed This Visit    Type 2 diabetes mellitus with hyperglycemia ([place])    Well-controlled DM with A1c improved to 6.1, from prior 6.7 to 11. No hypoglycemia. Complications - occasional (improved) Hyperglycemia, hyperlipidemia, PVD, Depression - increases risk of future cardiovascular complications   Plan:  1. Continue current therapy - Metformin 1013m BID, Farxiga 154mdaily, Glimepiride 43m443maily 2. Encourage improved lifestyle - low carb, low sugar diet, reduce portion size, continue improving regular exercise 3. Check CBG, bring log to next visit for review - may check additional readings not just  fasting 4. Continue ACEi, Statin 5. Follow-up 6 months        Other Visit Diagnoses    Persistent dry cough    -  Primary   Relevant Medications   fluticasone (FLONASE) 50 MCG/ACT nasal spray   Post-nasal drainage       Relevant Medications   fluticasone (FLONASE) 50 MCG/ACT nasal spray    Likely persistent dry cough from post nasal drainage, based on history Limited other symptoms or concerns at this time, unlikely COVID19. No sign of infection Reassurance Start nasal steroid Flonase 2 sprays in each nostril daily for 4-6 weeks, may repeat course seasonally or as needed Followup if not improved  Meds ordered this encounter  Medications  . fluticasone (FLONASE) 50 MCG/ACT nasal spray    Sig: Place 2 sprays into both nostrils daily. Use for 4-6 weeks then stop and use seasonally or as needed.    Dispense:  16 g    Refill:  2    Follow-up: - Return in 6 months for Yearly Medicare Checkup w/ PCP  Patient verbalizes understanding with the above medical recommendations including the limitation of remote medical advice.  Specific follow-up and call-back criteria were given for patient to follow-up or seek medical care more urgently if needed.   - Time spent in direct consultation with patient on phone: 15 minutes  AleNobie PutnamO [place] 08/16/2018, 1:57 PM

## 2018-08-16 NOTE — Assessment & Plan Note (Signed)
Well-controlled DM with A1c improved to 6.1, from prior 6.7 to 11. No hypoglycemia. Complications - occasional (improved) Hyperglycemia, hyperlipidemia, PVD, Depression - increases risk of future cardiovascular complications   Plan:  1. Continue current therapy - Metformin 1000mg  BID, Farxiga 10mg  daily, Glimepiride 2mg  daily 2. Encourage improved lifestyle - low carb, low sugar diet, reduce portion size, continue improving regular exercise 3. Check CBG, bring log to next visit for review - may check additional readings not just fasting 4. Continue ACEi, Statin 5. Follow-up 6 months

## 2018-09-18 ENCOUNTER — Other Ambulatory Visit: Payer: Self-pay | Admitting: Nurse Practitioner

## 2018-09-18 DIAGNOSIS — E1165 Type 2 diabetes mellitus with hyperglycemia: Secondary | ICD-10-CM

## 2018-11-01 ENCOUNTER — Other Ambulatory Visit: Payer: Self-pay | Admitting: Family Medicine

## 2018-11-01 DIAGNOSIS — E1165 Type 2 diabetes mellitus with hyperglycemia: Secondary | ICD-10-CM

## 2018-11-08 ENCOUNTER — Other Ambulatory Visit: Payer: Self-pay | Admitting: Family Medicine

## 2018-11-08 DIAGNOSIS — R0982 Postnasal drip: Secondary | ICD-10-CM

## 2018-11-08 DIAGNOSIS — R053 Chronic cough: Secondary | ICD-10-CM

## 2018-11-08 DIAGNOSIS — R05 Cough: Secondary | ICD-10-CM

## 2018-11-10 DIAGNOSIS — E119 Type 2 diabetes mellitus without complications: Secondary | ICD-10-CM | POA: Diagnosis not present

## 2018-11-10 LAB — HM DIABETES EYE EXAM

## 2018-11-21 ENCOUNTER — Encounter: Payer: Self-pay | Admitting: Nurse Practitioner

## 2018-11-22 ENCOUNTER — Encounter: Payer: Self-pay | Admitting: Nurse Practitioner

## 2018-11-22 DIAGNOSIS — E1129 Type 2 diabetes mellitus with other diabetic kidney complication: Secondary | ICD-10-CM | POA: Insufficient documentation

## 2018-11-28 ENCOUNTER — Other Ambulatory Visit: Payer: Self-pay | Admitting: Nurse Practitioner

## 2018-11-28 DIAGNOSIS — E1165 Type 2 diabetes mellitus with hyperglycemia: Secondary | ICD-10-CM

## 2018-12-14 ENCOUNTER — Other Ambulatory Visit: Payer: Self-pay | Admitting: Family Medicine

## 2018-12-14 DIAGNOSIS — E1165 Type 2 diabetes mellitus with hyperglycemia: Secondary | ICD-10-CM

## 2018-12-15 ENCOUNTER — Other Ambulatory Visit: Payer: Self-pay | Admitting: Nurse Practitioner

## 2018-12-15 DIAGNOSIS — E782 Mixed hyperlipidemia: Secondary | ICD-10-CM

## 2018-12-15 DIAGNOSIS — F32A Depression, unspecified: Secondary | ICD-10-CM

## 2018-12-15 DIAGNOSIS — F419 Anxiety disorder, unspecified: Secondary | ICD-10-CM

## 2018-12-15 DIAGNOSIS — F329 Major depressive disorder, single episode, unspecified: Secondary | ICD-10-CM

## 2018-12-17 DIAGNOSIS — Z23 Encounter for immunization: Secondary | ICD-10-CM | POA: Diagnosis not present

## 2018-12-24 ENCOUNTER — Other Ambulatory Visit: Payer: Self-pay | Admitting: Nurse Practitioner

## 2018-12-24 DIAGNOSIS — E1165 Type 2 diabetes mellitus with hyperglycemia: Secondary | ICD-10-CM

## 2019-01-08 ENCOUNTER — Other Ambulatory Visit: Payer: Self-pay | Admitting: Nurse Practitioner

## 2019-01-08 DIAGNOSIS — E1165 Type 2 diabetes mellitus with hyperglycemia: Secondary | ICD-10-CM

## 2019-01-31 ENCOUNTER — Other Ambulatory Visit: Payer: Self-pay | Admitting: Family Medicine

## 2019-01-31 DIAGNOSIS — E1165 Type 2 diabetes mellitus with hyperglycemia: Secondary | ICD-10-CM

## 2019-02-18 ENCOUNTER — Other Ambulatory Visit: Payer: Self-pay | Admitting: Nurse Practitioner

## 2019-02-18 DIAGNOSIS — R053 Chronic cough: Secondary | ICD-10-CM

## 2019-02-18 DIAGNOSIS — R05 Cough: Secondary | ICD-10-CM

## 2019-02-18 DIAGNOSIS — R0982 Postnasal drip: Secondary | ICD-10-CM

## 2019-03-25 ENCOUNTER — Other Ambulatory Visit: Payer: Self-pay | Admitting: Family Medicine

## 2019-03-25 DIAGNOSIS — E1165 Type 2 diabetes mellitus with hyperglycemia: Secondary | ICD-10-CM

## 2019-03-30 ENCOUNTER — Encounter: Payer: Self-pay | Admitting: *Deleted

## 2019-04-24 ENCOUNTER — Ambulatory Visit (INDEPENDENT_AMBULATORY_CARE_PROVIDER_SITE_OTHER): Payer: Medicare HMO | Admitting: Family Medicine

## 2019-04-24 ENCOUNTER — Encounter: Payer: Self-pay | Admitting: Family Medicine

## 2019-04-24 ENCOUNTER — Other Ambulatory Visit: Payer: Self-pay

## 2019-04-24 VITALS — BP 125/68 | HR 62 | Temp 97.3°F | Ht 65.0 in | Wt 183.8 lb

## 2019-04-24 DIAGNOSIS — E782 Mixed hyperlipidemia: Secondary | ICD-10-CM

## 2019-04-24 DIAGNOSIS — E669 Obesity, unspecified: Secondary | ICD-10-CM | POA: Diagnosis not present

## 2019-04-24 DIAGNOSIS — E1165 Type 2 diabetes mellitus with hyperglycemia: Secondary | ICD-10-CM | POA: Diagnosis not present

## 2019-04-24 DIAGNOSIS — M8580 Other specified disorders of bone density and structure, unspecified site: Secondary | ICD-10-CM | POA: Diagnosis not present

## 2019-04-24 DIAGNOSIS — F419 Anxiety disorder, unspecified: Secondary | ICD-10-CM | POA: Diagnosis not present

## 2019-04-24 DIAGNOSIS — E559 Vitamin D deficiency, unspecified: Secondary | ICD-10-CM

## 2019-04-24 DIAGNOSIS — F329 Major depressive disorder, single episode, unspecified: Secondary | ICD-10-CM | POA: Diagnosis not present

## 2019-04-24 DIAGNOSIS — Z1211 Encounter for screening for malignant neoplasm of colon: Secondary | ICD-10-CM | POA: Insufficient documentation

## 2019-04-24 DIAGNOSIS — R3121 Asymptomatic microscopic hematuria: Secondary | ICD-10-CM

## 2019-04-24 DIAGNOSIS — F32A Depression, unspecified: Secondary | ICD-10-CM

## 2019-04-24 LAB — POCT UA - MICROALBUMIN: Microalbumin Ur, POC: 20 mg/L

## 2019-04-24 LAB — POCT URINALYSIS DIPSTICK
Bilirubin, UA: NEGATIVE
Glucose, UA: NEGATIVE
Ketones, UA: NEGATIVE
Leukocytes, UA: NEGATIVE
Nitrite, UA: NEGATIVE
Protein, UA: NEGATIVE
Spec Grav, UA: 1.015 (ref 1.010–1.025)
Urobilinogen, UA: 0.2 E.U./dL
pH, UA: 7 (ref 5.0–8.0)

## 2019-04-24 LAB — POCT GLYCOSYLATED HEMOGLOBIN (HGB A1C): Hemoglobin A1C: 6.5 % — AB (ref 4.0–5.6)

## 2019-04-24 NOTE — Assessment & Plan Note (Signed)
Will continue taking Vitamin D supplement.  Will have labs drawn for Vitamin D level and call with results.

## 2019-04-24 NOTE — Assessment & Plan Note (Signed)
Will repeat DEXA next year, per patient request.  Aware DEXA from 04/2015 had shown Osteopenia.  Has continued to take Calcium and Vitamin D supplements along with daily exercise.  Continue taking supplements and daily exercise.

## 2019-04-24 NOTE — Patient Instructions (Addendum)
We will plan to see you back in clinic in 6 months.  I have written lab orders for you to complete within the next 2 weeks.  I have also written for a Cologuard test order.  Will call you when we receive the lab results.  Reminded to have yearly dilated eye exams, daily feet check, and following a heart healthy diet (low fat, low salt, low carb and protein control) along with daily exercise.  Eat a plant based diet for optimal health.  Advised to avoid juices, sodas, rice, pasta, potatoes and bread and to get at least 30 minutes of exercise 5x a week.  Other than as above, no change in treatment plans.  Patient expresses understanding and agreement with the care plan.  Any barriers were addressed. Questions were answered to the patient's satisfaction.  You will receive a survey after today's visit either digitally by e-mail or paper by [employer]. Your experiences and feedback matter to [place].  Please respond so we know how we are doing as we provide care for you.  Call us with any questions/concerns/needs.  It is my goal to be available to you for your health concerns.  Thanks for choosing me to be a partner in your healthcare needs!  Harlin Rain, FNP-C Family Nurse Practitioner [facility] Phone: [phone]

## 2019-04-24 NOTE — Progress Notes (Signed)
Subjective:    Patient ID: Maria Sloan, female    DOB: 1951/02/18, 68 y.o.   MRN: [id number]  Maria Sloan is a 69 y.o. female presenting on 04/24/2019 for follow up for diabetes, mixed dyslipidemia, vitamin D deficiency, and depression.  HPI  Health Maintenance: [name] had her influenza vaccination 01/2019 with her local pharmacy, is interested in obtaining a COVID vaccine and will contact [place] to schedule with a vaccination site.  Has had her diabetic dilated eye exam this year and performs foot exam nightly when she bathes.  Had her last Mammogram 04/2015 which was BI-RADS 1 and she is requesting to defer this until next year.  Denies any concerns for breast pain, discharge, lumps/bumps, skin thickening or breast changes.  Had her last DEXA 04/2015 which showed a T score of 1.4 being Osteopenia.  Has been taking a calcium supplement along with Vitamin D and has been getting regular exercise.  Requesting to repeat this next year when she has her mammogram done.  Has not had a colonoscopy yet and discussed her options of colonoscopy vs cologuard, without family history of colon cancer or polyps.  Discussed that there is always a risk for false negative and false positive results.  Verbalized understanding.  Interested in cologuard and will put in an order for this.  Depression screen [facility] 2/9 04/24/2019 08/16/2018 05/11/2018  Decreased Interest 0 0 0  Down, Depressed, Hopeless 1 1 0  PHQ - 2 Score 1 1 0  Altered sleeping 1 0 1  Tired, decreased energy 0 0 0  Change in appetite 0 0 0  Feeling bad or failure about yourself  0 1 0  Trouble concentrating 0 0 0  Moving slowly or fidgety/restless 0 0 0  Suicidal thoughts 0 0 0  PHQ-9 Score 2 2 1   Difficult doing work/chores Not difficult at all Not difficult at all Not difficult at all    Social History   Tobacco Use  . Smoking status: Former Smoker    Packs/day: 1.00    Years: 25.00    Pack years: 25.00    Types: Cigarettes   Quit date: 11/29/1994    Years since quitting: 24.4  . Smokeless tobacco: Never Used  Substance Use Topics  . Alcohol use: Never    Alcohol/week: 0.0 standard drinks  . Drug use: Never    Review of Systems  Constitutional: Negative.  Negative for fatigue, fever and unexpected weight change.  HENT: Negative.   Eyes: Negative.   Respiratory: Negative.   Cardiovascular: Negative.   Gastrointestinal: Positive for diarrhea. Negative for abdominal distention, abdominal pain, constipation, nausea and vomiting.  Endocrine: Negative.   Genitourinary: Negative.   Musculoskeletal: Negative.   Skin: Negative.   Neurological: Negative.   Hematological: Negative.   Psychiatric/Behavioral: Negative.    Diabetic Review of Systems - medication compliance: compliant all of the time, diabetic diet compliance: compliant all of the time, home glucose monitoring: is performed regularly, last eye exam within 1 year.  Per HPI unless specifically indicated above     Objective:    BP 125/68   Pulse 62   Temp (!) 97.3 F (36.3 C) (Oral)   Ht 5\' 5"  (1.651 m)   Wt 183 lb 12.8 oz (83.4 kg)   BMI 30.59 kg/m   Wt Readings from Last 3 Encounters:  04/24/19 183 lb 12.8 oz (83.4 kg)  05/26/18 172 lb 1.6 oz (78.1 kg)  05/19/18 173 lb 11.2 oz (78.8 kg)  Physical Exam Vitals reviewed.  Constitutional:      General: She is not in acute distress.    Appearance: Normal appearance. She is well-groomed. She is obese. She is not ill-appearing or toxic-appearing.  HENT:     Head: Normocephalic.  Eyes:     General: Lids are normal. Vision grossly intact.        Right eye: No discharge.        Left eye: No discharge.     Extraocular Movements: Extraocular movements intact.     Conjunctiva/sclera: Conjunctivae normal.     Pupils: Pupils are equal, round, and reactive to light.  Neck:     Thyroid: No thyroid mass or thyromegaly.  Cardiovascular:     Rate and Rhythm: Normal rate and regular rhythm.      Pulses: Normal pulses.          Dorsalis pedis pulses are 2+ on the right side and 2+ on the left side.       Posterior tibial pulses are 2+ on the right side and 2+ on the left side.     Heart sounds: Normal heart sounds. No murmur. No friction rub. No gallop.   Pulmonary:     Effort: Pulmonary effort is normal. No respiratory distress.     Breath sounds: Normal breath sounds. No wheezing.  Abdominal:     General: Abdomen is flat. Bowel sounds are normal. There is no distension.     Palpations: Abdomen is soft. There is no mass.     Tenderness: There is no abdominal tenderness. There is no guarding or rebound.     Hernia: No hernia is present.  Musculoskeletal:        General: Normal range of motion.     Cervical back: Normal range of motion.     Right lower leg: No edema.     Left lower leg: No edema.  Feet:     Right foot:     Skin integrity: Skin integrity normal. No ulcer, blister, skin breakdown, erythema, warmth, callus, dry skin or fissure.     Toenail Condition: Right toenails are normal.     Left foot:     Skin integrity: Skin integrity normal. No ulcer, blister, skin breakdown, erythema, warmth, callus, dry skin or fissure.     Toenail Condition: Left toenails are normal.     Comments: Normal Monofilament exam Skin:    General: Skin is warm and dry.     Capillary Refill: Capillary refill takes less than 2 seconds.  Neurological:     General: No focal deficit present.     Mental Status: She is alert and oriented to person, place, and time.     Cranial Nerves: Cranial nerves are intact. No cranial nerve deficit.     Sensory: Sensation is intact. No sensory deficit.     Motor: Motor function is intact. No weakness.     Coordination: Coordination is intact. Coordination normal.     Gait: Gait is intact. Gait normal.     Deep Tendon Reflexes: Reflexes normal.  Psychiatric:        Attention and Perception: Attention and perception normal.        Mood and Affect: Mood and  affect normal.        Speech: Speech normal.        Behavior: Behavior normal. Behavior is cooperative.        Thought Content: Thought content normal.        Cognition and  Memory: Cognition and memory normal.        Judgment: Judgment normal.    Results for orders placed or performed in visit on 04/24/19  POCT glycosylated hemoglobin (Hb A1C)  Result Value Ref Range   Hemoglobin A1C 6.5 (A) 4.0 - 5.6 %   HbA1c POC (<> result, manual entry)     HbA1c, POC (prediabetic range)     HbA1c, POC (controlled diabetic range)    POCT Urinalysis Dipstick  Result Value Ref Range   Color, UA yellow    Clarity, UA clear    Glucose, UA Negative Negative   Bilirubin, UA Negative    Ketones, UA Negative    Spec Grav, UA 1.015 1.010 - 1.025   Blood, UA trace    pH, UA 7.0 5.0 - 8.0   Protein, UA Negative Negative   Urobilinogen, UA 0.2 0.2 or 1.0 E.U./dL   Nitrite, UA negative    Leukocytes, UA Negative Negative   Appearance     Odor    POCT UA - Microalbumin  Result Value Ref Range   Microalbumin Ur, POC 20 mg/L   Creatinine, POC     Albumin/Creatinine Ratio, Urine, POC        Assessment & Plan:   Problem List Items Addressed This Visit      Endocrine   Type 2 diabetes mellitus with hyperglycemia (HCC) - Primary    Stable well controlled DM with A1C increasing from 6.1 to 6.5%.  No concerns for hypoglycemia. Complications - Sporadic hyperglycemia, hyperlipidemia, Depression, and 11lb weight gain within the past year.  Has been having diarrhea with taking Metformin.  Reviewed diet and is taking with breakfast and coffee.  Discussed having coffee 1-2 hours after Metformin to decrease the likelihood of diarrhea.  Plan: 1. Continue current medication therapy, Metformin 1000mg  PO BID and Glimepiride 2mg  daily. 2. Encouraged heart healthy diet with low glycemic index, continuing daily exercise and foot checks 3. Check blood glucose daily and bring log to next visit in 6 months. 4. Have  labs drawn within the next 2 weeks that were ordered today. 5. Follow up in 6 months for Diabetes follow up.       Relevant Orders   POCT glycosylated hemoglobin (Hb A1C) (Completed)   POCT Urinalysis Dipstick (Completed)   POCT UA - Microalbumin (Completed)   CBC with Differential   Comprehensive Metabolic Panel (CMET)     Musculoskeletal and Integument   Osteopenia    Will repeat DEXA next year, per patient request.  Aware DEXA from 04/2015 had shown Osteopenia.  Has continued to take Calcium and Vitamin D supplements along with daily exercise.  Continue taking supplements and daily exercise.        Other   Obesity (BMI 30-39.9) (Chronic)    Encouraged heart healthy low glycemic diet along with daily exercise.  Will recheck weight at next visit in 6 months.      Relevant Orders   Thyroid Panel With TSH   Anxiety and depression    Stable on Celexa.  Will continue and reassess in 6 months.      Mixed dyslipidemia    Will have labs drawn for lipid levels and call with results.  Continue to take Lipitor 20mg  as directed and be mindful of daily food cholesterol intake.  Continue daily exercise and heart healthy diet.      Relevant Orders   CBC with Differential   Comprehensive Metabolic Panel (CMET)   Lipid Profile  Vitamin D deficiency    Will continue taking Vitamin D supplement.  Will have labs drawn for Vitamin D level and call with results.      Relevant Orders   Vitamin D 1,25 dihydroxy    Other Visit Diagnoses    Asymptomatic microscopic hematuria       Relevant Orders   Urinalysis, Complete      No orders of the defined types were placed in this encounter.   Diabetes Mellitus: well controlled and stable  Follow up plan: Return in about 6 months (around 10/22/2019) for DM, A1C follow up.  Labs ordered at appointment today to be completed within the next 2 weeks.  Harlin Rain, [place] Family Nurse Practitioner [facility] 04/24/2019, 10:19 AM

## 2019-04-24 NOTE — Assessment & Plan Note (Signed)
Will have labs drawn for lipid levels and call with results.  Continue to take Lipitor 20mg  as directed and be mindful of daily food cholesterol intake.  Continue daily exercise and heart healthy diet.

## 2019-04-24 NOTE — Assessment & Plan Note (Signed)
Stable well controlled DM with A1C increasing from 6.1 to 6.5%.  No concerns for hypoglycemia. Complications - Sporadic hyperglycemia, hyperlipidemia, Depression, and 11lb weight gain within the past year.  Has been having diarrhea with taking Metformin.  Reviewed diet and is taking with breakfast and coffee.  Discussed having coffee 1-2 hours after Metformin to decrease the likelihood of diarrhea.  Plan: 1. Continue current medication therapy, Metformin 1000mg  PO BID and Glimepiride 2mg  daily. 2. Encouraged heart healthy diet with low glycemic index, continuing daily exercise and foot checks 3. Check blood glucose daily and bring log to next visit in 6 months. 4. Have labs drawn within the next 2 weeks that were ordered today. 5. Follow up in 6 months for Diabetes follow up.

## 2019-04-24 NOTE — Progress Notes (Signed)
I have reviewed this encounter including the documentation in this note and/or discussed this patient with the provider, Cyndia Skeeters FNP. I am certifying that I agree with the content of this note as supervising physician.  Nobie Putnam, [facility] 04/24/2019, 1:01 PM

## 2019-04-24 NOTE — Assessment & Plan Note (Signed)
Stable on Celexa.  Will continue and reassess in 6 months.

## 2019-04-24 NOTE — Assessment & Plan Note (Signed)
Encouraged heart healthy low glycemic diet along with daily exercise.  Will recheck weight at next visit in 6 months.

## 2019-04-25 LAB — URINALYSIS, COMPLETE
Bacteria, UA: NONE SEEN /HPF
Bilirubin Urine: NEGATIVE
Glucose, UA: NEGATIVE
Hgb urine dipstick: NEGATIVE
Hyaline Cast: NONE SEEN /LPF
Ketones, ur: NEGATIVE
Leukocytes,Ua: NEGATIVE
Nitrite: NEGATIVE
Protein, ur: NEGATIVE
RBC / HPF: NONE SEEN /HPF (ref 0–2)
Specific Gravity, Urine: 1.021 (ref 1.001–1.03)
Squamous Epithelial / HPF: NONE SEEN /HPF (ref <=5)
WBC, UA: NONE SEEN /HPF (ref 0–5)
pH: <=5 (ref 5.0–8.0)

## 2019-04-25 NOTE — Progress Notes (Signed)
Urinalysis reviewed and no acute findings.  Awaiting additional lab results for review.

## 2019-04-28 ENCOUNTER — Other Ambulatory Visit: Payer: Self-pay | Admitting: Nurse Practitioner

## 2019-04-28 DIAGNOSIS — R809 Proteinuria, unspecified: Secondary | ICD-10-CM

## 2019-04-28 DIAGNOSIS — E1129 Type 2 diabetes mellitus with other diabetic kidney complication: Secondary | ICD-10-CM

## 2019-04-28 DIAGNOSIS — E1165 Type 2 diabetes mellitus with hyperglycemia: Secondary | ICD-10-CM

## 2019-04-29 ENCOUNTER — Other Ambulatory Visit: Payer: Self-pay | Admitting: Nurse Practitioner

## 2019-04-29 DIAGNOSIS — E1165 Type 2 diabetes mellitus with hyperglycemia: Secondary | ICD-10-CM

## 2019-04-30 DIAGNOSIS — Z1211 Encounter for screening for malignant neoplasm of colon: Secondary | ICD-10-CM | POA: Diagnosis not present

## 2019-04-30 DIAGNOSIS — Z1212 Encounter for screening for malignant neoplasm of rectum: Secondary | ICD-10-CM | POA: Diagnosis not present

## 2019-04-30 LAB — COLOGUARD: Cologuard: NEGATIVE

## 2019-05-04 LAB — COLOGUARD: COLOGUARD: NEGATIVE

## 2019-05-11 ENCOUNTER — Other Ambulatory Visit: Payer: Medicare HMO

## 2019-05-11 DIAGNOSIS — E1165 Type 2 diabetes mellitus with hyperglycemia: Secondary | ICD-10-CM | POA: Diagnosis not present

## 2019-05-11 DIAGNOSIS — E669 Obesity, unspecified: Secondary | ICD-10-CM | POA: Diagnosis not present

## 2019-05-11 DIAGNOSIS — E559 Vitamin D deficiency, unspecified: Secondary | ICD-10-CM | POA: Diagnosis not present

## 2019-05-11 DIAGNOSIS — E782 Mixed hyperlipidemia: Secondary | ICD-10-CM | POA: Diagnosis not present

## 2019-05-12 ENCOUNTER — Other Ambulatory Visit: Payer: Self-pay | Admitting: Family Medicine

## 2019-05-12 ENCOUNTER — Ambulatory Visit: Payer: Self-pay | Attending: Internal Medicine

## 2019-05-12 DIAGNOSIS — Z23 Encounter for immunization: Secondary | ICD-10-CM

## 2019-05-12 DIAGNOSIS — E782 Mixed hyperlipidemia: Secondary | ICD-10-CM

## 2019-05-12 MED ORDER — EZETIMIBE 10 MG PO TABS: 10.0000 mg | ORAL_TABLET | Freq: Every day | ORAL | 1 refills | Status: DC

## 2019-05-12 NOTE — Progress Notes (Signed)
Sent in refill on Zetia.  Trigylcerides remain elevated.  Patient reported at office visit 04/24/2019 was no longer taking medication.  Will contact to discuss.

## 2019-05-12 NOTE — Progress Notes (Signed)
Triglycerides remain elevated.  At her last office visit it was noted she had stopped the Zetia.  In the past it appears when she was taking the Zetia it was lower her triglycerides over time.  It is important that we keep her triglycerides as close to normal as possible as in the past they were very elevated.  I have sent in a refill on her Zetia to restart unless she was having any side effects and then we can look at [facility].  The rest of her labs look good.

## 2019-05-12 NOTE — Progress Notes (Signed)
   Covid-19 Vaccination Clinic  Name:  Maria Sloan    MRN: [id number] DOB: May 09, 1950  05/12/2019  [name] was observed post Covid-19 immunization for 15 minutes without incident. She was provided with Vaccine Information Sheet and instruction to access the V-Safe system.   [name] was instructed to call 911 with any severe reactions post vaccine: Marland Kitchen Difficulty breathing  . Swelling of face and throat  . A fast heartbeat  . A bad rash all over body  . Dizziness and weakness   Immunizations Administered    Name Date Dose VIS Date Route   Pfizer COVID-19 Vaccine 05/12/2019 11:23 AM 0.3 mL 02/10/2019 Intramuscular   Manufacturer: [place]   Lot: [id number]   [place]: [id number]

## 2019-05-16 LAB — CBC WITH DIFFERENTIAL/PLATELET
Absolute Monocytes: 562 cells/uL (ref 200–950)
Basophils Absolute: 53 cells/uL (ref 0–200)
Basophils Relative: 0.7 %
Eosinophils Absolute: 304 cells/uL (ref 15–500)
Eosinophils Relative: 4 %
HCT: 40.6 % (ref 35.0–45.0)
Hemoglobin: 13.8 g/dL (ref 11.7–15.5)
Lymphs Abs: 2341 cells/uL (ref 850–3900)
MCH: 30.5 pg (ref 27.0–33.0)
MCHC: 34 g/dL (ref 32.0–36.0)
MCV: 89.6 fL (ref 80.0–100.0)
MPV: 11.5 fL (ref 7.5–12.5)
Monocytes Relative: 7.4 %
Neutro Abs: 4340 cells/uL (ref 1500–7800)
Neutrophils Relative %: 57.1 %
Platelets: 270 10*3/uL (ref 140–400)
RBC: 4.53 10*6/uL (ref 3.80–5.10)
RDW: 12.9 % (ref 11.0–15.0)
Total Lymphocyte: 30.8 %
WBC: 7.6 10*3/uL (ref 3.8–10.8)

## 2019-05-16 LAB — URINALYSIS, MICROSCOPIC ONLY
Bacteria, UA: NONE SEEN /HPF
Hyaline Cast: NONE SEEN /LPF
RBC / HPF: NONE SEEN /HPF (ref 0–2)
Squamous Epithelial / HPF: NONE SEEN /HPF (ref <=5)

## 2019-05-16 LAB — VITAMIN D 1,25 DIHYDROXY
Vitamin D 1, 25 (OH)2 Total: 33 pg/mL (ref 18–72)
Vitamin D2 1, 25 (OH)2: <8 pg/mL
Vitamin D3 1, 25 (OH)2: 33 pg/mL

## 2019-05-16 LAB — COMPREHENSIVE METABOLIC PANEL
AG Ratio: 1.7 (calc) (ref 1.0–2.5)
ALT: 20 U/L (ref 6–29)
AST: 18 U/L (ref 10–35)
Albumin: 4.4 g/dL (ref 3.6–5.1)
Alkaline phosphatase (APISO): 56 U/L (ref 37–153)
BUN: 21 mg/dL (ref 7–25)
CO2: 27 mmol/L (ref 20–32)
Calcium: 9.6 mg/dL (ref 8.6–10.4)
Chloride: 102 mmol/L (ref 98–110)
Creat: 0.72 mg/dL (ref 0.50–0.99)
Globulin: 2.6 g/dL (calc) (ref 1.9–3.7)
Glucose, Bld: 145 mg/dL — ABNORMAL HIGH (ref 65–99)
Potassium: 4.7 mmol/L (ref 3.5–5.3)
Sodium: 138 mmol/L (ref 135–146)
Total Bilirubin: 0.5 mg/dL (ref 0.2–1.2)
Total Protein: 7 g/dL (ref 6.1–8.1)

## 2019-05-16 LAB — LIPID PANEL
Cholesterol: 151 mg/dL (ref <200)
HDL: 37 mg/dL — ABNORMAL LOW (ref >=50)
LDL Cholesterol (Calc): 82 mg/dL (calc)
Non-HDL Cholesterol (Calc): 114 mg/dL (calc) (ref <130)
Total CHOL/HDL Ratio: 4.1 (calc) (ref <5.0)
Triglycerides: 228 mg/dL — ABNORMAL HIGH (ref <150)

## 2019-05-16 LAB — THYROID PANEL WITH TSH
Free Thyroxine Index: 2.1 (ref 1.4–3.8)
T3 Uptake: 28 % (ref 22–35)
T4, Total: 7.6 ug/dL (ref 5.1–11.9)
TSH: 0.85 mIU/L (ref 0.40–4.50)

## 2019-05-21 ENCOUNTER — Other Ambulatory Visit: Payer: Self-pay | Admitting: Family Medicine

## 2019-05-21 DIAGNOSIS — R053 Chronic cough: Secondary | ICD-10-CM

## 2019-05-21 DIAGNOSIS — R05 Cough: Secondary | ICD-10-CM

## 2019-05-21 DIAGNOSIS — R0982 Postnasal drip: Secondary | ICD-10-CM

## 2019-05-23 ENCOUNTER — Ambulatory Visit (INDEPENDENT_AMBULATORY_CARE_PROVIDER_SITE_OTHER): Payer: Medicare HMO

## 2019-05-23 VITALS — Ht 65.0 in | Wt 183.0 lb

## 2019-05-23 DIAGNOSIS — Z1231 Encounter for screening mammogram for malignant neoplasm of breast: Secondary | ICD-10-CM

## 2019-05-23 DIAGNOSIS — Z0000 Encounter for general adult medical examination without abnormal findings: Secondary | ICD-10-CM

## 2019-05-23 NOTE — Patient Instructions (Signed)
Maria Sloan , Thank you for taking time to come for your Medicare Wellness Visit. I appreciate your ongoing commitment to your health goals. Please review the following plan we discussed and let me know if I can assist you in the future.   Screening recommendations/referrals: Colonoscopy: cologuard completed 04/2019  Mammogram: Please call [phone] to schedule your mammogram.  Bone Density: up to date  Recommended yearly ophthalmology/optometry visit for glaucoma screening and checkup Recommended yearly dental visit for hygiene and checkup  Vaccinations: Influenza vaccine: up to date  Pneumococcal vaccine: pneumoccocal 23 due  Tdap vaccine: up to date  Shingles vaccine: shingrix eligible    Covid-19: first dose completed, second dose scheduled   Advanced directives: Advance directive discussed with you today. Please pick up a copy of this information next time you are in the office.  Once this is complete please bring a copy in to our office so we can scan it into your chart.  Conditions/risks identified: Diabetic, discussed chronic care management team.   Next appointment: Follow up in one year for your annual wellness visit.    Preventive Care 69 Years and Older, Female Preventive care refers to lifestyle choices and visits with your health care provider that can promote health and wellness. What does preventive care include?  A yearly physical exam. This is also called an annual well check.  Dental exams once or twice a year.  Routine eye exams. Ask your health care provider how often you should have your eyes checked.  Personal lifestyle choices, including:  Daily care of your teeth and gums.  Regular physical activity.  Eating a healthy diet.  Avoiding tobacco and drug use.  Limiting alcohol use.  Practicing safe sex.  Taking low-dose aspirin every day.  Taking vitamin and mineral supplements as recommended by your health care provider. What happens during an  annual well check? The services and screenings done by your health care provider during your annual well check will depend on your age, overall health, lifestyle risk factors, and family history of disease. Counseling  Your health care provider may ask you questions about your:  Alcohol use.  Tobacco use.  Drug use.  Emotional well-being.  Home and relationship well-being.  Sexual activity.  Eating habits.  History of falls.  Memory and ability to understand (cognition).  Work and work Statistician.  Reproductive health. Screening  You may have the following tests or measurements:  Height, weight, and BMI.  Blood pressure.  Lipid and cholesterol levels. These may be checked every 5 years, or more frequently if you are over 14 years old.  Skin check.  Lung cancer screening. You may have this screening every year starting at age 60 if you have a 30-pack-year history of smoking and currently smoke or have quit within the past 15 years.  Fecal occult blood test (FOBT) of the stool. You may have this test every year starting at age 59.  Flexible sigmoidoscopy or colonoscopy. You may have a sigmoidoscopy every 5 years or a colonoscopy every 10 years starting at age 31.  Hepatitis C blood test.  Hepatitis B blood test.  Sexually transmitted disease (STD) testing.  Diabetes screening. This is done by checking your blood sugar (glucose) after you have not eaten for a while (fasting). You may have this done every 1-3 years.  Bone density scan. This is done to screen for osteoporosis. You may have this done starting at age 15.  Mammogram. This may be done every 1-2 years.  Talk to your health care provider about how often you should have regular mammograms. Talk with your health care provider about your test results, treatment options, and if necessary, the need for more tests. Vaccines  Your health care provider may recommend certain vaccines, such as:  Influenza  vaccine. This is recommended every year.  Tetanus, diphtheria, and acellular pertussis (Tdap, Td) vaccine. You may need a Td booster every 10 years.  Zoster vaccine. You may need this after age 21.  Pneumococcal 13-valent conjugate (PCV13) vaccine. One dose is recommended after age 104.  Pneumococcal polysaccharide (PPSV23) vaccine. One dose is recommended after age 56. Talk to your health care provider about which screenings and vaccines you need and how often you need them. This information is not intended to replace advice given to you by your health care provider. Make sure you discuss any questions you have with your health care provider. Document Released: 03/15/2015 Document Revised: 11/06/2015 Document Reviewed: 12/18/2014 Elsevier Interactive Patient Education  2017 [place] Prevention in the Home Falls can cause injuries. They can happen to people of all ages. There are many things you can do to make your home safe and to help prevent falls. What can I do on the outside of my home?  Regularly fix the edges of walkways and driveways and fix any cracks.  Remove anything that might make you trip as you walk through a door, such as a raised step or threshold.  Trim any bushes or trees on the path to your home.  Use bright outdoor lighting.  Clear any walking paths of anything that might make someone trip, such as rocks or tools.  Regularly check to see if handrails are loose or broken. Make sure that both sides of any steps have handrails.  Any raised decks and porches should have guardrails on the edges.  Have any leaves, snow, or ice cleared regularly.  Use sand or salt on walking paths during winter.  Clean up any spills in your garage right away. This includes oil or grease spills. What can I do in the bathroom?  Use night lights.  Install grab bars by the toilet and in the tub and shower. Do not use towel bars as grab bars.  Use non-skid mats or decals  in the tub or shower.  If you need to sit down in the shower, use a plastic, non-slip stool.  Keep the floor dry. Clean up any water that spills on the floor as soon as it happens.  Remove soap buildup in the tub or shower regularly.  Attach bath mats securely with double-sided non-slip rug tape.  Do not have throw rugs and other things on the floor that can make you trip. What can I do in the bedroom?  Use night lights.  Make sure that you have a light by your bed that is easy to reach.  Do not use any sheets or blankets that are too big for your bed. They should not hang down onto the floor.  Have a firm chair that has side arms. You can use this for support while you get dressed.  Do not have throw rugs and other things on the floor that can make you trip. What can I do in the kitchen?  Clean up any spills right away.  Avoid walking on wet floors.  Keep items that you use a lot in easy-to-reach places.  If you need to reach something above you, use a strong step stool  that has a grab bar.  Keep electrical cords out of the way.  Do not use floor polish or wax that makes floors slippery. If you must use wax, use non-skid floor wax.  Do not have throw rugs and other things on the floor that can make you trip. What can I do with my stairs?  Do not leave any items on the stairs.  Make sure that there are handrails on both sides of the stairs and use them. Fix handrails that are broken or loose. Make sure that handrails are as long as the stairways.  Check any carpeting to make sure that it is firmly attached to the stairs. Fix any carpet that is loose or worn.  Avoid having throw rugs at the top or bottom of the stairs. If you do have throw rugs, attach them to the floor with carpet tape.  Make sure that you have a light switch at the top of the stairs and the bottom of the stairs. If you do not have them, ask someone to add them for you. What else can I do to help  prevent falls?  Wear shoes that:  Do not have high heels.  Have rubber bottoms.  Are comfortable and fit you well.  Are closed at the toe. Do not wear sandals.  If you use a stepladder:  Make sure that it is fully opened. Do not climb a closed stepladder.  Make sure that both sides of the stepladder are locked into place.  Ask someone to hold it for you, if possible.  Clearly mark and make sure that you can see:  Any grab bars or handrails.  First and last steps.  Where the edge of each step is.  Use tools that help you move around (mobility aids) if they are needed. These include:  Canes.  Walkers.  Scooters.  Crutches.  Turn on the lights when you go into a dark area. Replace any light bulbs as soon as they burn out.  Set up your furniture so you have a clear path. Avoid moving your furniture around.  If any of your floors are uneven, fix them.  If there are any pets around you, be aware of where they are.  Review your medicines with your doctor. Some medicines can make you feel dizzy. This can increase your chance of falling. Ask your doctor what other things that you can do to help prevent falls. This information is not intended to replace advice given to you by your health care provider. Make sure you discuss any questions you have with your health care provider. Document Released: 12/13/2008 Document Revised: 07/25/2015 Document Reviewed: 03/23/2014 Elsevier Interactive Patient Education  2017 [employer].

## 2019-05-23 NOTE — Progress Notes (Signed)
Subjective:   Maria Sloan is a 69 y.o. female who presents for an Initial Medicare Annual Wellness Visit.  This visit is being conducted via phone call  - after an attmept to do on video chat - due to the COVID-19 pandemic. This patient has given me verbal consent via phone to conduct this visit, patient states they are participating from their home address. Some vital signs may be absent or patient reported.   Patient identification: identified by name, DOB, and current address.    Review of Systems      Cardiac Risk Factors include: advanced age (>67mn, >>30women)     Objective:    Today's Vitals   05/23/19 0918  Weight: 183 lb (83 kg)  Height: '5\' 5"'  (1.651 m)   Body mass index is 30.45 kg/m.  Advanced Directives 05/23/2019 04/18/2018 02/07/2015  Does Patient Have a Medical Advance Directive? No - No  Would patient like information on creating a medical advance directive? - No - Patient declined -    Current Medications (verified) Outpatient Encounter Medications as of 05/23/2019  Medication Sig  . ACCU-CHEK AVIVA PLUS test strip USE UP TO ONE TIME DAILY AS DIRECTED. FOR ICD-10 E10.9, E11.9 A1C 02/11/2018- 11.8%  . atorvastatin (LIPITOR) 20 MG tablet TAKE 1 TABLET BY MOUTH EVERY DAY  . Blood Glucose Monitoring Suppl (ACCU-CHEK AVIVA PLUS) w/Device KIT 1 Device by Does not apply route as directed. . One time daily as directed. FOR ICD-10 E10.9, E11.9 A1C 02/11/2018- 11.8%  . CALCIUM CITRATE PO Take 1 tablet by mouth daily.   . Cholecalciferol (VITAMIN D PO) Take 1 tablet by mouth daily.   . citalopram (CELEXA) 20 MG tablet TAKE 1 TABLET BY MOUTH EVERY DAY  . ezetimibe (ZETIA) 10 MG tablet Take 1 tablet (10 mg total) by mouth daily.  . fluticasone (FLONASE) 50 MCG/ACT nasal spray PLACE 2 SPRAYS INTO BOTH NOSTRILS DAILY. USE FOR 4-6 WEEKS THEN STOP AND USE AS NEEDED  . glimepiride (AMARYL) 2 MG tablet TAKE 1 TABLET BY MOUTH EVERY DAY WITH BREAKFAST  . Lancets (ACCU-CHEK SOFT  TOUCH) lancets Use TWO times daily as directed. FOR ICD-10 E10.9, E11.9 A1C 02/11/2018- 11.8%  . lisinopril (ZESTRIL) 5 MG tablet TAKE 1 TABLET BY MOUTH EVERY DAY  . metFORMIN (GLUCOPHAGE) 1000 MG tablet TAKE 1 TABLET BY MOUTH TWICE A DAY WITH MEALS  . Multiple Vitamin (MULTIVITAMIN) tablet Take 1 tablet by mouth daily.  . Omega-3 Fatty Acids (FISH OIL) 1000 MG CAPS Take 1,000 mg by mouth 3 (three) times daily.  . Potassium 75 MG TABS Take 1 tablet by mouth daily.   No facility-administered encounter medications on file as of 05/23/2019.    Allergies (verified) Actifed cold-allergy [chlorpheniramine-phenylephrine], Compazine [prochlorperazine], and Penicillins   History: Past Medical History:  Diagnosis Date  . Anxiety   . Arthritis   . [place]   . Hyperlipidemia   . PVD (peripheral vascular disease) ([facility])    R leg venous insufficiency  . [place] disease) [place]   . Shingles   . Type 2 diabetes mellitus [facility]    Past Surgical History:  Procedure Laterality Date  . PARTIAL HYSTERECTOMY     ovaries remain  . ROTATOR CUFF REPAIR Right    Family History  Problem Relation Age of Onset  . Emphysema Father   . Lung cancer Father   . Hyperlipidemia Father   . Stroke Mother   . Hyperlipidemia Sister   . Healthy Son  Social History   Socioeconomic History  . Marital status: Divorced    Spouse name: Not on file  . Number of children: Not on file  . Years of education: Not on file  . Highest education level: Bachelor's degree (e.g., BA, AB, BS)  Occupational History  . Occupation: retired   Tobacco Use  . Smoking status: Former Smoker    Packs/day: 1.00    Years: 25.00    Pack years: 25.00    Types: Cigarettes    Quit date: 11/29/1994    Years since quitting: 24.4  . Smokeless tobacco: Never Used  Substance and Sexual Activity  . Alcohol use: Never    Alcohol/week: 0.0 standard drinks  . Drug use: Never  . Sexual activity: Not  Currently    Birth control/protection: None  Other Topics Concern  . Not on file  Social History Narrative   Divorced.   1 son.    Moved from [place].   Enjoys making jewelry, spending time with family, crafts, reading.   Social Determinants of Health   Financial Resource Strain:   . Difficulty of Paying Living Expenses:   Food Insecurity:   . Worried About Charity fundraiser in the Last Year:   . Arboriculturist in the Last Year:   Transportation Needs:   . Film/video editor (Medical):   Marland Kitchen Lack of Transportation (Non-Medical):   Physical Activity:   . Days of Exercise per Week:   . Minutes of Exercise per Session:   Stress:   . Feeling of Stress :   Social Connections:   . Frequency of Communication with Friends and Family:   . Frequency of Social Gatherings with Friends and Family:   . Attends Religious Services:   . Active Member of Clubs or Organizations:   . Attends Archivist Meetings:   Marland Kitchen Marital Status:     Tobacco Counseling Counseling given: Not Answered   Clinical Intake:  Pre-visit preparation completed: Yes  Pain : No/denies pain     Nutritional Status: BMI > 30  Obese Nutritional Risks: None Diabetes: No  How often do you need to have someone help you when you read instructions, pamphlets, or other written materials from your doctor or pharmacy?: 1 - Never  Interpreter Needed?: No  Information entered by :: [place]   Activities of Daily Living In your present state of health, do you have any difficulty performing the following activities: 05/23/2019 04/24/2019  Hearing? Y Y  Comment hearing aids -  Vision? Y Y  Comment eyeglasses, [facility] -  Difficulty concentrating or making decisions? N N  Walking or climbing stairs? N N  Dressing or bathing? N N  Doing errands, shopping? N N  Preparing Food and eating ? N -  Using the Toilet? N -  In the past six months, have you accidently leaked urine? N -  Do  you have problems with loss of bowel control? N -  Managing your Medications? N -  Managing your Finances? N -  Housekeeping or managing your Housekeeping? N -  Some recent data might be hidden     Immunizations and Health Maintenance Immunization History  Administered Date(s) Administered  . Influenza, High Dose Seasonal PF 12/17/2018  . Influenza,inj,Quad PF,6+ Mos 12/13/2015  . Influenza-Unspecified 12/10/2014, 01/07/2018, 01/30/2019  . PFIZER SARS-COV-2 Vaccination 05/12/2019  . Pneumococcal Conjugate-13 01/07/2018  . Tdap 04/05/2015   Health Maintenance Due  Topic Date Due  . MAMMOGRAM  04/23/2017  . PNA vac Low Risk Adult (2 of 2 - PPSV23) 01/08/2019  . FOOT EXAM  05/11/2019    Patient Care Team: Verl Bangs, FNP as PCP - General (Family Medicine)  Indicate any recent Medical Services you may have received from other than [facility] providers in the past year (date may be approximate).     Assessment:   This is a routine wellness examination for Maria Sloan.  Hearing/Vision screen  Hearing Screening   '125Hz'  '250Hz'  '500Hz'  '1000Hz'  '2000Hz'  '3000Hz'  '4000Hz'  '6000Hz'  '8000Hz'   Right ear:           Left ear:           Vision Screening Comments: [facility]  Dietary issues and exercise activities discussed: Current Exercise Habits: The patient does not participate in regular exercise at present, Exercise limited by: None identified  Goals Addressed   None    Depression Screen PHQ 2/9 Scores 05/23/2019 04/24/2019 08/16/2018 05/11/2018 04/18/2018 12/30/2017  PHQ - 2 Score '1 1 1 ' 0 0 2  PHQ- 9 Score '2 2 2 1 ' - 3    Fall Risk Fall Risk  05/23/2019 08/16/2018 05/26/2018 05/19/2018 05/12/2018  Falls in the past year? 0 0 (No Data) 1 (No Data)  Comment - - no falls since previous visit - no falls since previous visit  Number falls in past yr: 0 - - 0 -  Injury with Fall? 0 - - 0 -  Follow up - Falls evaluation completed - - -   FALL RISK PREVENTION PERTAINING TO THE HOME:  Any stairs  in or around the home? Yes  If so, are there any without handrails? No   Home free of loose throw rugs in walkways, pet beds, electrical cords, etc? Yes  Adequate lighting in your home to reduce risk of falls? Yes   ASSISTIVE DEVICES UTILIZED TO PREVENT FALLS:  Life alert? No  Use of a cane, walker or w/c? No  Grab bars in the bathroom? No  Shower chair or bench in shower? no Elevated toilet seat or a handicapped toilet? No    DME ORDERS:  DME order needed?  No   TIMED UP AND GO:  Unable to perform     Cognitive Function:        Screening Tests Health Maintenance  Topic Date Due  . MAMMOGRAM  04/23/2017  . PNA vac Low Risk Adult (2 of 2 - PPSV23) 01/08/2019  . FOOT EXAM  05/11/2019  . HEMOGLOBIN A1C  10/22/2019  . OPHTHALMOLOGY EXAM  11/10/2019  . Fecal DNA (Cologuard)  04/29/2022  . TETANUS/TDAP  04/04/2025  . INFLUENZA VACCINE  Completed  . DEXA SCAN  Completed  . Hepatitis C Screening  Completed    Qualifies for Shingles Vaccine? Yes  Zostavax completed n/a. Due for Shingrix. Education has been provided regarding the importance of this vaccine. Pt has been advised to call insurance company to determine out of pocket expense. Advised may also receive vaccine at local pharmacy or Health Dept. Verbalized acceptance and understanding.  Tdap: up to date   Flu Vaccine: up to date  Pneumococcal Vaccine: Due for Pneumococcal vaccine. Will get at next office visit.   Covid-19 Vaccine: first dose completed, second dose scheduled  Cancer Screenings:  Colorectal Screening: Completed cologuard 04/30/2019. Repeat every 3 years  Mammogram: Completed 2017, ordered   Bone Density: Completed 04/29/2015.   Lung Cancer Screening: (Low Dose CT Chest recommended if Age 19-80 years, 30 pack-year currently smoking OR  have quit w/in 15years.) does not qualify.   Additional Screening:  Hepatitis C Screening: does qualify; Completed 2017  Vision Screening: Recommended annual  ophthalmology exams for early detection of glaucoma and other disorders of the eye. Is the patient up to date with their annual eye exam?  Yes  Who is the provider or what is the name of the office in which the pt attends annual eye exams? [facility]    Dental Screening: Recommended annual dental exams for proper oral hygiene  Community Resource Referral:  CRR required this visit?  No       Plan:  I have personally reviewed and addressed the Medicare Annual Wellness questionnaire and have noted the following in the patient's chart:  A. Medical and social history B. Use of alcohol, tobacco or illicit drugs  C. Current medications and supplements D. Functional ability and status E.  Nutritional status F.  Physical activity G. Advance directives H. List of other physicians I.  Hospitalizations, surgeries, and ER visits in previous 12 months J.  [place] such as hearing and vision if needed, cognitive and depression L. Referrals and appointments   In addition, I have reviewed and discussed with patient certain preventive protocols, quality metrics, and best practice recommendations. A written personalized care plan for preventive services as well as general preventive health recommendations were provided to patient.   Signed,    Bevelyn Ngo, LPN   0/24/0973  Nurse Health Advisor   Nurse Notes: none

## 2019-06-05 ENCOUNTER — Ambulatory Visit: Payer: Self-pay

## 2019-06-08 ENCOUNTER — Other Ambulatory Visit: Payer: Self-pay | Admitting: Nurse Practitioner

## 2019-06-08 DIAGNOSIS — E782 Mixed hyperlipidemia: Secondary | ICD-10-CM

## 2019-06-11 ENCOUNTER — Other Ambulatory Visit: Payer: Self-pay | Admitting: Nurse Practitioner

## 2019-06-11 DIAGNOSIS — F329 Major depressive disorder, single episode, unspecified: Secondary | ICD-10-CM

## 2019-06-11 DIAGNOSIS — F32A Depression, unspecified: Secondary | ICD-10-CM

## 2019-06-12 ENCOUNTER — Ambulatory Visit: Payer: Self-pay | Attending: Internal Medicine

## 2019-06-12 DIAGNOSIS — Z23 Encounter for immunization: Secondary | ICD-10-CM

## 2019-06-12 NOTE — Progress Notes (Signed)
   Covid-19 Vaccination Clinic  Name:  Krystena Thilges    MRN: [id number] DOB: [birth year]  06/12/2019  Ms. Farina was observed post Covid-19 immunization for 15 minutes without incident. She was provided with Vaccine Information Sheet and instruction to access the V-Safe system.   Ms. Scharrer was instructed to call 911 with any severe reactions post vaccine: Marland Kitchen Difficulty breathing  . Swelling of face and throat  . A fast heartbeat  . A bad rash all over body  . Dizziness and weakness   Immunizations Administered    Name Date Dose VIS Date Route   Pfizer COVID-19 Vaccine 06/12/2019  8:44 AM 0.3 mL 02/10/2019 Intramuscular   Manufacturer: [place]   Lot: [id number]   [place]: [id number]

## 2019-06-14 NOTE — Telephone Encounter (Signed)
Per visit 2/22- continue medication- patient has appointment in August for follow up.

## 2019-07-19 ENCOUNTER — Ambulatory Visit (INDEPENDENT_AMBULATORY_CARE_PROVIDER_SITE_OTHER): Payer: Medicare HMO | Admitting: Family Medicine

## 2019-07-19 ENCOUNTER — Other Ambulatory Visit: Payer: Self-pay

## 2019-07-19 ENCOUNTER — Encounter: Payer: Self-pay | Admitting: Family Medicine

## 2019-07-19 VITALS — BP 118/60 | HR 63 | Temp 97.5°F | Ht 65.0 in | Wt 180.4 lb

## 2019-07-19 DIAGNOSIS — M25552 Pain in left hip: Secondary | ICD-10-CM

## 2019-07-19 DIAGNOSIS — F329 Major depressive disorder, single episode, unspecified: Secondary | ICD-10-CM

## 2019-07-19 DIAGNOSIS — F32A Depression, unspecified: Secondary | ICD-10-CM

## 2019-07-19 DIAGNOSIS — F419 Anxiety disorder, unspecified: Secondary | ICD-10-CM | POA: Diagnosis not present

## 2019-07-19 MED ORDER — DICLOFENAC SODIUM 1 % EX GEL: 2.0000 g | Freq: Four times a day (QID) | CUTANEOUS | 1 refills | Status: DC

## 2019-07-19 MED ORDER — BUSPIRONE HCL 5 MG PO TABS: 5.0000 mg | ORAL_TABLET | Freq: Three times a day (TID) | ORAL | 1 refills | Status: DC

## 2019-07-19 MED ORDER — CITALOPRAM HYDROBROMIDE 20 MG PO TABS: 20.0000 mg | ORAL_TABLET | Freq: Every day | ORAL | 1 refills | Status: DC

## 2019-07-19 MED ORDER — NAPROXEN 500 MG PO TABS: 500.0000 mg | ORAL_TABLET | Freq: Two times a day (BID) | ORAL | 0 refills | Status: DC

## 2019-07-19 NOTE — Progress Notes (Signed)
Subjective:    Patient ID: Maria Sloan, female    DOB: Jul 11, 1950, 69 y.o.   MRN: [id number]  Maria Sloan is a 69 y.o. female presenting on 07/19/2019 for Hip Pain (constant aching pain in left hip and groin area , with difficulty walking x 5 weeks. She doesn't recall injuring the hip. The pain doesn't radiate, but notice certain movement worsen the pai. ) and Anxiety (feeling sad w/ lack of desire to do anything)   HPI  [name] presents to clinic with c/o hip pain that is chronic, aching pain in the left hip, has noticed some difficulty with moving and internal hip rotation.  Reports pain has been present for approximately 5 weeks, denies any traumatic injury/fall/twisting and states symptoms are intermittent and position dependent.  Denies numbness, tingling, weakness, change in bowel/bladder function, saddle anesthesia, sleep disturbance.  Has been having increased anxiety recently.  Reports is taking her celexa 20mg  daily and has a history of anxiety attacks in the past that have been well managed.  Reports in the past were managed with alprazolam but has not been taking that medication for >2 years.  Finds that she has difficulty getting out of her anxiety attacks and is requesting medication for assistance with managing these.  Denies SI/HI.  Depression screen [facility] 2/9 05/23/2019 04/24/2019 08/16/2018  Decreased Interest 0 0 0  Down, Depressed, Hopeless 1 1 1   PHQ - 2 Score 1 1 1   Altered sleeping 1 1 0  Tired, decreased energy 0 0 0  Change in appetite 0 0 0  Feeling bad or failure about yourself  0 0 1  Trouble concentrating 0 0 0  Moving slowly or fidgety/restless 0 0 0  Suicidal thoughts 0 0 0  PHQ-9 Score 2 2 2   Difficult doing work/chores Somewhat difficult Not difficult at all Not difficult at all    Social History   Tobacco Use  . Smoking status: Former Smoker    Packs/day: 1.00    Years: 25.00    Pack years: 25.00    Types: Cigarettes    Quit date: 11/29/1994   Years since quitting: 24.6  . Smokeless tobacco: Never Used  Substance Use Topics  . Alcohol use: Never    Alcohol/week: 0.0 standard drinks  . Drug use: Never    Review of Systems  Constitutional: Negative.   HENT: Negative.   Eyes: Negative.   Respiratory: Negative.   Cardiovascular: Negative.   Gastrointestinal: Negative.   Endocrine: Negative.   Genitourinary: Negative.   Musculoskeletal: Positive for arthralgias and gait problem. Negative for back pain, joint swelling, myalgias, neck pain and neck stiffness.  Skin: Negative.   Allergic/Immunologic: Negative.   Hematological: Negative.   Psychiatric/Behavioral: Positive for dysphoric mood. Negative for agitation, behavioral problems, confusion, decreased concentration, hallucinations, self-injury, sleep disturbance and suicidal ideas. The patient is nervous/anxious. The patient is not hyperactive.    Per HPI unless specifically indicated above     Objective:    BP 118/60   Pulse 63   Temp (!) 97.5 F (36.4 C) (Temporal)   Ht 5\' 5"  (1.651 m)   Wt 180 lb 6.4 oz (81.8 kg)   BMI 30.02 kg/m   Wt Readings from Last 3 Encounters:  07/19/19 180 lb 6.4 oz (81.8 kg)  05/23/19 183 lb (83 kg)  04/24/19 183 lb 12.8 oz (83.4 kg)    Physical Exam Vitals reviewed.  Constitutional:      General: She is not in acute distress.  Appearance: Normal appearance. She is obese. She is not ill-appearing or toxic-appearing.  HENT:     Head: Normocephalic.     Nose:     Comments: Maria Sloan is in place, covering mouth and nose  Eyes:     General: Lids are normal. Vision grossly intact.        Right eye: No discharge.        Left eye: No discharge.     Extraocular Movements: Extraocular movements intact.     Conjunctiva/sclera: Conjunctivae normal.     Pupils: Pupils are equal, round, and reactive to light.  Cardiovascular:     Rate and Rhythm: Normal rate and regular rhythm.     Pulses: Normal pulses.          Dorsalis pedis  pulses are 2+ on the right side and 2+ on the left side.     Heart sounds: Normal heart sounds. No murmur. No friction rub. No gallop.   Pulmonary:     Effort: Pulmonary effort is normal. No respiratory distress.     Breath sounds: Normal breath sounds.  Musculoskeletal:        General: Tenderness present. Normal range of motion.     Right hip: Normal.     Left hip: Tenderness present. Normal range of motion. Normal strength.     Right lower leg: No edema.     Left lower leg: No edema.       Legs:     Comments: Tenderness with internal rotation and log rolling leg left hip  Feet:     Right foot:     Skin integrity: Skin integrity normal.     Left foot:     Skin integrity: Skin integrity normal.  Skin:    General: Skin is warm and dry.     Capillary Refill: Capillary refill takes less than 2 seconds.  Neurological:     General: No focal deficit present.     Mental Status: She is alert and oriented to person, place, and time.     Cranial Nerves: No cranial nerve deficit.     Sensory: No sensory deficit.     Motor: No weakness.     Coordination: Coordination normal.     Gait: Gait abnormal.     Comments: Antalgic gait, favoring right side  Psychiatric:        Attention and Perception: Attention and perception normal.        Mood and Affect: Mood is anxious and depressed. Affect is flat.        Speech: Speech normal.        Behavior: Behavior normal. Behavior is cooperative.        Thought Content: Thought content normal.        Cognition and Memory: Cognition and memory normal.        Judgment: Judgment normal.     Results for orders placed or performed in visit on 05/05/19  Cologuard  Result Value Ref Range   Cologuard Negative Negative      Assessment & Plan:   Problem List Items Addressed This Visit      Other   Anxiety and depression - Primary    Reports has been taking her celexa but recently has been having increased anxiety around her finances.  Reports in  the past she had taken alprazolam with good results.  Discussed options of taking buspar up to 3x per day as needed for anxiety.  Patient agreeable to trial of this medication.  Mood handout given.  Plan: 1. Continue celexa 20mg  daily 2. Begin Buspar 5mg , up to 3x daily as needed for anxiety 3. Review mood hand out given and can begin working on mindfulness, meditation, breath work as adjuncts to balancing mood 4. Follow up in 2 weeks.      Relevant Medications   busPIRone (BUSPAR) 5 MG tablet   citalopram (CELEXA) 20 MG tablet    Other Visit Diagnoses    Left hip pain       Relevant Medications   naproxen (NAPROSYN) 500 MG tablet   diclofenac Sodium (VOLTAREN) 1 % GEL   Other Relevant Orders   AMB referral to orthopedics      Meds ordered this encounter  Medications  . busPIRone (BUSPAR) 5 MG tablet    Sig: Take 1 tablet (5 mg total) by mouth 3 (three) times daily.    Dispense:  90 tablet    Refill:  1  . citalopram (CELEXA) 20 MG tablet    Sig: Take 1 tablet (20 mg total) by mouth daily.    Dispense:  90 tablet    Refill:  1  . naproxen (NAPROSYN) 500 MG tablet    Sig: Take 1 tablet (500 mg total) by mouth 2 (two) times daily with a meal.    Dispense:  30 tablet    Refill:  0  . diclofenac Sodium (VOLTAREN) 1 % GEL    Sig: Apply 2 g topically 4 (four) times daily.    Dispense:  50 g    Refill:  1      Follow up plan: Return in about 2 weeks (around 08/02/2019) for Anxiety follow up.   Harlin Rain, [place] [facility] Nurse Practition[facility] 07/19/2019, 9:46 AM

## 2019-07-19 NOTE — Assessment & Plan Note (Signed)
Reports has been taking her celexa but recently has been having increased anxiety around her finances.  Reports in the past she had taken alprazolam with good results.  Discussed options of taking buspar up to 3x per day as needed for anxiety.  Patient agreeable to trial of this medication.  Mood handout given.  Plan: 1. Continue celexa 20mg  daily 2. Begin Buspar 5mg , up to 3x daily as needed for anxiety 3. Review mood hand out given and can begin working on mindfulness, meditation, breath work as adjuncts to balancing mood 4. Follow up in 2 weeks.

## 2019-07-19 NOTE — Patient Instructions (Signed)
As we discussed, likely have a left groin strain vs other hip etiology.  I have sent in a prescription for naproxen to take 2x per day as needed for hip discomfort and topical diclofenac gel to use 3-4x per day as needed for pain.  I have put in a referral to Orthopedics for evaluation.  If you have not heard from them within 1 week to please contact our office and I will follow up with the referral coordinator.  I have sent in a prescription for Buspar, to take 1 tablet up to 3x per day, as needed for anxiety.  I have included information on mood below.  We can plan to see you back in 2 weeks to see how you are doing with this new medication.  The following recommendations are helpful adjuncts for helping rebalance your mood.  Eat a nourishing diet. Ensure adequate intake of calories, protein, carbs, fat, vitamins, and minerals. Prioritize whole foods at each meal, including meats, vegetables, fruits, nuts and seeds, etc.   Avoid inflammatory and/or "junk" foods, such as sugar, omega-6 fats, refined grains, chemicals, and preservatives are common in packaged and prepared foods. Minimize or completely avoid these ingredients and stick to whole foods with little to no additives. Cook from scratch as much as possible for more control over what you eat  Get enough sleep. Poor sleep is significantly associated with depression and anxiety. Make 7-9 hours of sleep nightly a top priority  Exercise appropriately. Exercise is known to improve brain functioning and boost mood. Aim for 30 minutes of daily physical activity. Avoid "overtraining," which can cause mental disturbances  Assess your light exposure. Not enough natural light during the day and too much artificial light can have a major impact on your mood. Get outside as often as possible during daylight hours. Minimize light exposure after dark and avoid the use of electronics that give off blue light before bed  Manage your stress.  Use daily stress  management techniques such as meditation, yoga, or mindfulness to retrain your brain to respond differently to stress. Try deep breathing to deactivate your "fight or flight" response.  There are many of sources with apps like Headspace, Calm or a variety of YouTube videos (videos from Gwynne Edinger have guided meditation)  Prioritize your social life. Work on building social support with new friends or improve current relationships. Consider getting a pet that allows for companionship, social interaction, and physical touch. Try volunteering or joining a faith-based community to increase your sense of purpose  4-7-8 breathing technique at bedtime: breathe in to count of 4, hold breath for count of 7, exhale for count of 8; do 3-5 times for letting go of overactive thoughts  Take time to play Unstructured "play" time can help reduce anxiety and depression Options for play include music, games, sports, dance, art, etc.  Try to add daily omega 3 fatty acids, magnesium, B complex, and balanced amino acid supplements to help improve mood and anxiety.  We will plan to see you back in 2 weeks for anxiety follow up  You will receive a survey after today's visit either digitally by e-mail or paper by [place] mail. Your experiences and feedback matter to [place].  Please respond so we know how we are doing as we provide care for you.  Call us with any questions/concerns/needs.  It is my goal to be available to you for your health concerns.  Thanks for choosing me to be a partner in your healthcare  needs!  Harlin Rain, FNP-C Family Nurse Practitioner [facility] Phone: [phone]

## 2019-07-21 DIAGNOSIS — M25552 Pain in left hip: Secondary | ICD-10-CM | POA: Diagnosis not present

## 2019-07-21 DIAGNOSIS — S76012A Strain of muscle, fascia and tendon of left hip, initial encounter: Secondary | ICD-10-CM | POA: Diagnosis not present

## 2019-07-29 ENCOUNTER — Other Ambulatory Visit: Payer: Self-pay | Admitting: Family Medicine

## 2019-07-29 DIAGNOSIS — E1165 Type 2 diabetes mellitus with hyperglycemia: Secondary | ICD-10-CM

## 2019-07-29 NOTE — Telephone Encounter (Signed)
Requested Prescriptions  Pending Prescriptions Disp Refills  . glimepiride (AMARYL) 2 MG tablet [Pharmacy Med Name: GLIMEPIRIDE 2 MG TABLET] 90 tablet 1    Sig: TAKE 1 TABLET BY MOUTH EVERY DAY WITH BREAKFAST     Endocrinology:  Diabetes - Sulfonylureas Passed - 07/29/2019 10:01 AM      Passed - HBA1C is between 0 and 7.9 and within 180 days    Hemoglobin A1C  Date Value Ref Range Status  04/24/2019 6.5 (A) 4.0 - 5.6 % Final   Hgb A1c MFr Bld  Date Value Ref Range Status  08/15/2018 6.1 (H) <5.7 % of total Hgb Final    Comment:    For someone without known diabetes, a hemoglobin  A1c value between 5.7% and 6.4% is consistent with prediabetes and should be confirmed with a  follow-up test. . For someone with known diabetes, a value <7% indicates that their diabetes is well controlled. A1c targets should be individualized based on duration of diabetes, age, comorbid conditions, and other considerations. . This assay result is consistent with an increased risk of diabetes. . Currently, no consensus exists regarding use of hemoglobin A1c for diagnosis of diabetes for children. Renella Cunas - Valid encounter within last 6 months    Recent Outpatient Visits          1 week ago Anxiety and depression   [facility], Lupita Raider, FNP   3 months ago Type 2 diabetes mellitus with hyperglycemia, without long-term current use of insulin [facility])   [facility], FNP   11 months ago Persistent dry cough   [facility] [place], Devonne Doughty, DO   1 year ago Type 2 diabetes mellitus with hyperglycemia, without long-term current use of insulin [facility])   [facility], Jerrel Ivory, NP   1 year ago Type 2 diabetes mellitus with hyperglycemia, without long-term current use of insulin [facility])   [facility] Merrilyn Puma, Jerrel Ivory, NP      Future Appointments            In 4 days [facility],  Lupita Raider, [facility], [place]   In 2 months [place], Lupita Raider, [facility], [facility]

## 2019-07-30 ENCOUNTER — Other Ambulatory Visit: Payer: Self-pay | Admitting: Nurse Practitioner

## 2019-07-30 DIAGNOSIS — E1165 Type 2 diabetes mellitus with hyperglycemia: Secondary | ICD-10-CM

## 2019-07-30 NOTE — Telephone Encounter (Signed)
Requested Prescriptions  Pending Prescriptions Disp Refills  . Accu-Chek Softclix Lancets lancets [Pharmacy Med Name: ACCU-CHEK SOFTCLIX LANCETS] 100 each 12    Sig: ONE TIME DAILY AS DIRECTED. FOR ICD-10 E10.9, E11.9 A1C 02/11/2018- 11.8%     Endocrinology: Diabetes - Testing Supplies Passed - 07/30/2019  1:28 PM      Passed - Valid encounter within last 12 months    Recent Outpatient Visits          1 week ago Anxiety and depression   [facility], Lupita Raider, FNP   3 months ago Type 2 diabetes mellitus with hyperglycemia, without long-term current use of insulin [facility])   [facility], FNP   11 months ago Persistent dry cough   [facility] [place], Devonne Doughty, DO   1 year ago Type 2 diabetes mellitus with hyperglycemia, without long-term current use of insulin [facility])   [facility], Jerrel Ivory, NP   1 year ago Type 2 diabetes mellitus with hyperglycemia, without long-term current use of insulin [facility])   [facility] Merrilyn Puma, Jerrel Ivory, NP      Future Appointments            In 3 days [place], Lupita Raider, [facility], [place]   In 2 months [place], Lupita Raider, [facility], [facility]

## 2019-08-02 ENCOUNTER — Other Ambulatory Visit: Payer: Self-pay

## 2019-08-02 ENCOUNTER — Encounter: Payer: Self-pay | Admitting: Family Medicine

## 2019-08-02 ENCOUNTER — Ambulatory Visit (INDEPENDENT_AMBULATORY_CARE_PROVIDER_SITE_OTHER): Payer: Medicare HMO | Admitting: Family Medicine

## 2019-08-02 DIAGNOSIS — F329 Major depressive disorder, single episode, unspecified: Secondary | ICD-10-CM

## 2019-08-02 DIAGNOSIS — F32A Depression, unspecified: Secondary | ICD-10-CM

## 2019-08-02 DIAGNOSIS — F419 Anxiety disorder, unspecified: Secondary | ICD-10-CM | POA: Diagnosis not present

## 2019-08-02 DIAGNOSIS — E1165 Type 2 diabetes mellitus with hyperglycemia: Secondary | ICD-10-CM

## 2019-08-02 MED ORDER — BUSPIRONE HCL 5 MG PO TABS: 5.0000 mg | ORAL_TABLET | Freq: Every day | ORAL | 1 refills | Status: DC

## 2019-08-02 NOTE — Progress Notes (Signed)
Subjective:    Patient ID: Maria Sloan, female    DOB: December 04, 1950, 69 y.o.   MRN: [id number]  Maria Sloan is a 69 y.o. female presenting on 08/02/2019 for Anxiety   HPI  Maria Sloan presents to clinic for re-evaluation on her anxiety since starting buspar 5mg  two weeks ago.  Reports she has been taking the buspar 5mg  once at bedtime instead of 3x per day, as it was making her drowsy during the day.  Reports adequate control over her anxiety with this medication with taking it at bedtime daily.  Denies any acute concerns today.  Depression screen Maria Sloan 2/9 07/19/2019 05/23/2019 04/24/2019  Decreased Interest 1 0 0  Down, Depressed, Hopeless 1 1 1   PHQ - 2 Score 2 1 1   Altered sleeping 1 1 1   Tired, decreased energy 1 0 0  Change in appetite 0 0 0  Feeling bad or failure about yourself  2 0 0  Trouble concentrating 0 0 0  Moving slowly or fidgety/restless 0 0 0  Suicidal thoughts 0 0 0  PHQ-9 Score 6 2 2   Difficult doing work/chores Very difficult Somewhat difficult Not difficult at all    Social History   Tobacco Use  . Smoking status: Former Smoker    Packs/day: 1.00    Years: 25.00    Pack years: 25.00    Types: Cigarettes    Quit date: 11/29/1994    Years since quitting: 24.6  . Smokeless tobacco: Never Used  Substance Use Topics  . Alcohol use: Never    Alcohol/week: 0.0 standard drinks  . Drug use: Never    Review of Systems  Constitutional: Negative.   HENT: Negative.   Eyes: Negative.   Respiratory: Negative.   Cardiovascular: Negative.   Gastrointestinal: Negative.   Endocrine: Negative.   Genitourinary: Negative.   Musculoskeletal: Negative.   Skin: Negative.   Allergic/Immunologic: Negative.   Neurological: Negative.   Hematological: Negative.   Psychiatric/Behavioral: Negative.    Per HPI unless specifically indicated above     Objective:    BP 111/62 (BP Location: Right Arm, Patient Position: Sitting, Cuff Size: Normal)   Pulse 61   Temp (!) 97.1  F (36.2 C) (Temporal)   Ht 5\' 5"  (1.651 m)   Wt 181 lb 12.8 oz (82.5 kg)   BMI 30.25 kg/m   Wt Readings from Last 3 Encounters:  08/02/19 181 lb 12.8 oz (82.5 kg)  07/19/19 180 lb 6.4 oz (81.8 kg)  05/23/19 183 lb (83 kg)    Physical Exam Vitals reviewed.  Constitutional:      General: She is not in acute distress.    Appearance: Normal appearance. She is well-developed and well-groomed. She is obese. She is not ill-appearing or toxic-appearing.  HENT:     Head: Normocephalic.     Nose:     Comments: Maria Sloan is in place, covering mouth and nose Eyes:     General: Lids are normal. Vision grossly intact.        Right eye: No discharge.        Left eye: No discharge.     Extraocular Movements: Extraocular movements intact.     Conjunctiva/sclera: Conjunctivae normal.     Pupils: Pupils are equal, round, and reactive to light.  Cardiovascular:     Rate and Rhythm: Normal rate and regular rhythm.     Pulses: Normal pulses.          Dorsalis pedis pulses are 2+ on the right  side and 2+ on the left side.       Posterior tibial pulses are 2+ on the right side and 2+ on the left side.     Heart sounds: Normal heart sounds. No murmur. No friction rub. No gallop.   Pulmonary:     Effort: Pulmonary effort is normal. No respiratory distress.     Breath sounds: Normal breath sounds.  Musculoskeletal:     Right lower leg: No edema.     Left lower leg: No edema.  Feet:     Right foot:     Skin integrity: Skin integrity normal.     Toenail Condition: Right toenails are normal.     Left foot:     Skin integrity: Skin integrity normal.     Toenail Condition: Left toenails are normal.  Skin:    General: Skin is warm and dry.     Capillary Refill: Capillary refill takes less than 2 seconds.  Neurological:     General: No focal deficit present.     Mental Status: She is alert and oriented to person, place, and time.     Cranial Nerves: No cranial nerve deficit.     Sensory: No  sensory deficit.     Motor: No weakness.     Coordination: Coordination normal.     Gait: Gait normal.  Psychiatric:        Attention and Perception: Attention and perception normal.        Mood and Affect: Mood and affect normal.        Speech: Speech normal.        Behavior: Behavior normal. Behavior is cooperative.        Thought Content: Thought content normal.        Cognition and Memory: Cognition and memory normal.        Judgment: Judgment normal.    Results for orders placed or performed in visit on 05/05/19  Cologuard  Result Value Ref Range   Cologuard Negative Negative      Assessment & Plan:   Problem List Items Addressed This Visit      Endocrine   Type 2 diabetes mellitus with hyperglycemia ([place])    DM foot exam done today without any acute findings.  Absent of callus and all other lesions.  Some dry skin noted, but pt reports regular foot care and can see bottom of her feet.         Other   Anxiety and depression    Has been taking her citalopram 20mg  daily and has changed her buspar dosing from TID to QHS with adequate control of her anxiety symptoms.  Continue to work on anxiety reduction techniques and can refer back to mood handout reviewed at last visit 2 weeks ago.  Plan: 1. Continue citalopram 20mg  daily 2. Continue buspar 5mg  PO QHS PRN 3. Follow up in 3 months      Relevant Medications   busPIRone (BUSPAR) 5 MG tablet      Meds ordered this encounter  Medications  . busPIRone (BUSPAR) 5 MG tablet    Sig: Take 1 tablet (5 mg total) by mouth at bedtime.    Dispense:  90 tablet    Refill:  1      Follow up plan: Return in about 3 months (around 11/02/2019) for Diabetes, A1C.   Maria Sloan, [place] [facility] Nurse Practitioner [facility] 08/02/2019, 9:46 AM

## 2019-08-02 NOTE — Assessment & Plan Note (Signed)
Has been taking her citalopram 20mg  daily and has changed her buspar dosing from TID to QHS with adequate control of her anxiety symptoms.  Continue to work on anxiety reduction techniques and can refer back to mood handout reviewed at last visit 2 weeks ago.  Plan: 1. Continue citalopram 20mg  daily 2. Continue buspar 5mg  PO QHS PRN 3. Follow up in 3 months

## 2019-08-02 NOTE — Assessment & Plan Note (Signed)
DM foot exam done today without any acute findings.  Absent of callus and all other lesions.  Some dry skin noted, but pt reports regular foot care and can see bottom of her feet.

## 2019-08-02 NOTE — Patient Instructions (Signed)
For Mammogram screening for breast cancer   Call the [place] below anytime to schedule your own appointment now that order has been placed.  [facility] [place], [place] [address] Phone: [phone]   [facility] [address] [place], [place] [address] Phone: [phone]  I have sent in a refill on your buspar to take as directed.  We will plan to see you back in 3 months for diabetes re-evaluation  You will receive a survey after today's visit either digitally by e-mail or paper by [place] mail. Your experiences and feedback matter to [place].  Please respond so we know how we are doing as we provide care for you.  Call us with any questions/concerns/needs.  It is my goal to be available to you for your health concerns.  Thanks for choosing me to be a partner in your healthcare needs!  Harlin Rain, FNP-C Family Nurse Practitioner [facility] Phone: [phone]

## 2019-08-17 ENCOUNTER — Other Ambulatory Visit: Payer: Self-pay

## 2019-08-17 DIAGNOSIS — R053 Chronic cough: Secondary | ICD-10-CM

## 2019-08-17 DIAGNOSIS — R0982 Postnasal drip: Secondary | ICD-10-CM

## 2019-08-17 MED ORDER — FLUTICASONE PROPIONATE 50 MCG/ACT NA SUSP: NASAL | 0 refills | Status: AC

## 2019-08-24 ENCOUNTER — Other Ambulatory Visit: Payer: Self-pay

## 2019-08-24 ENCOUNTER — Ambulatory Visit: Payer: Medicare HMO

## 2019-08-24 LAB — HM DIABETES EYE EXAM

## 2019-09-13 ENCOUNTER — Ambulatory Visit (INDEPENDENT_AMBULATORY_CARE_PROVIDER_SITE_OTHER): Payer: Medicare HMO | Admitting: Family Medicine

## 2019-09-13 ENCOUNTER — Other Ambulatory Visit: Payer: Self-pay

## 2019-09-13 ENCOUNTER — Encounter: Payer: Self-pay | Admitting: Family Medicine

## 2019-09-13 VITALS — BP 118/56 | HR 64 | Temp 97.1°F | Ht 65.0 in | Wt 183.4 lb

## 2019-09-13 DIAGNOSIS — L089 Local infection of the skin and subcutaneous tissue, unspecified: Secondary | ICD-10-CM

## 2019-09-13 DIAGNOSIS — E1165 Type 2 diabetes mellitus with hyperglycemia: Secondary | ICD-10-CM

## 2019-09-13 DIAGNOSIS — Z23 Encounter for immunization: Secondary | ICD-10-CM | POA: Diagnosis not present

## 2019-09-13 DIAGNOSIS — S91332A Puncture wound without foreign body, left foot, initial encounter: Secondary | ICD-10-CM

## 2019-09-13 MED ORDER — SULFAMETHOXAZOLE-TRIMETHOPRIM 800-160 MG PO TABS: 1.0000 | ORAL_TABLET | Freq: Two times a day (BID) | ORAL | 0 refills | Status: AC

## 2019-09-13 NOTE — Progress Notes (Signed)
Subjective:    Patient ID: Maria Sloan, female    DOB: 09/16/1950, 69 y.o.   MRN: [id number]  Maria Sloan is a 69 y.o. female presenting on 09/13/2019 for Plantar Warts (possible a wart on the bottom of the left heel x 1 week. Pt has had plantar warts in the past that she had removed.)   HPI  Ms. Draeger presents to clinic for concerns of left foot discomfort x 1 week.  Reports may be a plantar wart, that she has gotten them in the past.  Has pain with palpation to the left heel and when walking without shoes on.  Denies fevers, numbness, tingling, weakness, discharge/drainage from site of discomfort, loss of feeling.  Has not taken anything for her symptoms.  Depression screen [facility] 2/9 08/02/2019 07/19/2019 05/23/2019  Decreased Interest 0 1 0  Down, Depressed, Hopeless 0 1 1  PHQ - 2 Score 0 2 1  Altered sleeping 0 1 1  Tired, decreased energy 0 1 0  Change in appetite 0 0 0  Feeling bad or failure about yourself  0 2 0  Trouble concentrating 0 0 0  Moving slowly or fidgety/restless 0 0 0  Suicidal thoughts 0 0 0  PHQ-9 Score 0 6 2  Difficult doing work/chores Not difficult at all Very difficult Somewhat difficult    Social History   Tobacco Use  . Smoking status: Former Smoker    Packs/day: 1.00    Years: 25.00    Pack years: 25.00    Types: Cigarettes    Quit date: 11/29/1994    Years since quitting: 24.8  . Smokeless tobacco: Never Used  Vaping Use  . Vaping Use: Never used  Substance Use Topics  . Alcohol use: Never    Alcohol/week: 0.0 standard drinks  . Drug use: Never    Review of Systems  Constitutional: Negative.   HENT: Negative.   Eyes: Negative.   Respiratory: Negative.   Cardiovascular: Negative.   Gastrointestinal: Negative.   Endocrine: Negative.   Genitourinary: Negative.   Musculoskeletal: Negative.   Skin: Positive for wound. Negative for color change, pallor and rash.  Allergic/Immunologic: Negative.   Neurological: Negative.   Hematological:  Negative.   Psychiatric/Behavioral: Negative.    Per HPI unless specifically indicated above     Objective:    BP (!) 118/56 (BP Location: Left Arm, Patient Position: Sitting, Cuff Size: Normal)   Pulse 64   Temp (!) 97.1 F (36.2 C) (Temporal)   Ht 5\' 5"  (1.651 m)   Wt 183 lb 6.4 oz (83.2 kg)   SpO2 99%   BMI 30.52 kg/m   Wt Readings from Last 3 Encounters:  09/13/19 183 lb 6.4 oz (83.2 kg)  08/02/19 181 lb 12.8 oz (82.5 kg)  07/19/19 180 lb 6.4 oz (81.8 kg)    Physical Exam Vitals reviewed.  Constitutional:      General: She is not in acute distress.    Appearance: Normal appearance. She is well-developed and well-groomed. She is obese. She is not ill-appearing or toxic-appearing.  HENT:     Head: Normocephalic and atraumatic.     Nose:     Comments: [name] is in place, covering mouth and nose. Eyes:     General: Lids are normal. Vision grossly intact.        Right eye: No discharge.        Left eye: No discharge.     Extraocular Movements: Extraocular movements intact.     Conjunctiva/sclera:  Conjunctivae normal.     Pupils: Pupils are equal, round, and reactive to light.  Cardiovascular:     Pulses: Normal pulses.          Dorsalis pedis pulses are 2+ on the right side and 2+ on the left side.  Pulmonary:     Effort: Pulmonary effort is normal. No respiratory distress.  Musculoskeletal:     Right lower leg: No edema.     Left lower leg: No edema.       Feet:  Feet:     Left foot:     Skin integrity: Skin integrity normal.  Skin:    General: Skin is warm and dry.     Capillary Refill: Capillary refill takes less than 2 seconds.  Neurological:     General: No focal deficit present.     Mental Status: She is alert and oriented to person, place, and time.  Psychiatric:        Attention and Perception: Attention and perception normal.        Mood and Affect: Mood and affect normal.        Speech: Speech normal.        Behavior: Behavior normal. Behavior  is cooperative.        Thought Content: Thought content normal.        Cognition and Memory: Cognition and memory normal.        Judgment: Judgment normal.    Results for orders placed or performed in visit on 08/31/19  HM DIABETES EYE EXAM  Result Value Ref Range   HM Diabetic Eye Exam No Retinopathy No Retinopathy      Assessment & Plan:   Problem List Items Addressed This Visit      Endocrine   Type 2 diabetes mellitus with hyperglycemia ([place])    See skin infection A/P      Relevant Orders   Ambulatory referral to Podiatry     Musculoskeletal and Integument   Skin infection - Primary    Left heel puncture wound from approx 1 week ago.  Slight erythema around puncture wound and pain with walking without shoes.  Will cover with antibiotic, as has diabetes, and referral to podiatry to establish care.  Plan: 1. Begin bactrim DS, 1 tablet 2x per day for the next 5 days 2. TDAP immunization given due to puncture wound 3. Referral to podiatry placed 4. RTC PRN      Relevant Medications   sulfamethoxazole-trimethoprim (BACTRIM DS) 800-160 MG tablet     Other   Immunization due    Immunization due for Pneumococcal 23.  Reviewed risks/benefits and side effects.  VIS provided.  Plan: 1. Pneumococcal 23 vaccination given       Other Visit Diagnoses    Need for prophylactic vaccination with Streptococcus pneumoniae (Pneumococcus) and Influenza vaccines       Relevant Orders   Pneumococcal polysaccharide vaccine 23-valent greater than or equal to 2yo subcutaneous/IM   Puncture wound of left foot, initial encounter       Relevant Orders   Tdap vaccine greater than or equal to 7yo IM      Meds ordered this encounter  Medications  . sulfamethoxazole-trimethoprim (BACTRIM DS) 800-160 MG tablet    Sig: Take 1 tablet by mouth 2 (two) times daily for 5 days.    Dispense:  10 tablet    Refill:  0      Follow up plan: Return if symptoms worsen or fail to improve.  Harlin Rain, [place] Family Nurse Practitioner [facility] 09/13/2019, 9:57 AM

## 2019-09-13 NOTE — Patient Instructions (Addendum)
I have sent in a prescription for Bactrim DS to take 1 tablet 2x per day for the next 5 days for the left foot skin puncture.  We have immunized you today with TDAP and Pneumococcal 23.  I have placed a referral to podiatry for you to establish.  Foot care is very important, especially in patients that have diabetes.  Advice on Protecting Your Feet   1. Daily foot inspections - Look for any breaks in the skin or areas of irritation such as blisters or red areas. Report to primary doctor or foot nurse immediately if any problems occur.  - If you have vision problems and cannot see your feet well or it is hard for you to reach your feet, ask a family member to inspect your feet daily.   2. Daily foot hygiene  - Wash feet daily, but do not soak your feet in hot water. If you do have callus, you may use warm water epsom salt foot soak and use moisturizer after. - Dry well especially between toes; Pat dry do not rub.  - If your skin is dry use a lotion to moisturize but never between the toes.  - If your skin is wet from perspiration, use an antifungal foot powder daily.   3. Shoes and Socks  - Wear a clean pair of socks daily.  - Make sure your shoes fit well and that you are measured and properly fit each time you purchase shoes. Your shoe size may change.  - Powder your shoes with a small amount of an antifungal foot powder daily, as the shoe is the only article of clothing that is not laundered.  - Wear appropriate shoes and socks for the weather. It is especially important to protect your feet from the cold; however, don't forget about sunscreen to the tops of your feet in the hot sun.  - Wear well fitting shoes rather than slippers or flip-flops when walking or standing for long period of time.  - When at home make sure you always have protective foot wear on your feet.   We will plan to see you back if symptoms worsen or fail to improve  You will receive a survey after today's visit  either digitally by e-mail or paper by USPS mail. Your experiences and feedback matter to [place].  Please respond so we know how we are doing as we provide care for you.  Call us with any questions/concerns/needs.  It is my goal to be available to you for your health concerns.  Thanks for choosing me to be a partner in your healthcare needs!  Harlin Rain, FNP-C Family Nurse Practitioner [facility] Phone: [phone]

## 2019-09-13 NOTE — Assessment & Plan Note (Signed)
See skin infection A/P

## 2019-09-13 NOTE — Assessment & Plan Note (Signed)
Immunization due for Pneumococcal 23.  Reviewed risks/benefits and side effects.  VIS provided.  Plan: 1. Pneumococcal 23 vaccination given

## 2019-09-13 NOTE — Assessment & Plan Note (Signed)
Left heel puncture wound from approx 1 week ago.  Slight erythema around puncture wound and pain with walking without shoes.  Will cover with antibiotic, as has diabetes, and referral to podiatry to establish care.  Plan: 1. Begin bactrim DS, 1 tablet 2x per day for the next 5 days 2. TDAP immunization given due to puncture wound 3. Referral to podiatry placed 4. RTC PRN

## 2019-09-25 ENCOUNTER — Other Ambulatory Visit: Payer: Self-pay | Admitting: Family Medicine

## 2019-09-25 DIAGNOSIS — E1165 Type 2 diabetes mellitus with hyperglycemia: Secondary | ICD-10-CM

## 2019-09-27 ENCOUNTER — Ambulatory Visit
Admission: RE | Admit: 2019-09-27 | Discharge: 2019-09-27 | Disposition: A | Discharge status: Home / Self Care | Payer: Medicare HMO | Source: Ambulatory Visit | Attending: Family Medicine | Admitting: Family Medicine

## 2019-09-27 DIAGNOSIS — Z1231 Encounter for screening mammogram for malignant neoplasm of breast: Secondary | ICD-10-CM | POA: Diagnosis not present

## 2019-09-29 ENCOUNTER — Other Ambulatory Visit: Payer: Self-pay | Admitting: Family Medicine

## 2019-09-29 DIAGNOSIS — F419 Anxiety disorder, unspecified: Secondary | ICD-10-CM

## 2019-09-29 DIAGNOSIS — F32A Depression, unspecified: Secondary | ICD-10-CM

## 2019-10-23 ENCOUNTER — Ambulatory Visit: Payer: Medicare HMO | Admitting: Family Medicine

## 2019-10-23 ENCOUNTER — Other Ambulatory Visit: Payer: Self-pay

## 2019-10-23 ENCOUNTER — Ambulatory Visit (INDEPENDENT_AMBULATORY_CARE_PROVIDER_SITE_OTHER): Payer: Medicare HMO | Admitting: Family Medicine

## 2019-10-23 ENCOUNTER — Encounter: Payer: Self-pay | Admitting: Family Medicine

## 2019-10-23 VITALS — BP 115/59 | HR 68 | Temp 98.3°F | Resp 17 | Ht 65.0 in | Wt 182.0 lb

## 2019-10-23 DIAGNOSIS — F329 Major depressive disorder, single episode, unspecified: Secondary | ICD-10-CM

## 2019-10-23 DIAGNOSIS — F419 Anxiety disorder, unspecified: Secondary | ICD-10-CM

## 2019-10-23 DIAGNOSIS — E1165 Type 2 diabetes mellitus with hyperglycemia: Secondary | ICD-10-CM

## 2019-10-23 DIAGNOSIS — E782 Mixed hyperlipidemia: Secondary | ICD-10-CM

## 2019-10-23 DIAGNOSIS — F32A Depression, unspecified: Secondary | ICD-10-CM

## 2019-10-23 LAB — POCT GLYCOSYLATED HEMOGLOBIN (HGB A1C): Hemoglobin A1C: 7.1 % — AB (ref 4.0–5.6)

## 2019-10-23 NOTE — Progress Notes (Signed)
Subjective:    Patient ID: Maria Sloan, female    DOB: [birth year], 69 y.o.   MRN: [id number]  Izola Teague is a 70 y.o. female presenting on 10/23/2019 for Diabetes (fasting bs averaging 150- 160)   HPI   Diabetes Pt presents today for follow up Type 2 Diabetes Mellitus.  He/she (caps): She ACTION; IS/IS NOT: is checking AM CBG at home with range of 150-160. -Current diabetic medications include: Metformin 1000mg  BID WC -ACTION; IS/IS NOT: is not currently symptomatic -Actions; denies/reports/admits to: denies polydipsia, polyphagia, polyuria, headaches, diaphoresis, shakiness, chills, pain, numbness or tingling in extremities or changes in vision -Clinical course has been stable -Reports no structured exercise routine -Diet is moderate in salt, moderate in fat, and moderate in carbohydrates  PREVENTION Eye exam current (within 1 year) Up to date Foot exam current (within 1 year) Completed today Lipid/ASCVD risk reduction - on statin: YES/NO: Yes  Kidney Protection (On ACE/ARB)? YES/NO: Yes     Depression screen [facility] 2/9 08/02/2019 07/19/2019 05/23/2019  Decreased Interest 0 1 0  Down, Depressed, Hopeless 0 1 1  PHQ - 2 Score 0 2 1  Altered sleeping 0 1 1  Tired, decreased energy 0 1 0  Change in appetite 0 0 0  Feeling bad or failure about yourself  0 2 0  Trouble concentrating 0 0 0  Moving slowly or fidgety/restless 0 0 0  Suicidal thoughts 0 0 0  PHQ-9 Score 0 6 2  Difficult doing work/chores Not difficult at all Very difficult Somewhat difficult    Social History   Tobacco Use  . Smoking status: Former Smoker    Packs/day: 1.00    Years: 25.00    Pack years: 25.00    Types: Cigarettes    Quit date: 11/29/1994    Years since quitting: 24.9  . Smokeless tobacco: Never Used  Vaping Use  . Vaping Use: Never used  Substance Use Topics  . Alcohol use: Never    Alcohol/week: 0.0 standard drinks  . Drug use: Never    Review of Systems  Constitutional: Negative.     HENT: Negative.   Eyes: Negative.   Respiratory: Negative.   Cardiovascular: Negative.   Gastrointestinal: Negative.   Endocrine: Negative.   Genitourinary: Negative.   Musculoskeletal: Negative.   Skin: Negative.   Allergic/Immunologic: Negative.   Neurological: Negative.   Hematological: Negative.   Psychiatric/Behavioral: Negative.    Per HPI unless specifically indicated above     Objective:    BP (!) 115/59 (BP Location: Right Arm, Patient Position: Sitting, Cuff Size: Normal)   Pulse 68   Temp 98.3 F (36.8 C) (Oral)   Resp 17   Ht 5\' 5"  (1.651 m)   Wt 182 lb (82.6 kg)   SpO2 98%   BMI 30.29 kg/m   Wt Readings from Last 3 Encounters:  10/23/19 182 lb (82.6 kg)  09/13/19 183 lb 6.4 oz (83.2 kg)  08/02/19 181 lb 12.8 oz (82.5 kg)    Physical Exam Vitals reviewed.  Constitutional:      General: She is not in acute distress.    Appearance: Normal appearance. She is well-developed and well-groomed. She is not ill-appearing or toxic-appearing.  HENT:     Head: Normocephalic and atraumatic.     Nose:     Comments: Maria Sloan is in place, covering mouth and nose. Eyes:     General: Lids are normal. Vision grossly intact.        Right eye: No  discharge.        Left eye: No discharge.     Extraocular Movements: Extraocular movements intact.     Conjunctiva/sclera: Conjunctivae normal.     Pupils: Pupils are equal, round, and reactive to light.  Cardiovascular:     Rate and Rhythm: Normal rate and regular rhythm.     Pulses: Normal pulses.          Dorsalis pedis pulses are 2+ on the right side and 2+ on the left side.     Heart sounds: Normal heart sounds. No murmur heard.  No friction rub. No gallop.   Pulmonary:     Effort: Pulmonary effort is normal. No respiratory distress.     Breath sounds: Normal breath sounds.  Musculoskeletal:     Right lower leg: No edema.     Left lower leg: No edema.  Feet:     Comments: Absent of callus and all other lesions.   Some dry skin noted, but pt reports regular foot care and can see bottom of her feet.  Skin:    General: Skin is warm and dry.     Capillary Refill: Capillary refill takes less than 2 seconds.  Neurological:     General: No focal deficit present.     Mental Status: She is alert and oriented to person, place, and time.  Psychiatric:        Attention and Perception: Attention and perception normal.        Mood and Affect: Mood and affect normal.        Speech: Speech normal.        Behavior: Behavior normal. Behavior is cooperative.        Thought Content: Thought content normal.        Cognition and Memory: Cognition and memory normal.        Judgment: Judgment normal.    Results for orders placed or performed in visit on 10/23/19  POCT glycosylated hemoglobin (Hb A1C)  Result Value Ref Range   Hemoglobin A1C 7.1 (A) 4.0 - 5.6 %   HbA1c POC (<> result, manual entry)     HbA1c, POC (prediabetic range)     HbA1c, POC (controlled diabetic range)        Assessment & Plan:   Problem List Items Addressed This Visit      Endocrine   Type 2 diabetes mellitus with hyperglycemia ([place]) - Primary    Well-controlledDM with A1c 7.1% Worsening control from 6.5% on 04/24/2019 and goal A1c < 7.0%. - No known complications  Plan:  1. Continue current therapy: metformin 1000mg  twice daily 2. Encourage improved lifestyle: - low carb/low glycemic diet reinforced prior education - Increase physical activity to 30 minutes most days of the week.  Explained that increased physical activity increases body's use of sugar for energy. 3. Check fasting am CBG and log these.  Bring log to next visit for review 4. Continue ACEi and Statin 5. DM Foot exam done today with no acute findings (unable to document in screening due to IT error) 6. Follow-up 3 months       Relevant Orders   POCT glycosylated hemoglobin (Hb A1C) (Completed)     Other   Anxiety and depression    Stable and well controlled  with citalopram 20mg  daily and buspar 5mg  QHS PRN.  Has continued to work on anxiety reduction techniques.    Plan: 1. Continue citalopram 20mg  daily 2. Continue buspar 5mg  QHS PRN 3. RTC in 3  months      Mixed dyslipidemia    Stable and well controlled.  Currently taking atorvastatin 20mg  and ezetimibe 10mg  daily and tolerating well.  -Reviewed common side effects of myalgia (reversible off med), less common side effects of cognitive impairment (reversible off med), increased glucose, rhabdomyolysis.  Plan: 1) Labs at next visit 2) Continue atorvastatin 20mg  and ezetimibe 10mg  daily  3) Heart healthy diet and to exercise every other day for 30 minutes per day, going no more than 2 days in a row without exercise. 4) We will see you back in 3 months          No orders of the defined types were placed in this encounter.     Follow up plan: Return in about 3 months (around 01/23/2020) for DM, A1C F/U.   Harlin Rain, [place] Family Nurse Practitioner [facility] 10/23/2019, 4:47 PM

## 2019-10-23 NOTE — Assessment & Plan Note (Addendum)
Well-controlledDM with A1c 7.1% Worsening control from 6.5% on 04/24/2019 and goal A1c < 7.0%. - No known complications  Plan:  1. Continue current therapy: metformin 1000mg  twice daily 2. Encourage improved lifestyle: - low carb/low glycemic diet reinforced prior education - Increase physical activity to 30 minutes most days of the week.  Explained that increased physical activity increases body's use of sugar for energy. 3. Check fasting am CBG and log these.  Bring log to next visit for review 4. Continue ACEi and Statin 5. DM Foot exam done today with no acute findings (unable to document in screening due to IT error) 6. Follow-up 3 months

## 2019-10-23 NOTE — Patient Instructions (Signed)
Continue all medications as prescribed.  Advice on Protecting Your Feet   1. Daily foot inspections - Look for any breaks in the skin or areas of irritation such as blisters or red areas. Report to primary doctor or foot nurse immediately if any problems occur.  - If you have vision problems and cannot see your feet well or it is hard for you to reach your feet, ask a family member to inspect your feet daily.   2. Daily foot hygiene  - Wash feet daily, but do not soak your feet in hot water. If you do have callus, you may use warm water epsom salt foot soak and use moisturizer after. - Dry well especially between toes; Pat dry do not rub.  - If your skin is dry use a lotion to moisturize but never between the toes.  - If your skin is wet from perspiration, use an antifungal foot powder daily.   3. Shoes and Socks  - Wear a clean pair of socks daily.  - Make sure your shoes fit well and that you are measured and properly fit each time you purchase shoes. Your shoe size may change.  - Powder your shoes with a small amount of an antifungal foot powder daily, as the shoe is the only article of clothing that is not laundered.  - Wear appropriate shoes and socks for the weather. It is especially important to protect your feet from the cold; however, don't forget about sunscreen to the tops of your feet in the hot sun.  - Wear well fitting shoes rather than slippers or flip-flops when walking or standing for long period of time.  - When at home make sure you always have protective foot wear on your feet.   You can learn more information online about your diabetes at American Diabetes Association: http://www.diabetes.org/ - General self-care (diet, medications, blood sugar checks). - Diet recommendations - There are even recipes available for you to look at and try.  We will plan to see you back in 3 months for diabetes follow up visit  You will receive a survey after today's visit either  digitally by e-mail or paper by [place] mail. Your experiences and feedback matter to [place].  Please respond so we know how we are doing as we provide care for you.  Call us with any questions/concerns/needs.  It is my goal to be available to you for your health concerns.  Thanks for choosing me to be a partner in your healthcare needs!  Harlin Rain, FNP-C Family Nurse Practitioner [facility] Phone: [phone]

## 2019-10-23 NOTE — Assessment & Plan Note (Signed)
Stable and well controlled with citalopram 20mg  daily and buspar 5mg  QHS PRN.  Has continued to work on anxiety reduction techniques.    Plan: 1. Continue citalopram 20mg  daily 2. Continue buspar 5mg  QHS PRN 3. RTC in 3 months

## 2019-10-23 NOTE — Assessment & Plan Note (Signed)
Stable and well controlled.  Currently taking atorvastatin 20mg  and ezetimibe 10mg  daily and tolerating well.  -Reviewed common side effects of myalgia (reversible off med), less common side effects of cognitive impairment (reversible off med), increased glucose, rhabdomyolysis.  Plan: 1) Labs at next visit 2) Continue atorvastatin 20mg  and ezetimibe 10mg  daily  3) Heart healthy diet and to exercise every other day for 30 minutes per day, going no more than 2 days in a row without exercise. 4) We will see you back in 3 months

## 2019-11-01 ENCOUNTER — Other Ambulatory Visit: Payer: Self-pay | Admitting: Family Medicine

## 2019-11-01 DIAGNOSIS — F419 Anxiety disorder, unspecified: Secondary | ICD-10-CM

## 2019-11-01 DIAGNOSIS — F32A Depression, unspecified: Secondary | ICD-10-CM

## 2019-11-08 ENCOUNTER — Other Ambulatory Visit: Payer: Self-pay | Admitting: Family Medicine

## 2019-11-08 DIAGNOSIS — E782 Mixed hyperlipidemia: Secondary | ICD-10-CM

## 2019-11-08 NOTE — Telephone Encounter (Signed)
Requested Prescriptions  Pending Prescriptions Disp Refills   ezetimibe (ZETIA) 10 MG tablet [Pharmacy Med Name: EZETIMIBE 10 MG TABLET] 90 tablet 1    Sig: TAKE 1 TABLET BY MOUTH EVERY DAY     Cardiovascular:  Antilipid - Sterol Transport Inhibitors Failed - 11/08/2019  1:29 AM      Failed - HDL in normal range and within 360 days    HDL  Date Value Ref Range Status  05/11/2019 37 (L) > OR = 50 mg/dL Final         Failed - Triglycerides in normal range and within 360 days    Triglycerides  Date Value Ref Range Status  05/11/2019 228 (H) <150 mg/dL Final    Comment:    . If a non-fasting specimen was collected, consider repeat triglyceride testing on a fasting specimen if clinically indicated.  Yates Decamp et al. J. of Clin. Lipidol. 5732;2:025-427. Marland Kitchen          Passed - Total Cholesterol in normal range and within 360 days    Cholesterol  Date Value Ref Range Status  05/11/2019 151 <200 mg/dL Final         Passed - LDL in normal range and within 360 days    LDL Cholesterol (Calc)  Date Value Ref Range Status  05/11/2019 82 mg/dL (calc) Final    Comment:    Reference range: <100 . Desirable range <100 mg/dL for primary prevention;   <70 mg/dL for patients with CHD or diabetic patients  with > or = 2 CHD risk factors. Marland Kitchen LDL-C is now calculated using the Martin-Hopkins  calculation, which is a validated novel method providing  better accuracy than the Friedewald equation in the  estimation of LDL-C.  Cresenciano Genre et al. Annamaria Helling. 0623;762(83): 2061-2068  (http://education.Quest[employer].com/faq/FAQ164)    Direct LDL  Date Value Ref Range Status  12/13/2015 115.0 mg/dL Final    Comment:    Optimal:  <100 mg/dLNear or Above Optimal:  100-129 mg/dLBorderline High:  130-159 mg/dLHigh:  160-189 mg/dLVery High:  >190 mg/dL         Passed - Valid encounter within last 12 months    Recent Outpatient Visits          2 weeks ago Type 2 diabetes mellitus with hyperglycemia,  without long-term current use of insulin ([place])   [facility], Lupita Raider, FNP   1 month ago Skin infection   [facility], Lupita Raider, FNP   3 months ago Anxiety and depression   [facility], Lupita Raider, FNP   3 months ago Anxiety and depression   [facility], Lupita Raider, FNP   6 months ago Type 2 diabetes mellitus with hyperglycemia, without long-term current use of insulin [facility])   [facility], Lupita Raider, [place]

## 2019-11-30 ENCOUNTER — Other Ambulatory Visit: Payer: Self-pay | Admitting: Family Medicine

## 2019-11-30 DIAGNOSIS — E782 Mixed hyperlipidemia: Secondary | ICD-10-CM

## 2019-11-30 NOTE — Telephone Encounter (Signed)
Requested Prescriptions  Pending Prescriptions Disp Refills   atorvastatin (LIPITOR) 20 MG tablet [Pharmacy Med Name: ATORVASTATIN 20 MG TABLET] 90 tablet 1    Sig: TAKE 1 TABLET BY MOUTH EVERY DAY     Cardiovascular:  Antilipid - Statins Failed - 11/30/2019  1:34 AM      Failed - HDL in normal range and within 360 days    HDL  Date Value Ref Range Status  05/11/2019 37 (L) > OR = 50 mg/dL Final         Failed - Triglycerides in normal range and within 360 days    Triglycerides  Date Value Ref Range Status  05/11/2019 228 (H) <150 mg/dL Final    Comment:    . If a non-fasting specimen was collected, consider repeat triglyceride testing on a fasting specimen if clinically indicated.  Yates Decamp et al. J. of Clin. Lipidol. 9826;4:158-309. Marland Kitchen          Passed - Total Cholesterol in normal range and within 360 days    Cholesterol  Date Value Ref Range Status  05/11/2019 151 <200 mg/dL Final         Passed - LDL in normal range and within 360 days    LDL Cholesterol (Calc)  Date Value Ref Range Status  05/11/2019 82 mg/dL (calc) Final    Comment:    Reference range: <100 . Desirable range <100 mg/dL for primary prevention;   <70 mg/dL for patients with CHD or diabetic patients  with > or = 2 CHD risk factors. Marland Kitchen LDL-C is now calculated using the Martin-Hopkins  calculation, which is a validated novel method providing  better accuracy than the Friedewald equation in the  estimation of LDL-C.  Cresenciano Genre et al. Annamaria Helling. 4076;808(81): 2061-2068  (http://education.[facility].com/faq/FAQ164)    Direct LDL  Date Value Ref Range Status  12/13/2015 115.0 mg/dL Final    Comment:    Optimal:  <100 mg/dLNear or Above Optimal:  100-129 mg/dLBorderline High:  130-159 mg/dLHigh:  160-189 mg/dLVery High:  >190 mg/dL         Passed - Patient is not pregnant      Passed - Valid encounter within last 12 months    Recent Outpatient Visits          1 month ago Type 2 diabetes  mellitus with hyperglycemia, without long-term current use of insulin ([place])   [facility], Lupita Raider, FNP   2 months ago Skin infection   [facility], Lupita Raider, FNP   4 months ago Anxiety and depression   [facility], Lupita Raider, FNP   4 months ago Anxiety and depression   [facility], Lupita Raider, FNP   7 months ago Type 2 diabetes mellitus with hyperglycemia, without long-term current use of insulin [facility])   [facility], Lupita Raider, [place]

## 2019-12-28 ENCOUNTER — Encounter: Payer: Self-pay | Admitting: Family Medicine

## 2020-01-24 ENCOUNTER — Other Ambulatory Visit: Payer: Self-pay | Admitting: Family Medicine

## 2020-01-24 DIAGNOSIS — E1165 Type 2 diabetes mellitus with hyperglycemia: Secondary | ICD-10-CM

## 2020-01-29 ENCOUNTER — Encounter: Payer: Self-pay | Admitting: Family Medicine

## 2020-02-01 ENCOUNTER — Other Ambulatory Visit: Payer: Self-pay | Admitting: Family Medicine

## 2020-02-01 DIAGNOSIS — F419 Anxiety disorder, unspecified: Secondary | ICD-10-CM

## 2020-02-01 DIAGNOSIS — F32A Depression, unspecified: Secondary | ICD-10-CM

## 2020-02-01 NOTE — Telephone Encounter (Signed)
Requested medications are due for refill today yes  Requested medications are on the active medication list yes  Last refill 9/9  Last visit 10/2019  Future visit scheduled 04/2020  Notes to clinic OV in Aug states to return in 3 months ((Nov), upcoming appt is scheduled for 7 months later.

## 2020-02-17 ENCOUNTER — Ambulatory Visit: Payer: Medicare HMO | Attending: Internal Medicine

## 2020-02-17 DIAGNOSIS — Z23 Encounter for immunization: Secondary | ICD-10-CM

## 2020-02-17 NOTE — Progress Notes (Signed)
   Covid-19 Vaccination Clinic  Name:  Maria Sloan    MRN: [id number] DOB: 06-08-1950  02/17/2020  [name] was observed post Covid-19 immunization for 15 minutes without incident. She was provided with Vaccine Information Sheet and instruction to access the V-Safe system.   [name] was instructed to call 911 with any severe reactions post vaccine: Marland Kitchen Difficulty breathing  . Swelling of face and throat  . A fast heartbeat  . A bad rash all over body  . Dizziness and weakness   Immunizations Administered    Name Date Dose VIS Date [place] COVID-19 Vaccine 02/17/2020  9:40 AM 0.3 mL 12/20/2019 Intramuscular   Manufacturer: [place]   Lot: [id number]   [place]: [address]-7

## 2020-03-10 ENCOUNTER — Other Ambulatory Visit: Payer: Self-pay | Admitting: Family Medicine

## 2020-03-10 DIAGNOSIS — F32A Depression, unspecified: Secondary | ICD-10-CM

## 2020-03-10 NOTE — Telephone Encounter (Signed)
Requested Prescriptions  Pending Prescriptions Disp Refills  . citalopram (CELEXA) 20 MG tablet [Pharmacy Med Name: CITALOPRAM HBR 20 MG TABLET] 90 tablet 1    Sig: TAKE 1 TABLET BY MOUTH EVERY DAY     Psychiatry:  Antidepressants - SSRI Passed - 03/10/2020  8:30 AM      Passed - Completed PHQ-2 or PHQ-9 in the last 360 days      Passed - Valid encounter within last 6 months    Recent Outpatient Visits          4 months ago Type 2 diabetes mellitus with hyperglycemia, without long-term current use of insulin [facility])   [place], FNP   5 months ago Skin infection   [facility], Lupita Raider, FNP   7 months ago Anxiety and depression   [facility], Lupita Raider, FNP   7 months ago Anxiety and depression   [facility], Lupita Raider, FNP   10 months ago Type 2 diabetes mellitus with hyperglycemia, without long-term current use of insulin [facility])   [facility], Lupita Raider, [place]

## 2020-03-11 ENCOUNTER — Ambulatory Visit (INDEPENDENT_AMBULATORY_CARE_PROVIDER_SITE_OTHER): Payer: Medicare HMO | Admitting: Family Medicine

## 2020-03-11 ENCOUNTER — Other Ambulatory Visit: Payer: Self-pay

## 2020-03-11 ENCOUNTER — Encounter: Payer: Self-pay | Admitting: Family Medicine

## 2020-03-11 VITALS — BP 119/58 | HR 64 | Temp 97.5°F | Ht 65.0 in | Wt 187.0 lb

## 2020-03-11 DIAGNOSIS — R300 Dysuria: Secondary | ICD-10-CM | POA: Diagnosis not present

## 2020-03-11 LAB — POCT URINALYSIS DIPSTICK
Bilirubin, UA: NEGATIVE
Glucose, UA: POSITIVE — AB
Ketones, UA: NEGATIVE
Nitrite, UA: NEGATIVE
Protein, UA: POSITIVE — AB
Spec Grav, UA: 1.01 (ref 1.010–1.025)
Urobilinogen, UA: 0.2 E.U./dL
pH, UA: 6 (ref 5.0–8.0)

## 2020-03-11 MED ORDER — SULFAMETHOXAZOLE-TRIMETHOPRIM 800-160 MG PO TABS: 1.0000 | ORAL_TABLET | Freq: Two times a day (BID) | ORAL | 0 refills | Status: AC

## 2020-03-11 MED ORDER — PHENAZOPYRIDINE HCL 100 MG PO TABS: 100.0000 mg | ORAL_TABLET | Freq: Three times a day (TID) | ORAL | 0 refills | Status: AC | PRN

## 2020-03-11 NOTE — Progress Notes (Signed)
Subjective:    Patient ID: Maria Sloan, female    DOB: October 31, 1950, 70 y.o.   MRN: [id number]  Maria Sloan is a 70 y.o. female presenting on 03/11/2020 for Dysuria (Urinary hesitancy, urine frequency, urgency and gross hematuria x 4 days. Pt state a week ago her symptoms started off with vaginal itching. She used OTC yeast infection medication and notice some improvement, but then developed urinary issues. )   HPI  Maria Sloan presents to clinic with concerns for dysuria, urinary hesitancy, frequency, urgency and hematuria x 4 days.  Denies any fevers, abdominal pain, n/v/d, lower back pain, chills/shakes.  Has not taken anything for her symptoms, denies recent UTI.  Depression screen [facility] 2/9 08/02/2019 07/19/2019 05/23/2019  Decreased Interest 0 1 0  Down, Depressed, Hopeless 0 1 1  PHQ - 2 Score 0 2 1  Altered sleeping 0 1 1  Tired, decreased energy 0 1 0  Change in appetite 0 0 0  Feeling bad or failure about yourself  0 2 0  Trouble concentrating 0 0 0  Moving slowly or fidgety/restless 0 0 0  Suicidal thoughts 0 0 0  PHQ-9 Score 0 6 2  Difficult doing work/chores Not difficult at all Very difficult Somewhat difficult    Social History   Tobacco Use  . Smoking status: Former Smoker    Packs/day: 1.00    Years: 25.00    Pack years: 25.00    Types: Cigarettes    Quit date: 11/29/1994    Years since quitting: 25.2  . Smokeless tobacco: Never Used  Vaping Use  . Vaping Use: Never used  Substance Use Topics  . Alcohol use: Yes    Alcohol/week: 0.0 standard drinks    Comment: occasionally  . Drug use: Never    Review of Systems  Constitutional: Negative.   HENT: Negative.   Eyes: Negative.   Respiratory: Negative.   Cardiovascular: Negative.   Gastrointestinal: Negative.   Endocrine: Negative.   Genitourinary: Positive for dysuria, frequency, hematuria and urgency. Negative for decreased urine volume, difficulty urinating, dyspareunia, enuresis, flank pain, genital  sores, menstrual problem, pelvic pain, vaginal bleeding, vaginal discharge and vaginal pain.  Musculoskeletal: Negative.   Skin: Negative.   Allergic/Immunologic: Negative.   Neurological: Negative.   Hematological: Negative.   Psychiatric/Behavioral: Negative.    Per HPI unless specifically indicated above     Objective:    BP (!) 119/58 (BP Location: Right Arm, Patient Position: Sitting, Cuff Size: Normal)   Pulse 64   Temp (!) 97.5 F (36.4 C) (Temporal)   Ht 5\' 5"  (1.651 m)   Wt 187 lb (84.8 kg)   BMI 31.12 kg/m   Wt Readings from Last 3 Encounters:  03/11/20 187 lb (84.8 kg)  10/23/19 182 lb (82.6 kg)  09/13/19 183 lb 6.4 oz (83.2 kg)    Physical Exam Vitals and nursing note reviewed.  Constitutional:      General: She is not in acute distress.    Appearance: Normal appearance. She is well-developed and well-groomed. She is obese. She is not ill-appearing or toxic-appearing.  HENT:     Head: Normocephalic and atraumatic.     Nose:     Comments: [name] is in place, covering mouth and nose. Eyes:     General: Lids are normal. Vision grossly intact.        Right eye: No discharge.        Left eye: No discharge.     Extraocular Movements: Extraocular movements  intact.     Conjunctiva/sclera: Conjunctivae normal.     Pupils: Pupils are equal, round, and reactive to light.  Cardiovascular:     Rate and Rhythm: Normal rate and regular rhythm.     Pulses: Normal pulses.     Heart sounds: Normal heart sounds. No murmur heard. No friction rub. No gallop.   Pulmonary:     Effort: Pulmonary effort is normal. No respiratory distress.     Breath sounds: Normal breath sounds.  Abdominal:     General: Bowel sounds are normal. There is no distension.     Palpations: Abdomen is soft.     Tenderness: There is no abdominal tenderness. There is no right CVA tenderness, left CVA tenderness or guarding.  Skin:    General: Skin is warm and dry.     Capillary Refill: Capillary  refill takes less than 2 seconds.  Neurological:     General: No focal deficit present.     Mental Status: She is alert and oriented to person, place, and time.  Psychiatric:        Attention and Perception: Attention and perception normal.        Mood and Affect: Mood and affect normal.        Speech: Speech normal.        Behavior: Behavior normal. Behavior is cooperative.        Thought Content: Thought content normal.        Cognition and Memory: Cognition and memory normal.        Judgment: Judgment normal.    Results for orders placed or performed in visit on 03/11/20  POCT Urinalysis Dipstick  Result Value Ref Range   Color, UA dark yellow    Clarity, UA cloudy    Glucose, UA Positive (A) Negative   Bilirubin, UA negative    Ketones, UA negative    Spec Grav, UA 1.010 1.010 - 1.025   Blood, UA large    pH, UA 6.0 5.0 - 8.0   Protein, UA Positive (A) Negative   Urobilinogen, UA 0.2 0.2 or 1.0 E.U./dL   Nitrite, UA negative    Leukocytes, UA Large (3+) (A) Negative   Appearance     Odor        Assessment & Plan:   Problem List Items Addressed This Visit      Other   Dysuria - Primary    Likely UTI based on POCT U/A, will send for culture and treat empirically with bactrim DS 1 tablet BID x 5 days and pyridium 100mg  TID PRN for dysuria.  To RTC PRN      Relevant Medications   sulfamethoxazole-trimethoprim (BACTRIM DS) 800-160 MG tablet   phenazopyridine (PYRIDIUM) 100 MG tablet   Other Relevant Orders   POCT Urinalysis Dipstick (Completed)   Urine Culture      Meds ordered this encounter  Medications  . sulfamethoxazole-trimethoprim (BACTRIM DS) 800-160 MG tablet    Sig: Take 1 tablet by mouth 2 (two) times daily for 5 days.    Dispense:  10 tablet    Refill:  0  . phenazopyridine (PYRIDIUM) 100 MG tablet    Sig: Take 1 tablet (100 mg total) by mouth 3 (three) times daily as needed for up to 2 days for pain.    Dispense:  6 tablet    Refill:  0    Follow up plan: Return if symptoms worsen or fail to improve.   Harlin Rain, FNP Family Nurse  Practitioner [facility] 03/11/2020, 12:08 PM

## 2020-03-11 NOTE — Assessment & Plan Note (Signed)
Likely UTI based on POCT U/A, will send for culture and treat empirically with bactrim DS 1 tablet BID x 5 days and pyridium 100mg  TID PRN for dysuria.  To RTC PRN

## 2020-03-11 NOTE — Patient Instructions (Signed)
We have sent your urine to the lab for culture and we will contact you when we receive the results.  I have sent in a prescription for Bactrim DS to take 1 tablet 2x per day for the next 5 days.  Remember to always wipe front to back and empty your bladder pre- and post-intercourse to prevent UTIs.    Drink plenty of fluids.  I have sent in a prescription for Pyridium.    As we discussed. this will change the color of your urine to an orange/red, this is normal.    You may also experience some urinary dribbling, be sure to wear a pantyliner to protect your clothing from staining.  We will plan to see you back if your symptoms worsen or fail to improve  You will receive a survey after today's visit either digitally by e-mail or paper by USPS mail. Your experiences and feedback matter to [place].  Please respond so we know how we are doing as we provide care for you.  Call us with any questions/concerns/needs.  It is my goal to be available to you for your health concerns.  Thanks for choosing me to be a partner in your healthcare needs!  Harlin Rain, FNP-C Family Nurse Practitioner [facility] Phone: [phone]

## 2020-03-12 LAB — URINE CULTURE
MICRO NUMBER:: 11400063
SPECIMEN QUALITY:: ADEQUATE

## 2020-04-26 ENCOUNTER — Other Ambulatory Visit: Payer: Self-pay

## 2020-04-26 DIAGNOSIS — R809 Proteinuria, unspecified: Secondary | ICD-10-CM

## 2020-04-26 DIAGNOSIS — E782 Mixed hyperlipidemia: Secondary | ICD-10-CM

## 2020-04-26 DIAGNOSIS — E1129 Type 2 diabetes mellitus with other diabetic kidney complication: Secondary | ICD-10-CM

## 2020-04-26 DIAGNOSIS — E1165 Type 2 diabetes mellitus with hyperglycemia: Secondary | ICD-10-CM

## 2020-04-26 MED ORDER — EZETIMIBE 10 MG PO TABS: 10.0000 mg | ORAL_TABLET | Freq: Every day | ORAL | 1 refills | Status: DC

## 2020-04-26 MED ORDER — LISINOPRIL 5 MG PO TABS: 5.0000 mg | ORAL_TABLET | Freq: Every day | ORAL | 3 refills | Status: DC

## 2020-05-28 ENCOUNTER — Ambulatory Visit (INDEPENDENT_AMBULATORY_CARE_PROVIDER_SITE_OTHER): Payer: Medicare HMO

## 2020-05-28 VITALS — Ht 65.0 in | Wt 184.0 lb

## 2020-05-28 DIAGNOSIS — Z0000 Encounter for general adult medical examination without abnormal findings: Secondary | ICD-10-CM

## 2020-05-28 NOTE — Patient Instructions (Signed)
Maria Sloan , Thank you for taking time to come for your Medicare Wellness Visit. I appreciate your ongoing commitment to your health goals. Please review the following plan we discussed and let me know if I can assist you in the future.   Screening recommendations/referrals: Colonoscopy: cologuard 04/30/2019, due 04/29/2022 Mammogram: completed 09/27/2019 Bone Density: completed 04/24/2015 Recommended yearly ophthalmology/optometry visit for glaucoma screening and checkup Recommended yearly dental visit for hygiene and checkup  Vaccinations: Influenza vaccine: completed per patient Pneumococcal vaccine: completed 09/13/2019 Tdap vaccine: completed 09/13/2019, due 09/12/2029 Shingles vaccine: discussed   Covid-19: 02/17/2020, 06/12/2019, 05/12/2019  Advanced directives: Advance directive discussed with you today.   Conditions/risks identified: none  Next appointment: Follow up in one year for your annual wellness visit    Preventive Care 65 Years and Older, Female Preventive care refers to lifestyle choices and visits with your health care provider that can promote health and wellness. What does preventive care include?  A yearly physical exam. This is also called an annual well check.  Dental exams once or twice a year.  Routine eye exams. Ask your health care provider how often you should have your eyes checked.  Personal lifestyle choices, including:  Daily care of your teeth and gums.  Regular physical activity.  Eating a healthy diet.  Avoiding tobacco and drug use.  Limiting alcohol use.  Practicing safe sex.  Taking low-dose aspirin every day.  Taking vitamin and mineral supplements as recommended by your health care provider. What happens during an annual well check? The services and screenings done by your health care provider during your annual well check will depend on your age, overall health, lifestyle risk factors, and family history of disease. Counseling   Your health care provider may ask you questions about your:  Alcohol use.  Tobacco use.  Drug use.  Emotional well-being.  Home and relationship well-being.  Sexual activity.  Eating habits.  History of falls.  Memory and ability to understand (cognition).  Work and work Statistician.  Reproductive health. Screening  You may have the following tests or measurements:  Height, weight, and BMI.  Blood pressure.  Lipid and cholesterol levels. These may be checked every 5 years, or more frequently if you are over 25 years old.  Skin check.  Lung cancer screening. You may have this screening every year starting at age 59 if you have a 30-pack-year history of smoking and currently smoke or have quit within the past 15 years.  Fecal occult blood test (FOBT) of the stool. You may have this test every year starting at age 78.  Flexible sigmoidoscopy or colonoscopy. You may have a sigmoidoscopy every 5 years or a colonoscopy every 10 years starting at age 64.  Hepatitis C blood test.  Hepatitis B blood test.  Sexually transmitted disease (STD) testing.  Diabetes screening. This is done by checking your blood sugar (glucose) after you have not eaten for a while (fasting). You may have this done every 1-3 years.  Bone density scan. This is done to screen for osteoporosis. You may have this done starting at age 65.  Mammogram. This may be done every 1-2 years. Talk to your health care provider about how often you should have regular mammograms. Talk with your health care provider about your test results, treatment options, and if necessary, the need for more tests. Vaccines  Your health care provider may recommend certain vaccines, such as:  Influenza vaccine. This is recommended every year.  Tetanus, diphtheria, and  acellular pertussis (Tdap, Td) vaccine. You may need a Td booster every 10 years.  Zoster vaccine. You may need this after age 73.  Pneumococcal 13-valent  conjugate (PCV13) vaccine. One dose is recommended after age 101.  Pneumococcal polysaccharide (PPSV23) vaccine. One dose is recommended after age 72. Talk to your health care provider about which screenings and vaccines you need and how often you need them. This information is not intended to replace advice given to you by your health care provider. Make sure you discuss any questions you have with your health care provider. Document Released: 03/15/2015 Document Revised: 11/06/2015 Document Reviewed: 12/18/2014 Elsevier Interactive Patient Education  2017 [place] Prevention in the Home Falls can cause injuries. They can happen to people of all ages. There are many things you can do to make your home safe and to help prevent falls. What can I do on the outside of my home?  Regularly fix the edges of walkways and driveways and fix any cracks.  Remove anything that might make you trip as you walk through a door, such as a raised step or threshold.  Trim any bushes or trees on the path to your home.  Use bright outdoor lighting.  Clear any walking paths of anything that might make someone trip, such as rocks or tools.  Regularly check to see if handrails are loose or broken. Make sure that both sides of any steps have handrails.  Any raised decks and porches should have guardrails on the edges.  Have any leaves, snow, or ice cleared regularly.  Use sand or salt on walking paths during winter.  Clean up any spills in your garage right away. This includes oil or grease spills. What can I do in the bathroom?  Use night lights.  Install grab bars by the toilet and in the tub and shower. Do not use towel bars as grab bars.  Use non-skid mats or decals in the tub or shower.  If you need to sit down in the shower, use a plastic, non-slip stool.  Keep the floor dry. Clean up any water that spills on the floor as soon as it happens.  Remove soap buildup in the tub or  shower regularly.  Attach bath mats securely with double-sided non-slip rug tape.  Do not have throw rugs and other things on the floor that can make you trip. What can I do in the bedroom?  Use night lights.  Make sure that you have a light by your bed that is easy to reach.  Do not use any sheets or blankets that are too big for your bed. They should not hang down onto the floor.  Have a firm chair that has side arms. You can use this for support while you get dressed.  Do not have throw rugs and other things on the floor that can make you trip. What can I do in the kitchen?  Clean up any spills right away.  Avoid walking on wet floors.  Keep items that you use a lot in easy-to-reach places.  If you need to reach something above you, use a strong step stool that has a grab bar.  Keep electrical cords out of the way.  Do not use floor polish or wax that makes floors slippery. If you must use wax, use non-skid floor wax.  Do not have throw rugs and other things on the floor that can make you trip. What can I do with my stairs?  Do not leave any items on the stairs.  Make sure that there are handrails on both sides of the stairs and use them. Fix handrails that are broken or loose. Make sure that handrails are as long as the stairways.  Check any carpeting to make sure that it is firmly attached to the stairs. Fix any carpet that is loose or worn.  Avoid having throw rugs at the top or bottom of the stairs. If you do have throw rugs, attach them to the floor with carpet tape.  Make sure that you have a light switch at the top of the stairs and the bottom of the stairs. If you do not have them, ask someone to add them for you. What else can I do to help prevent falls?  Wear shoes that:  Do not have high heels.  Have rubber bottoms.  Are comfortable and fit you well.  Are closed at the toe. Do not wear sandals.  If you use a stepladder:  Make sure that it is fully  opened. Do not climb a closed stepladder.  Make sure that both sides of the stepladder are locked into place.  Ask someone to hold it for you, if possible.  Clearly mark and make sure that you can see:  Any grab bars or handrails.  First and last steps.  Where the edge of each step is.  Use tools that help you move around (mobility aids) if they are needed. These include:  Canes.  Walkers.  Scooters.  Crutches.  Turn on the lights when you go into a dark area. Replace any light bulbs as soon as they burn out.  Set up your furniture so you have a clear path. Avoid moving your furniture around.  If any of your floors are uneven, fix them.  If there are any pets around you, be aware of where they are.  Review your medicines with your doctor. Some medicines can make you feel dizzy. This can increase your chance of falling. Ask your doctor what other things that you can do to help prevent falls. This information is not intended to replace advice given to you by your health care provider. Make sure you discuss any questions you have with your health care provider. Document Released: 12/13/2008 Document Revised: 07/25/2015 Document Reviewed: 03/23/2014 Elsevier Interactive Patient Education  2017 [employer].

## 2020-05-28 NOTE — Progress Notes (Signed)
I connected with Dulcey Riederer today by telephone and verified that I am speaking with the correct person using two identifiers. Location patient: home Location provider: work Persons participating in the virtual visit: Ahliyah, [place] LPN.   I discussed the limitations, risks, security and privacy concerns of performing an evaluation and management service by telephone and the availability of in person appointments. I also discussed with the patient that there may be a patient responsible charge related to this service. The patient expressed understanding and verbally consented to this telephonic visit.    Interactive audio and video telecommunications were attempted between this provider and patient, however failed, due to patient having technical difficulties OR patient did not have access to video capability.  We continued and completed visit with audio only.     Vital signs may be patient reported or missing.  Subjective:   Maria Sloan is a 70 y.o. female who presents for Medicare Annual (Subsequent) preventive examination.  Review of Systems     Cardiac Risk Factors include: advanced age (>33mn, >>21women);diabetes mellitus;dyslipidemia;obesity (BMI >30kg/m2)     Objective:    Today's Vitals   05/28/20 0936  Weight: 184 lb (83.5 kg)  Height: '5\' 5"'  (1.651 m)   Body mass index is 30.62 kg/m.  Advanced Directives 05/28/2020 05/23/2019 04/18/2018 02/07/2015  Does Patient Have a Medical Advance Directive? No No - No  Would patient like information on creating a medical advance directive? - - No - Patient declined -    Current Medications (verified) Outpatient Encounter Medications as of 05/28/2020  Medication Sig  . ACCU-CHEK AVIVA PLUS test strip USE UP TO ONE TIME DAILY AS DIRECTED. FOR ICD-10 E10.9, E11.9 A1C 02/11/2018- 11.8%  . Accu-Chek Softclix Lancets lancets ONE TIME DAILY AS DIRECTED. FOR ICD-10 E10.9, E11.9 A1C 02/11/2018- 11.8%  . atorvastatin (LIPITOR)  20 MG tablet TAKE 1 TABLET BY MOUTH EVERY DAY  . Blood Glucose Monitoring Suppl (ACCU-CHEK AVIVA PLUS) w/Device KIT 1 Device by Does not apply route as directed. . One time daily as directed. FOR ICD-10 E10.9, E11.9 A1C 02/11/2018- 11.8%  . busPIRone (BUSPAR) 5 MG tablet TAKE 1 TABLET BY MOUTH EVERYDAY AT BEDTIME  . CALCIUM CITRATE PO Take 1 tablet by mouth daily.   . Cholecalciferol (VITAMIN D PO) Take 1 tablet by mouth daily.   . citalopram (CELEXA) 20 MG tablet TAKE 1 TABLET BY MOUTH EVERY DAY  . diclofenac Sodium (VOLTAREN) 1 % GEL Apply 2 g topically 4 (four) times daily.  .Marland Kitchenezetimibe (ZETIA) 10 MG tablet Take 1 tablet (10 mg total) by mouth daily.  . fluticasone (FLONASE) 50 MCG/ACT nasal spray PLACE 2 SPRAYS INTO BOTH NOSTRILS DAILY. USE FOR 4-6 WEEKS THEN STOP AND USE AS NEEDED  . glimepiride (AMARYL) 2 MG tablet TAKE 1 TABLET BY MOUTH EVERY DAY WITH BREAKFAST  . lisinopril (ZESTRIL) 5 MG tablet Take 1 tablet (5 mg total) by mouth daily.  . meloxicam (MOBIC) 15 MG tablet Take 15 mg by mouth daily.   . metFORMIN (GLUCOPHAGE) 1000 MG tablet TAKE 1 TABLET BY MOUTH TWICE A DAY WITH MEALS (Patient taking differently: Take 1,000 mg by mouth. Pt currently taking 1,000MG in the morning, 500MG at bedtime)  . methocarbamol (ROBAXIN) 500 MG tablet Take 500 mg by mouth at bedtime.   . Multiple Vitamin (MULTIVITAMIN) tablet Take 1 tablet by mouth daily.  . Omega-3 Fatty Acids (FISH OIL) 1000 MG CAPS Take 1,000 mg by mouth 3 (three) times daily.  .Marland Kitchen  Potassium 75 MG TABS Take 1 tablet by mouth daily.   No facility-administered encounter medications on file as of 05/28/2020.    Allergies (verified) Prochlorperazine, Other, Actifed cold-allergy [chlorpheniramine-phenylephrine], and Penicillins   History: Past Medical History:  Diagnosis Date  . Anxiety   . Arthritis   . Cervical cancer ([place])   . Hyperlipidemia   . PVD (peripheral vascular disease) (H[facility])    R leg venous insufficiency  . PVD  (peripheral vascular disease) ([place])   . Shingles   . Type 2 diabetes mellitus ([place])    Past Surgical History:  Procedure Laterality Date  . PARTIAL HYSTERECTOMY     ovaries remain  . ROTATOR CUFF REPAIR Right    Family History  Problem Relation Age of Onset  . Emphysema Father   . Lung cancer Father   . Hyperlipidemia Father   . Stroke Mother   . Hyperlipidemia Sister   . Healthy Son    Social History   Socioeconomic History  . Marital status: Divorced    Spouse name: Not on file  . Number of children: Not on file  . Years of education: Not on file  . Highest education level: Bachelor's degree (e.g., BA, AB, BS)  Occupational History  . Occupation: retired   Tobacco Use  . Smoking status: Former Smoker    Packs/day: 1.00    Years: 25.00    Pack years: 25.00    Types: Cigarettes    Quit date: 11/29/1994    Years since quitting: 25.5  . Smokeless tobacco: Never Used  Vaping Use  . Vaping Use: Never used  Substance and Sexual Activity  . Alcohol use: Yes    Alcohol/week: 0.0 standard drinks    Comment: occasionally  . Drug use: Never  . Sexual activity: Not Currently    Birth control/protection: None  Other Topics Concern  . Not on file  Social History Narrative   Divorced.   1 son.    Moved from [place].   Enjoys making jewelry, spending time with family, crafts, reading.   Social Determinants of Health   Financial Resource Strain: Low Risk   . Difficulty of Paying Living Expenses: Not hard at all  Food Insecurity: No Food Insecurity  . Worried About Charity fundraiser in the Last Year: Never true  . Ran Out of Food in the Last Year: Never true  Transportation Needs: No Transportation Needs  . Lack of Transportation (Medical): No  . Lack of Transportation (Non-Medical): No  Physical Activity: Insufficiently Active  . Days of Exercise per Week: 3 days  . Minutes of Exercise per Session: 20 min  Stress: No Stress Concern Present  . Feeling of Stress :  Not at all  Social Connections: Not on file    Tobacco Counseling Counseling given: Not Answered   Clinical Intake:  Pre-visit preparation completed: Yes  Pain : No/denies pain     Nutritional Status: BMI > 30  Obese Nutritional Risks: Nausea/ vomitting/ diarrhea (diarrhea from metformin) Diabetes: Yes  How often do you need to have someone help you when you read instructions, pamphlets, or other written materials from your doctor or pharmacy?: 1 - Never What is the last grade level you completed in school?: college  Diabetic? Yes Nutrition Risk Assessment:  Has the patient had any N/V/D within the last 2 months?  No  Does the patient have any non-healing wounds?  No  Has the patient had any unintentional weight loss or weight gain?  No   Diabetes:  Is the patient diabetic?  Yes  If diabetic, was a CBG obtained today?  No  Did the patient bring in their glucometer from home?  No  How often do you monitor your CBG's? 3 times weekly.   Financial Strains and Diabetes Management:  Are you having any financial strains with the device, your supplies or your medication? No .  Does the patient want to be seen by Chronic Care Management for management of their diabetes?  No  Would the patient like to be referred to a Nutritionist or for Diabetic Management?  No   Diabetic Exams:  Diabetic Eye Exam: Completed 08/25/2019 Diabetic Foot Exam: Completed 10/23/2019  Interpreter Needed?: No  Information entered by :: NAllen LPN   Activities of Daily Living In your present state of health, do you have any difficulty performing the following activities: 05/28/2020  Hearing? N  Vision? N  Difficulty concentrating or making decisions? Y  Comment trouble with words  Walking or climbing stairs? N  Dressing or bathing? N  Doing errands, shopping? N  Preparing Food and eating ? N  Using the Toilet? N  In the past six months, have you accidently leaked urine? Y  Comment with  coughing and sneezing  Do you have problems with loss of bowel control? N  Managing your Medications? N  Managing your Finances? N  Housekeeping or managing your Housekeeping? N  Some recent data might be hidden    Patient Care Team: Malfi, Lupita Raider, FNP as PCP - General (Family Medicine)  Indicate any recent Medical Services you may have received from other than [facility] providers in the past year (date may be approximate).     Assessment:   This is a routine wellness examination for Brandalyn.  Hearing/Vision screen No exam data present  Dietary issues and exercise activities discussed: Current Exercise Habits: Home exercise routine, Type of exercise: walking, Time (Minutes): 20, Frequency (Times/Week): 3, Weekly Exercise (Minutes/Week): 60  Goals    . Patient Stated     05/28/2020, wants to weigh 155 pounds      Depression Screen PHQ 2/9 Scores 05/28/2020 08/02/2019 07/19/2019 05/23/2019 04/24/2019 08/16/2018 05/11/2018  PHQ - 2 Score 0 0 '2 1 1 1 ' 0  PHQ- 9 Score - 0 '6 2 2 2 1    ' Fall Risk Fall Risk  05/28/2020 05/23/2019 08/16/2018 05/26/2018 05/19/2018  Falls in the past year? 0 0 0 (No Data) 1  Comment - - - no falls since previous visit -  Number falls in past yr: - 0 - - 0  Injury with Fall? - 0 - - 0  Risk for fall due to : Medication side effect - - - -  Follow up Falls evaluation completed;Education provided;Falls prevention discussed - Falls evaluation completed - -    FALL RISK PREVENTION PERTAINING TO THE HOME:  Any stairs in or around the home? Yes  If so, are there any without handrails? No  Home free of loose throw rugs in walkways, pet beds, electrical cords, etc? Yes  Adequate lighting in your home to reduce risk of falls? Yes   ASSISTIVE DEVICES UTILIZED TO PREVENT FALLS:  Life alert? No  Use of a cane, walker or w/c? No  Grab bars in the bathroom? No  Shower chair or bench in shower? No  Elevated toilet seat or a handicapped toilet? No   TIMED UP AND  GO:  Was the test performed? No   Cognitive Function:  6CIT Screen 05/28/2020  What Year? 0 points  What month? 0 points  What time? 0 points  Count back from 20 0 points  Months in reverse 0 points  Repeat phrase 0 points  Total Score 0    Immunizations Immunization History  Administered Date(s) Administered  . Influenza, High Dose Seasonal PF 12/17/2018  . Influenza,inj,Quad PF,6+ Mos 12/13/2015  . Influenza-Unspecified 12/10/2014, 01/07/2018, 01/30/2019  . PFIZER(Purple Top)SARS-COV-2 Vaccination 05/12/2019, 06/12/2019, 02/17/2020  . Pneumococcal Conjugate-13 01/07/2018  . Pneumococcal Polysaccharide-23 09/13/2019  . Tdap 04/05/2015, 09/13/2019    TDAP status: Up to date  Flu Vaccine status: Up to date  Pneumococcal vaccine status: Up to date  Covid-19 vaccine status: Completed vaccines  Qualifies for Shingles Vaccine? Yes   Zostavax completed No   Shingrix Completed?: No.    Education has been provided regarding the importance of this vaccine. Patient has been advised to call insurance company to determine out of pocket expense if they have not yet received this vaccine. Advised may also receive vaccine at local pharmacy or Health Dept. Verbalized acceptance and understanding.  Screening Tests Health Maintenance  Topic Date Due  . INFLUENZA VACCINE  10/01/2019  . HEMOGLOBIN A1C  04/24/2020  . OPHTHALMOLOGY EXAM  08/23/2020  . FOOT EXAM  10/22/2020  . MAMMOGRAM  09/26/2021  . Fecal DNA (Cologuard)  04/29/2022  . TETANUS/TDAP  09/12/2029  . DEXA SCAN  Completed  . COVID-19 Vaccine  Completed  . Hepatitis C Screening  Completed  . PNA vac Low Risk Adult  Completed  . HPV VACCINES  Aged Out    Health Maintenance  Health Maintenance Due  Topic Date Due  . INFLUENZA VACCINE  10/01/2019  . HEMOGLOBIN A1C  04/24/2020    Colorectal cancer screening: Type of screening: Cologuard. Completed 04/30/2019. Repeat every 3 years  Mammogram status: Completed  09/27/2019. Repeat every year  Bone Density status: Completed 04/24/2015.  Lung Cancer Screening: (Low Dose CT Chest recommended if Age 20-80 years, 30 pack-year currently smoking OR have quit w/in 15years.) does not qualify.   Lung Cancer Screening Referral: no  Additional Screening:  Hepatitis C Screening: does qualify; Completed 12/13/2015  Vision Screening: Recommended annual ophthalmology exams for early detection of glaucoma and other disorders of the eye. Is the patient up to date with their annual eye exam?  Yes  Who is the provider or what is the name of the office in which the patient attends annual eye exams? [facility] If pt is not established with a provider, would they like to be referred to a provider to establish care? No .   Dental Screening: Recommended annual dental exams for proper oral hygiene  Community Resource Referral / Chronic Care Management: CRR required this visit?  No   CCM required this visit?  No      Plan:     I have personally reviewed and noted the following in the patient's chart:   . Medical and social history . Use of alcohol, tobacco or illicit drugs  . Current medications and supplements . Functional ability and status . Nutritional status . Physical activity . Advanced directives . List of other physicians . Hospitalizations, surgeries, and ER visits in previous 12 months . Vitals . Screenings to include cognitive, depression, and falls . Referrals and appointments  In addition, I have reviewed and discussed with patient certain preventive protocols, quality metrics, and best practice recommendations. A written personalized care plan for preventive services as well as general preventive  health recommendations were provided to patient.     Kellie Simmering, LPN   09/17/3670   Nurse Notes:

## 2020-06-02 ENCOUNTER — Other Ambulatory Visit: Payer: Self-pay | Admitting: Family Medicine

## 2020-06-02 DIAGNOSIS — E1165 Type 2 diabetes mellitus with hyperglycemia: Secondary | ICD-10-CM

## 2020-06-02 NOTE — Telephone Encounter (Signed)
Requested medication (s) are due for refill today: yes  Requested medication (s) are on the active medication list: yes  Last refill:  09/05/19  Future visit scheduled: no  Notes to clinic:  overdue lab work   Requested Prescriptions  Pending Prescriptions Disp Refills   metFORMIN (GLUCOPHAGE) 1000 MG tablet [Pharmacy Med Name: METFORMIN HCL 1,000 MG TABLET] 180 tablet 1    Sig: TAKE 1 TABLET BY MOUTH TWICE A DAY WITH MEALS      Endocrinology:  Diabetes - Biguanides Failed - 06/02/2020 10:01 AM      Failed - Cr in normal range and within 360 days    Creat  Date Value Ref Range Status  05/11/2019 0.72 0.50 - 0.99 mg/dL Final    Comment:    For patients >55 years of age, the reference limit for Creatinine is approximately 13% higher for people identified as African-American. .    Creatinine,U  Date Value Ref Range Status  12/13/2015 153.6 mg/dL Final          Failed - HBA1C is between 0 and 7.9 and within 180 days    Hemoglobin A1C  Date Value Ref Range Status  10/23/2019 7.1 (A) 4.0 - 5.6 % Final   Hgb A1c MFr Bld  Date Value Ref Range Status  08/15/2018 6.1 (H) <5.7 % of total Hgb Final    Comment:    For someone without known diabetes, a hemoglobin  A1c value between 5.7% and 6.4% is consistent with prediabetes and should be confirmed with a  follow-up test. . For someone with known diabetes, a value <7% indicates that their diabetes is well controlled. A1c targets should be individualized based on duration of diabetes, age, comorbid conditions, and other considerations. . This assay result is consistent with an increased risk of diabetes. . Currently, no consensus exists regarding use of hemoglobin A1c for diagnosis of diabetes for children. .           Failed - eGFR in normal range and within 360 days    GFR, Est African American  Date Value Ref Range Status  02/11/2018 109 > OR = 60 mL/min/1.3m Final   GFR, Est Non African American  Date Value  Ref Range Status  02/11/2018 94 > OR = 60 mL/min/1.761mFinal   GFR  Date Value Ref Range Status  07/12/2015 87.75 >60.00 mL/min Final          Passed - Valid encounter within last 6 months    Recent Outpatient Visits           2 months ago [facility]   7 months ago Type 2 diabetes mellitus with hyperglycemia, without long-term [facility] use [facility]  [facility]   8 months ago [facility] infection   [facility]   10 months ago Anxiety and depression   [facility]   10 months ago Anxiety and depression   [facility]       Future Appointments             [facility]

## 2020-06-04 ENCOUNTER — Other Ambulatory Visit: Payer: Self-pay

## 2020-06-04 ENCOUNTER — Encounter: Payer: Self-pay | Admitting: Unknown Physician Specialty

## 2020-06-04 ENCOUNTER — Ambulatory Visit (INDEPENDENT_AMBULATORY_CARE_PROVIDER_SITE_OTHER): Payer: Medicare HMO | Admitting: Unknown Physician Specialty

## 2020-06-04 VITALS — BP 107/57 | HR 64 | Temp 95.3°F | Ht 65.0 in | Wt 183.6 lb

## 2020-06-04 DIAGNOSIS — E782 Mixed hyperlipidemia: Secondary | ICD-10-CM | POA: Diagnosis not present

## 2020-06-04 DIAGNOSIS — E1165 Type 2 diabetes mellitus with hyperglycemia: Secondary | ICD-10-CM

## 2020-06-04 DIAGNOSIS — M65321 Trigger finger, right index finger: Secondary | ICD-10-CM | POA: Diagnosis not present

## 2020-06-04 DIAGNOSIS — R42 Dizziness and giddiness: Secondary | ICD-10-CM | POA: Diagnosis not present

## 2020-06-04 LAB — POCT GLYCOSYLATED HEMOGLOBIN (HGB A1C): Hemoglobin A1C: 7 % — AB (ref 4.0–5.6)

## 2020-06-04 MED ORDER — METFORMIN HCL ER 500 MG PO TB24: 500.0000 mg | ORAL_TABLET | Freq: Every day | ORAL | 3 refills | Status: DC

## 2020-06-04 NOTE — Assessment & Plan Note (Addendum)
Today's A1c is 7.0%, down from 7.1% in August. Concern for diarrhea with current Metformin. Will change to Glucophage-XR 500 mg daily for possible improvement with diarrhea. Continue Amaryl. Patient continue increasing exercise and low carb lifestyle. Ordered CMP, lipid panel, and microablumin. Follow-up in 3 months.

## 2020-06-04 NOTE — Progress Notes (Addendum)
BP (!) 107/57 (BP Location: Left Arm, Patient Position: Sitting, Cuff Size: Normal)   Pulse 64   Temp (!) 95.3 F (35.2 C) (Temporal)   Ht 5\' 5"  (1.651 m)   Wt 183 lb 9.6 oz (83.3 kg)   SpO2 98%   BMI 30.55 kg/m    Subjective:    Patient ID: Maria Sloan, female    DOB: [birth year], 70 y.o.   MRN: [id number]  HPI: Maria Sloan is a 70 y.o. female  Chief Complaint  Patient presents with  . Dizziness    Pt was diagnosed previously with vertigo. Pt has notice off balance while getting dizzy.Pt gets it randomly throughout the day, every 3 other days. Epley maneuver has been helping her.  . Pain    Right hand on index finger, can not bend the finger. Pain rate=5. Pt gets the pain worst when its cold weather.  Pt would like to switch medication Metformin due to diarrhea.   Dizziness Patient states that for the last 4-5 days she has been feeling off balance while going down stairs and is getting dizzy. Patient states it only lasts the few seconds it takes her to get down the stairs and goes away. Patient does have handrails that she uses to get down the steps of her home. Patient does have a history of vertigo that the  Epley maneuver helps with. Denies nausea with episodes.   Finger pain Pain Patient states that for the last 6 months she has had locking issues with her right index finger. Now for the past 3 months, she has been unable to fully bend the finger and the finger is swollen. Patient has repetitive motions with painting and sewing. There is pain 5/10 at joints that does not radiate to wrist or forearm. States that cold weather makes the pain worse.   Diabetes Patient has been checking blood sugar at home and this morning's fasting was 115. States she is trying to follow a low carb lifestyle and is increasing water intake. Also trying to increase movement by working in garden.    Hypertension  Taking Lisinopril for microalbumin Using medications without difficulty   Using  medication without problems and not orthostatic today to contribute to dizzyness No chest pain with exertion or shortness of breath No Edema  Elevated Cholesterol Using medications without problems No Muscle aches  Diet: Watching carb intake Exercise: Trying to increase by working in yard   Patient states she is having diarrhea related to her Metformin. It is so bad that she will not take her Metformin for several days before camping trip or vacations. Patient interested in changing medication to avoid side effects. ER change.    [place] Office Visit from 06/04/2020 in [place]  PHQ-9 Total Score 4     GAD 7 : Generalized Anxiety Score 06/04/2020 08/02/2019 07/19/2019 04/24/2019  Nervous, Anxious, on Edge 1 0 1 0  Control/stop worrying 1 0 1 0  Worry too much - different things 1 0 1 0  Trouble relaxing 1 0 1 0  Restless 0 0 0 0  Easily annoyed or irritable 0 0 2 0  Afraid - awful might happen 2 0 2 0  Total GAD 7 Score 6 0 8 0  Anxiety Difficulty Somewhat difficult Not difficult at all Very difficult Not difficult at all    Relevant past medical, surgical, family and social history reviewed and updated as indicated. Interim medical history since our last visit reviewed.  Allergies and medications reviewed and updated.  Review of Systems  Constitutional: Negative for fatigue and fever.  Cardiovascular: Negative for chest pain and leg swelling.  Gastrointestinal: Positive for diarrhea. Negative for nausea and vomiting.  Endocrine: Negative for polydipsia, polyphagia and polyuria.  Skin: Negative.   Neurological: Positive for dizziness. Negative for weakness and headaches.    Per HPI unless specifically indicated above     Objective:    BP (!) 107/57 (BP Location: Left Arm, Patient Position: Sitting, Cuff Size: Normal)   Pulse 64   Temp (!) 95.3 F (35.2 C) (Temporal)   Ht 5\' 5"  (1.651 m)   Wt 183 lb 9.6 oz (83.3 kg)   SpO2 98%   BMI 30.55 kg/m    Wt Readings from Last 3 Encounters:  06/04/20 183 lb 9.6 oz (83.3 kg)  05/28/20 184 lb (83.5 kg)  03/11/20 187 lb (84.8 kg)    Physical Exam Vitals reviewed.  HENT:     Head: Normocephalic and atraumatic.  Cardiovascular:     Rate and Rhythm: Normal rate and regular rhythm.     Pulses: Normal pulses.     Heart sounds: Normal heart sounds.  Pulmonary:     Breath sounds: Normal breath sounds.  Abdominal:     General: Bowel sounds are normal.     Palpations: Abdomen is soft.  Musculoskeletal:        General: Swelling present.     Right hand: Swelling present. Decreased range of motion. Normal capillary refill.     Cervical back: Normal range of motion.     Comments: Right index finger with swelling, decreased range of motion, and pain.   Skin:    General: Skin is warm and dry.  Neurological:     General: No focal deficit present.     Mental Status: She is alert and oriented to person, place, and time.  Psychiatric:        Speech: Speech normal.        Behavior: Behavior normal.        Thought Content: Thought content normal.     Results for orders placed or performed in visit on 06/04/20  POCT glycosylated hemoglobin (Hb A1C)  Result Value Ref Range   Hemoglobin A1C 7.0 (A) 4.0 - 5.6 %   HbA1c POC (<> result, manual entry)     HbA1c, POC (prediabetic range)     HbA1c, POC (controlled diabetic range)        Assessment & Plan:   Problem List Items Addressed This Visit      Unprioritized   Mixed dyslipidemia    Check lipid panel today.  Taking both Zetia and statin      Type 2 diabetes mellitus with hyperglycemia (HCC) - Primary    Today's A1c is 7.0%, down from 7.1% in August. Concern for diarrhea with current Metformin. Will change to Glucophage-XR 500 mg daily for possible improvement with diarrhea. Continue Amaryl. Patient continue increasing exercise and low carb lifestyle. Ordered CMP, lipid panel, and microablumin. Follow-up in 3 months.       Relevant  Medications   metFORMIN (GLUCOPHAGE-XR) 500 MG 24 hr tablet   Other Relevant Orders   POCT glycosylated hemoglobin (Hb A1C) (Completed)   Comprehensive metabolic panel   Lipid panel   Microalbumin, urine   Vertigo    Symptoms consistent with peripheral cause.  History of vertigo and does own Epley maneuvers. Will report back if continued problem.  Other Visit Diagnoses    Trigger index finger of right hand       Trigger finger symptoms for last 6 months. Has not seen specialist. Referral to orthopedic clinic.    Relevant Orders   Ambulatory referral to Orthopedic Surgery       Follow up plan: Return in about 3 months (around 09/03/2020).

## 2020-06-04 NOTE — Assessment & Plan Note (Signed)
Symptoms consistent with peripheral cause.  History of vertigo and does own Epley maneuvers. Will report back if continued problem.

## 2020-06-04 NOTE — Assessment & Plan Note (Signed)
Check lipid panel today.  Taking both Zetia and statin

## 2020-06-05 ENCOUNTER — Other Ambulatory Visit: Payer: Medicare HMO

## 2020-06-05 DIAGNOSIS — E1165 Type 2 diabetes mellitus with hyperglycemia: Secondary | ICD-10-CM

## 2020-06-06 LAB — COMPREHENSIVE METABOLIC PANEL
AG Ratio: 1.6 (calc) (ref 1.0–2.5)
ALT: 25 U/L (ref 6–29)
AST: 20 U/L (ref 10–35)
Albumin: 4.1 g/dL (ref 3.6–5.1)
Alkaline phosphatase (APISO): 57 U/L (ref 37–153)
BUN: 16 mg/dL (ref 7–25)
CO2: 28 mmol/L (ref 20–32)
Calcium: 9.1 mg/dL (ref 8.6–10.4)
Chloride: 107 mmol/L (ref 98–110)
Creat: 0.61 mg/dL (ref 0.60–0.93)
Globulin: 2.5 g/dL (calc) (ref 1.9–3.7)
Glucose, Bld: 155 mg/dL — ABNORMAL HIGH (ref 65–99)
Potassium: 4.3 mmol/L (ref 3.5–5.3)
Sodium: 142 mmol/L (ref 135–146)
Total Bilirubin: 0.4 mg/dL (ref 0.2–1.2)
Total Protein: 6.6 g/dL (ref 6.1–8.1)

## 2020-06-06 LAB — LIPID PANEL
Cholesterol: 110 mg/dL (ref <200)
HDL: 32 mg/dL — ABNORMAL LOW (ref >=50)
LDL Cholesterol (Calc): 55 mg/dL (calc)
Non-HDL Cholesterol (Calc): 78 mg/dL (calc) (ref <130)
Total CHOL/HDL Ratio: 3.4 (calc) (ref <5.0)
Triglycerides: 145 mg/dL (ref <150)

## 2020-06-06 LAB — MICROALBUMIN, URINE: Microalb, Ur: 0.6 mg/dL

## 2020-07-02 DIAGNOSIS — M65321 Trigger finger, right index finger: Secondary | ICD-10-CM | POA: Diagnosis not present

## 2020-07-16 DIAGNOSIS — R69 Illness, unspecified: Secondary | ICD-10-CM | POA: Diagnosis not present

## 2020-07-25 ENCOUNTER — Other Ambulatory Visit: Payer: Self-pay

## 2020-07-25 ENCOUNTER — Ambulatory Visit: Payer: Medicare HMO | Admitting: Internal Medicine

## 2020-07-25 ENCOUNTER — Encounter: Payer: Self-pay | Admitting: Internal Medicine

## 2020-07-25 VITALS — BP 118/61 | HR 67 | Temp 97.1°F | Resp 17 | Ht 65.0 in | Wt 181.2 lb

## 2020-07-25 DIAGNOSIS — R350 Frequency of micturition: Secondary | ICD-10-CM

## 2020-07-25 DIAGNOSIS — N898 Other specified noninflammatory disorders of vagina: Secondary | ICD-10-CM | POA: Diagnosis not present

## 2020-07-25 DIAGNOSIS — R300 Dysuria: Secondary | ICD-10-CM

## 2020-07-25 DIAGNOSIS — R3915 Urgency of urination: Secondary | ICD-10-CM

## 2020-07-25 LAB — POCT URINALYSIS DIPSTICK
Bilirubin, UA: NEGATIVE
Glucose, UA: NEGATIVE
Ketones, UA: NEGATIVE
Nitrite, UA: NEGATIVE
Protein, UA: POSITIVE — AB
Spec Grav, UA: 1.01 (ref 1.010–1.025)
Urobilinogen, UA: 0.2 E.U./dL
pH, UA: 5 (ref 5.0–8.0)

## 2020-07-25 MED ORDER — NITROFURANTOIN MONOHYD MACRO 100 MG PO CAPS: 100.0000 mg | ORAL_CAPSULE | Freq: Two times a day (BID) | ORAL | 0 refills | Status: DC

## 2020-07-25 MED ORDER — FLUCONAZOLE 150 MG PO TABS: 150.0000 mg | ORAL_TABLET | Freq: Once | ORAL | 0 refills | Status: AC

## 2020-07-25 NOTE — Patient Instructions (Signed)
Urinary Tract Infection, Adult A urinary tract infection (UTI) is an infection of any part of the urinary tract. The urinary tract includes:  The kidneys.  The ureters.  The bladder.  The urethra. These organs make, store, and get rid of pee (urine) in the body. What are the causes? This infection is caused by germs (bacteria) in your genital area. These germs grow and cause swelling (inflammation) of your urinary tract. What increases the risk? The following factors may make you more likely to develop this condition:  Using a small, thin tube (catheter) to drain pee.  Not being able to control when you pee or poop (incontinence).  Being female. If you are female, these things can increase the risk: ? Using these methods to prevent pregnancy:  A medicine that kills sperm (spermicide).  A device that blocks sperm (diaphragm). ? Having low levels of a female hormone (estrogen). ? Being pregnant. You are more likely to develop this condition if:  You have genes that add to your risk.  You are sexually active.  You take antibiotic medicines.  You have trouble peeing because of: ? A prostate that is bigger than normal, if you are female. ? A blockage in the part of your body that drains pee from the bladder. ? A kidney stone. ? A nerve condition that affects your bladder. ? Not getting enough to drink. ? Not peeing often enough.  You have other conditions, such as: ? Diabetes. ? A weak disease-fighting system (immune system). ? Sickle cell disease. ? Gout. ? Injury of the spine. What are the signs or symptoms? Symptoms of this condition include:  Needing to pee right away.  Peeing small amounts often.  Pain or burning when peeing.  Blood in the pee.  Pee that smells bad or not like normal.  Trouble peeing.  Pee that is cloudy.  Fluid coming from the vagina, if you are female.  Pain in the belly or lower back. Other symptoms include:  Vomiting.  Not  feeling hungry.  Feeling mixed up (confused). This may be the first symptom in older adults.  Being tired and grouchy (irritable).  A fever.  Watery poop (diarrhea). How is this treated?  Taking antibiotic medicine.  Taking other medicines.  Drinking enough water. In some cases, you may need to see a specialist. Follow these instructions at home: Medicines  Take over-the-counter and prescription medicines only as told by your doctor.  If you were prescribed an antibiotic medicine, take it as told by your doctor. Do not stop taking it even if you start to feel better. General instructions  Make sure you: ? Pee until your bladder is empty. ? Do not hold pee for a long time. ? Empty your bladder after sex. ? Wipe from front to back after peeing or pooping if you are a female. Use each tissue one time when you wipe.  Drink enough fluid to keep your pee pale yellow.  Keep all follow-up visits.   Contact a doctor if:  You do not get better after 1-2 days.  Your symptoms go away and then come back. Get help right away if:  You have very bad back pain.  You have very bad pain in your lower belly.  You have a fever.  You have chills.  You feeling like you will vomit or you vomit. Summary  A urinary tract infection (UTI) is an infection of any part of the urinary tract.  This condition is caused by   germs in your genital area.  There are many risk factors for a UTI.  Treatment includes antibiotic medicines.  Drink enough fluid to keep your pee pale yellow. This information is not intended to replace advice given to you by your health care provider. Make sure you discuss any questions you have with your health care provider. Document Revised: 09/29/2019 Document Reviewed: 09/29/2019 Elsevier Patient Education  2021 Elsevier Inc.  

## 2020-07-25 NOTE — Addendum Note (Signed)
Addended by: Wilson Singer on: 07/25/2020 11:02 AM   Modules accepted: Orders

## 2020-07-25 NOTE — Progress Notes (Signed)
HPI  Pt presents to the clinic today with c/o urinary urgency, frequency, dysuria, blood in her urine and vaginal itching. This started 5 days ago. She denies pelvic pain, low back pain, fever, chills, nausea or vomiting. She denies vaginal discharge, odor or abnormal bleeding. She has not taken anything OTC for these symptoms.   Review of Systems  Past Medical History:  Diagnosis Date  . Anxiety   . Arthritis   . Cervical cancer ([facility])   . Hyperlipidemia   . PVD (peripheral vascular disease) ([facility])    R leg venous insufficiency  . PVD (peripheral vascular disease) ([facility])   . Shingles   . Type 2 diabetes mellitus ([facility])     Family History  Problem Relation Age of Onset  . Emphysema Father   . Lung cancer Father   . Hyperlipidemia Father   . Stroke Mother   . Hyperlipidemia Sister   . Healthy Son     Social History   Socioeconomic History  . Marital status: Divorced    Spouse name: Not on file  . Number of children: Not on file  . Years of education: Not on file  . Highest education level: Bachelor's degree (e.g., BA, AB, BS)  Occupational History  . Occupation: retired   Tobacco Use  . Smoking status: Former Smoker    Packs/day: 1.00    Years: 25.00    Pack years: 25.00    Types: Cigarettes    Quit date: 11/29/1994    Years since quitting: 25.6  . Smokeless tobacco: Never Used  Vaping Use  . Vaping Use: Never used  Substance and Sexual Activity  . Alcohol use: Yes    Alcohol/week: 0.0 standard drinks    Comment: occasionally  . Drug use: Never  . Sexual activity: Not Currently    Birth control/protection: None  Other Topics Concern  . Not on file  Social History Narrative   Divorced.   1 son.    Moved from [place].   Enjoys making jewelry, spending time with family, crafts, reading.   Social Determinants of Health   Financial Resource Strain: Low Risk   . Difficulty of Paying Living Expenses: Not hard at all  Food Insecurity: No Food Insecurity  .  Worried About Charity fundraiser in the Last Year: Never true  . Ran Out of Food in the Last Year: Never true  Transportation Needs: No Transportation Needs  . Lack of Transportation (Medical): No  . Lack of Transportation (Non-Medical): No  Physical Activity: Insufficiently Active  . Days of Exercise per Week: 3 days  . Minutes of Exercise per Session: 20 min  Stress: No Stress Concern Present  . Feeling of Stress : Not at all  Social Connections: Not on file  Intimate Partner Violence: Not on file    Allergies  Allergen Reactions  . Prochlorperazine Other (See Comments)    Compazine  . Other     Other reaction(s): Dizziness  . Actifed Cold-Allergy [Chlorpheniramine-Phenylephrine]     Ok with Robitussin, Mucinex  . Penicillins      Constitutional: Denies fever, malaise, fatigue, headache or abrupt weight changes.   GU: Pt reports urgency, frequency, pain with urination and vaginal itching. Denies blood in urine, odor or discharge. Skin: Denies redness, rashes, lesions or ulcercations.   No other specific complaints in a complete review of systems (except as listed in HPI above).    Objective:   Physical Exam  BP 118/61 (BP Location: Right Arm, Patient  Position: Sitting, Cuff Size: Normal)   Pulse 67   Temp (!) 97.1 F (36.2 C) (Temporal)   Resp 17   Ht 5\' 5"  (1.651 m)   Wt 181 lb 3.2 oz (82.2 kg)   SpO2 99%   BMI 30.15 kg/m   Wt Readings from Last 3 Encounters:  06/04/20 183 lb 9.6 oz (83.3 kg)  05/28/20 184 lb (83.5 kg)  03/11/20 187 lb (84.8 kg)    General: Appears her stated age, obese, in NAD. Cardiovascular: Normal rate. Pulmonary/Chest: Normal effort.         Assessment & Plan:   Urgency, Frequency, Dysuria, Vaginal Itching secondary to UTI  Urinalysis: 1+ leuks, 3+ blood Will send urine culture eRx sent if for Macrobid 100 mg BID x 5 days RX for Diflucan 150 mg PO x 1 for possible yeast infection OK to take AZO OTC Drink plenty of  fluids  RTC as needed or if symptoms persist. Webb Silversmith, NP This visit occurred during the SARS-CoV-2 public health emergency.  Safety protocols were in place, including screening questions prior to the visit, additional usage of staff PPE, and extensive cleaning of exam room while observing appropriate contact time as indicated for disinfecting solutions.

## 2020-07-26 ENCOUNTER — Other Ambulatory Visit: Payer: Self-pay

## 2020-07-26 DIAGNOSIS — E1165 Type 2 diabetes mellitus with hyperglycemia: Secondary | ICD-10-CM

## 2020-07-26 MED ORDER — ACCU-CHEK AVIVA PLUS VI STRP: ORAL_STRIP | 12 refills | Status: DC

## 2020-07-27 LAB — URINE CULTURE
MICRO NUMBER:: 11939475
SPECIMEN QUALITY:: ADEQUATE

## 2020-08-02 ENCOUNTER — Other Ambulatory Visit: Payer: Self-pay

## 2020-08-02 ENCOUNTER — Other Ambulatory Visit: Payer: Self-pay | Admitting: Internal Medicine

## 2020-08-02 DIAGNOSIS — F419 Anxiety disorder, unspecified: Secondary | ICD-10-CM

## 2020-08-02 DIAGNOSIS — E1165 Type 2 diabetes mellitus with hyperglycemia: Secondary | ICD-10-CM

## 2020-08-02 DIAGNOSIS — F32A Depression, unspecified: Secondary | ICD-10-CM

## 2020-08-02 MED ORDER — GLIMEPIRIDE 2 MG PO TABS
ORAL_TABLET | ORAL | 2 refills | Status: DC
Start: 2020-08-02 — End: 2021-05-02

## 2020-08-02 NOTE — Telephone Encounter (Signed)
Requested medications are due for refill today yes  Requested medications are on the active medication list yes  Last refill 3/5  Last visit Was seen 06/04/20, this med/dx not addressed, asked to return in July, only upcoming appt is in 05/2021  Future visit scheduled 05/2021  Notes to clinic Please assess, Failed protocol due to no valid visit within 6  months.

## 2020-08-06 ENCOUNTER — Other Ambulatory Visit: Payer: Self-pay

## 2020-08-06 DIAGNOSIS — F419 Anxiety disorder, unspecified: Secondary | ICD-10-CM

## 2020-08-06 DIAGNOSIS — F32A Depression, unspecified: Secondary | ICD-10-CM

## 2020-08-07 ENCOUNTER — Other Ambulatory Visit: Payer: Self-pay

## 2020-08-07 DIAGNOSIS — F32A Depression, unspecified: Secondary | ICD-10-CM

## 2020-08-08 MED ORDER — BUSPIRONE HCL 5 MG PO TABS: ORAL_TABLET | ORAL | 1 refills | Status: DC

## 2020-10-29 ENCOUNTER — Other Ambulatory Visit: Payer: Self-pay | Admitting: Family Medicine

## 2020-10-29 DIAGNOSIS — E782 Mixed hyperlipidemia: Secondary | ICD-10-CM

## 2020-10-29 NOTE — Telephone Encounter (Signed)
Requested Prescriptions  Pending Prescriptions Disp Refills  . ezetimibe (ZETIA) 10 MG tablet [Pharmacy Med Name: EZETIMIBE 10 MG TABLET] 90 tablet 1    Sig: TAKE 1 TABLET BY MOUTH EVERY DAY     Cardiovascular:  Antilipid - Sterol Transport Inhibitors Failed - 10/29/2020  3:44 AM      Failed - HDL in normal range and within 360 days    HDL  Date Value Ref Range Status  06/05/2020 32 (L) > OR = 50 mg/dL Final         Passed - Total Cholesterol in normal range and within 360 days    Cholesterol  Date Value Ref Range Status  06/05/2020 110 <200 mg/dL Final         Passed - LDL in normal range and within 360 days    LDL Cholesterol (Calc)  Date Value Ref Range Status  06/05/2020 55 mg/dL (calc) Final    Comment:    Reference range: <100 . Desirable range <100 mg/dL for primary prevention;   <70 mg/dL for patients with CHD or diabetic patients  with > or = 2 CHD risk factors. Marland Kitchen LDL-C is now calculated using the Martin-Hopkins  calculation, which is a validated novel method providing  better accuracy than the Friedewald equation in the  estimation of LDL-C.  Cresenciano Genre et al. Annamaria Helling. [id number]): 2061-2068  (http://education.[employer].com/faq/FAQ164)    Direct LDL  Date Value Ref Range Status  12/13/2015 115.0 mg/dL Final    Comment:    Optimal:  <100 mg/dLNear or Above Optimal:  100-129 mg/dLBorderline High:  130-159 mg/dLHigh:  160-189 mg/dLVery High:  >190 mg/dL         Passed - Triglycerides in normal range and within 360 days    Triglycerides  Date Value Ref Range Status  06/05/2020 145 <150 mg/dL Final         Passed - Valid encounter within last 12 months    Recent Outpatient Visits          3 months ago Urinary urgency   [place], Coralie Keens, NP   4 months ago Type 2 diabetes mellitus with hyperglycemia, without long-term current use of insulin [facility])   [facility] Kathrine Haddock, NP   7 months ago [place], FNP   1 year ago Type 2 diabetes mellitus with hyperglycemia, without long-term current use of insulin [facility])   [place], FNP   1 year ago Skin infection   [facility], Lupita Raider, FNP      Future Appointments            In 7 months [facility], [facility]

## 2020-12-02 ENCOUNTER — Other Ambulatory Visit: Payer: Self-pay | Admitting: Unknown Physician Specialty

## 2020-12-02 NOTE — Telephone Encounter (Signed)
Requested medication (s) are due for refill today: yes  Requested medication (s) are on the active medication list: yes  Last refill: 06/04/20  #60  3 refills  Future visit scheduled  06/03/21 with nurse  Notes to clinic: Historical provider. Called pharmacy, states no refills available, expired  Requested Prescriptions  Pending Prescriptions Disp Refills   metFORMIN (GLUCOPHAGE-XR) 500 MG 24 hr tablet [Pharmacy Med Name: METFORMIN HCL ER 500 MG TABLET] 90 tablet 2    Sig: TAKE 1 TABLET BY Echo     Endocrinology:  Diabetes - Biguanides Failed - 12/02/2020  1:41 AM      Failed - HBA1C is between 0 and 7.9 and within 180 days    Hemoglobin A1C  Date Value Ref Range Status  06/04/2020 7.0 (A) 4.0 - 5.6 % Final   Hgb A1c MFr Bld  Date Value Ref Range Status  08/15/2018 6.1 (H) <5.7 % of total Hgb Final    Comment:    For someone without known diabetes, a hemoglobin  A1c value between 5.7% and 6.4% is consistent with prediabetes and should be confirmed with a  follow-up test. . For someone with known diabetes, a value <7% indicates that their diabetes is well controlled. A1c targets should be individualized based on duration of diabetes, age, comorbid conditions, and other considerations. . This assay result is consistent with an increased risk of diabetes. . Currently, no consensus exists regarding use of hemoglobin A1c for diagnosis of diabetes for children. .           Failed - eGFR in normal range and within 360 days    GFR, Est African American  Date Value Ref Range Status  02/11/2018 109 > OR = 60 mL/min/1.56m Final   GFR, Est Non African American  Date Value Ref Range Status  02/11/2018 94 > OR = 60 mL/min/1.782mFinal   GFR  Date Value Ref Range Status  07/12/2015 87.75 >60.00 mL/min Final          Passed - Cr in normal range and within 360 days    Creat  Date Value Ref Range Status  06/05/2020 0.61 0.60 - 0.93 mg/dL Final     Comment:    For patients >4957ears of age, the reference limit for Creatinine is approximately 13% higher for people identified as African-American. .    Creatinine,U  Date Value Ref Range Status  12/13/2015 153.6 mg/dL Final          Passed - Valid encounter within last 6 months    Recent Outpatient Visits           4 months ago Urinary urgency   [facility]   6 months ago Type 2 diabetes mellitus with hyperglycemia, [facility] long[facility] use [facility]  [facility]   8 months ago [facility]   1 year ago Type 2 diabetes mellitus with hyperglycemia, without long-term current use of [facility]  [facility]   1 year ago Skin infection   [facility]       Future Appointments             [facility]

## 2021-03-06 ENCOUNTER — Encounter: Payer: Self-pay | Admitting: Internal Medicine

## 2021-03-06 ENCOUNTER — Ambulatory Visit: Payer: Self-pay | Admitting: *Deleted

## 2021-03-06 ENCOUNTER — Other Ambulatory Visit: Payer: Self-pay

## 2021-03-06 ENCOUNTER — Emergency Department: Payer: Medicare HMO

## 2021-03-06 DIAGNOSIS — R0602 Shortness of breath: Secondary | ICD-10-CM | POA: Diagnosis not present

## 2021-03-06 DIAGNOSIS — J029 Acute pharyngitis, unspecified: Secondary | ICD-10-CM | POA: Insufficient documentation

## 2021-03-06 DIAGNOSIS — Z5321 Procedure and treatment not carried out due to patient leaving prior to being seen by health care provider: Secondary | ICD-10-CM | POA: Diagnosis not present

## 2021-03-06 DIAGNOSIS — R0981 Nasal congestion: Secondary | ICD-10-CM | POA: Insufficient documentation

## 2021-03-06 DIAGNOSIS — M791 Myalgia, unspecified site: Secondary | ICD-10-CM | POA: Insufficient documentation

## 2021-03-06 DIAGNOSIS — Z20822 Contact with and (suspected) exposure to covid-19: Secondary | ICD-10-CM | POA: Insufficient documentation

## 2021-03-06 DIAGNOSIS — R509 Fever, unspecified: Secondary | ICD-10-CM | POA: Insufficient documentation

## 2021-03-06 DIAGNOSIS — R059 Cough, unspecified: Secondary | ICD-10-CM | POA: Diagnosis not present

## 2021-03-06 LAB — COMPREHENSIVE METABOLIC PANEL
ALT: 17 U/L (ref 0–44)
AST: 17 U/L (ref 15–41)
Albumin: 3.8 g/dL (ref 3.5–5.0)
Alkaline Phosphatase: 53 U/L (ref 38–126)
Anion gap: 7 (ref 5–15)
BUN: 12 mg/dL (ref 8–23)
CO2: 24 mmol/L (ref 22–32)
Calcium: 8.7 mg/dL — ABNORMAL LOW (ref 8.9–10.3)
Chloride: 98 mmol/L (ref 98–111)
Creatinine, Ser: 0.53 mg/dL (ref 0.44–1.00)
GFR, Estimated: >60 mL/min (ref >60)
Glucose, Bld: 248 mg/dL — ABNORMAL HIGH (ref 70–99)
Potassium: 3.6 mmol/L (ref 3.5–5.1)
Sodium: 129 mmol/L — ABNORMAL LOW (ref 135–145)
Total Bilirubin: 1 mg/dL (ref 0.3–1.2)
Total Protein: 7.7 g/dL (ref 6.5–8.1)

## 2021-03-06 LAB — CBC WITH DIFFERENTIAL/PLATELET
Abs Immature Granulocytes: 0.04 10*3/uL (ref 0.00–0.07)
Basophils Absolute: 0 10*3/uL (ref 0.0–0.1)
Basophils Relative: 0 %
Eosinophils Absolute: 0 10*3/uL (ref 0.0–0.5)
Eosinophils Relative: 0 %
HCT: 37.5 % (ref 36.0–46.0)
Hemoglobin: 13 g/dL (ref 12.0–15.0)
Immature Granulocytes: 0 %
Lymphocytes Relative: 13 %
Lymphs Abs: 1.3 10*3/uL (ref 0.7–4.0)
MCH: 30 pg (ref 26.0–34.0)
MCHC: 34.7 g/dL (ref 30.0–36.0)
MCV: 86.6 fL (ref 80.0–100.0)
Monocytes Absolute: 1 10*3/uL (ref 0.1–1.0)
Monocytes Relative: 10 %
Neutro Abs: 7.8 10*3/uL — ABNORMAL HIGH (ref 1.7–7.7)
Neutrophils Relative %: 77 %
Platelets: 184 10*3/uL (ref 150–400)
RBC: 4.33 MIL/uL (ref 3.87–5.11)
RDW: 12.6 % (ref 11.5–15.5)
WBC: 10.2 10*3/uL (ref 4.0–10.5)
nRBC: 0 % (ref 0.0–0.2)

## 2021-03-06 LAB — RESP PANEL BY RT-PCR (FLU A&B, COVID) ARPGX2
Influenza A by PCR: NEGATIVE
Influenza B by PCR: NEGATIVE
SARS Coronavirus 2 by RT PCR: NEGATIVE

## 2021-03-06 NOTE — Telephone Encounter (Signed)
°  Chief Complaint: shortness of breath Symptoms: talking in phrases, sore throat, coughing, fever  Frequency: since Sat. Pertinent Negatives: Patient denies coughing up anything but she was in the beginning. Disposition: [x] ED /[] Urgent Care (no appt availability in office) / [] Appointment(In office/virtual)/ []  [facility]/ [] Home Care/ [] Refused Recommended Disposition /[] [facility]/ []  Follow-up with PCP Additional Notes: Referred to the ED.   Going to [facility].

## 2021-03-06 NOTE — Telephone Encounter (Signed)
Will review ED note 

## 2021-03-06 NOTE — Telephone Encounter (Signed)
Reason for Disposition  [1] MODERATE difficulty breathing (e.g., speaks in phrases, SOB even at rest, pulse 100-120) AND [2] still present when not coughing  Answer Assessment - Initial Assessment Questions 1. ONSET: "When did the cough begin?"      Last Sat. Started feeling bad.   Then Tues cough started and was productive but it's not now.   I have nasal congestion and sore throat with ear pain.   She is short of breath this morning.   I had fever 102 day before yesterday.   This morning it's 100.7.  I'm taking Tylenol.    [facility] you need to go to the ED now.   You have a neighbor that can take you now.   Pt is very short of breath and only able to talk in short phrases.   She was agreeable to going to the ED. 2. SEVERITY: "How bad is the cough today?"      *No Answer* 3. SPUTUM: "Describe the color of your sputum" (none, dry cough; clear, white, yellow, green)     *No Answer* 4. HEMOPTYSIS: "Are you coughing up any blood?" If so ask: "How much?" (flecks, streaks, tablespoons, etc.)     *No Answer* 5. DIFFICULTY BREATHING: "Are you having difficulty breathing?" If Yes, ask: "How bad is it?" (e.g., mild, moderate, severe)    - MILD: No SOB at rest, mild SOB with walking, speaks normally in sentences, can lie down, no retractions, pulse < 100.    - MODERATE: SOB at rest, SOB with minimal exertion and prefers to sit, cannot lie down flat, speaks in phrases, mild retractions, audible wheezing, pulse 100-120.    - SEVERE: Very SOB at rest, speaks in single words, struggling to breathe, sitting hunched forward, retractions, pulse > 120      Severe  6. FEVER: "Do you have a fever?" If Yes, ask: "What is your temperature, how was it measured, and when did it start?"     Yes 7. CARDIAC HISTORY: "Do you have any history of heart disease?" (e.g., heart attack, congestive heart failure)      *No Answer* 8. LUNG HISTORY: "Do you have any history of lung disease?"  (e.g.,  pulmonary embolus, asthma, emphysema)     *No Answer* 9. PE RISK FACTORS: "Do you have a history of blood clots?" (or: recent major surgery, recent prolonged travel, bedridden)     *No Answer* 10. OTHER SYMPTOMS: "Do you have any other symptoms?" (e.g., runny nose, wheezing, chest pain)       Nasal congestion, ear pain, sore throat all started Sat and has gotten worse. 11. PREGNANCY: "Is there any chance you are pregnant?" "When was your last menstrual period?"       *No Answer* 12. TRAVEL: "Have you traveled out of the country in the last month?" (e.g., travel history, exposures)       *No Answer*  Protocols used: Cough - Acute Non-Productive-A-AH

## 2021-03-06 NOTE — ED Triage Notes (Signed)
Pt c/o cough with chest and sinus congestion, sore throat, fever and body aches with SOB since Saturday states she has been taking tussin and tylenol for sx. Pt is in NAD at present. Ambulatory with a steady gait

## 2021-03-06 NOTE — ED Provider Triage Note (Signed)
Emergency Medicine Provider Triage Evaluation Note  Maria Sloan , a 71 y.o. female  was evaluated in triage.  Pt complains of cough, chest and sinus congestion, sorethroat, fever and bodyaches x 6 days. + covid and flu vaccine. Former smoker.  Review of Systems  Positive: Cough, sorethroat, fever and body aches. Negative: No nausea, vomiting or diarrhea.  Physical Exam  BP 123/66 (BP Location: Left Arm)    Pulse 97    Temp 99.2 F (37.3 C) (Oral)    Resp 18    Ht 5\' 5"  (1.651 m)    Wt 83.9 kg    SpO2 92%    BMI 30.79 kg/m  Gen:   Awake, no distress  able to talk but hoarse Resp:  Normal effort.  Patient has a very congested cough, able to answer questions without SOB noticed.   CV:                  Heart regular rate and rhythm MSK:   Moves extremities without difficulty  Other:    Medical Decision Making  Medically screening exam initiated at 9:28 AM.  Appropriate orders placed.  Bijal Siglin was informed that the remainder of the evaluation will be completed by another provider, this initial triage assessment does not replace that evaluation, and the importance of remaining in the ED until their evaluation is complete.  Nasal swab sent to lab   Johnn Hai, PA-C 03/06/21 1023

## 2021-03-07 ENCOUNTER — Emergency Department
Admission: EM | Admit: 2021-03-07 | Discharge: 2021-03-07 | Disposition: A | Discharge status: Home / Self Care | Payer: Medicare HMO | Attending: Emergency Medicine | Admitting: Emergency Medicine

## 2021-03-07 LAB — PROCALCITONIN: Procalcitonin: 0.29 ng/mL

## 2021-03-07 NOTE — ED Notes (Signed)
Called pt for flex x1.

## 2021-03-11 ENCOUNTER — Encounter: Payer: Self-pay | Admitting: Internal Medicine

## 2021-03-11 ENCOUNTER — Ambulatory Visit (INDEPENDENT_AMBULATORY_CARE_PROVIDER_SITE_OTHER): Payer: Medicare HMO | Admitting: Internal Medicine

## 2021-03-11 ENCOUNTER — Other Ambulatory Visit: Payer: Self-pay

## 2021-03-11 VITALS — BP 127/57 | HR 87 | Temp 98.5°F | Ht 65.0 in | Wt 181.0 lb

## 2021-03-11 DIAGNOSIS — E871 Hypo-osmolality and hyponatremia: Secondary | ICD-10-CM | POA: Diagnosis not present

## 2021-03-11 DIAGNOSIS — R0602 Shortness of breath: Secondary | ICD-10-CM

## 2021-03-11 DIAGNOSIS — J029 Acute pharyngitis, unspecified: Secondary | ICD-10-CM | POA: Diagnosis not present

## 2021-03-11 DIAGNOSIS — Z23 Encounter for immunization: Secondary | ICD-10-CM | POA: Diagnosis not present

## 2021-03-11 DIAGNOSIS — R051 Acute cough: Secondary | ICD-10-CM

## 2021-03-11 DIAGNOSIS — R519 Headache, unspecified: Secondary | ICD-10-CM | POA: Diagnosis not present

## 2021-03-11 DIAGNOSIS — R062 Wheezing: Secondary | ICD-10-CM | POA: Diagnosis not present

## 2021-03-11 DIAGNOSIS — R509 Fever, unspecified: Secondary | ICD-10-CM | POA: Diagnosis not present

## 2021-03-11 DIAGNOSIS — R0989 Other specified symptoms and signs involving the circulatory and respiratory systems: Secondary | ICD-10-CM

## 2021-03-11 LAB — BASIC METABOLIC PANEL
BUN/Creatinine Ratio: 19 (calc) (ref 6–22)
BUN: 9 mg/dL (ref 7–25)
CO2: 28 mmol/L (ref 20–32)
Calcium: 9.1 mg/dL (ref 8.6–10.4)
Chloride: 98 mmol/L (ref 98–110)
Creat: 0.48 mg/dL — ABNORMAL LOW (ref 0.60–1.00)
Glucose, Bld: 238 mg/dL — ABNORMAL HIGH (ref 65–99)
Potassium: 5 mmol/L (ref 3.5–5.3)
Sodium: 135 mmol/L (ref 135–146)

## 2021-03-11 LAB — CBC
HCT: 38.7 % (ref 35.0–45.0)
Hemoglobin: 12.8 g/dL (ref 11.7–15.5)
MCH: 29.9 pg (ref 27.0–33.0)
MCHC: 33.1 g/dL (ref 32.0–36.0)
MCV: 90.4 fL (ref 80.0–100.0)
MPV: 11.5 fL (ref 7.5–12.5)
Platelets: 269 10*3/uL (ref 140–400)
RBC: 4.28 10*6/uL (ref 3.80–5.10)
RDW: 12.7 % (ref 11.0–15.0)
WBC: 10.1 10*3/uL (ref 3.8–10.8)

## 2021-03-11 MED ORDER — AZITHROMYCIN 250 MG PO TABS: ORAL_TABLET | ORAL | 0 refills | Status: DC

## 2021-03-11 MED ORDER — ALBUTEROL SULFATE HFA 108 (90 BASE) MCG/ACT IN AERS: 2.0000 | INHALATION_SPRAY | Freq: Four times a day (QID) | RESPIRATORY_TRACT | 0 refills | Status: DC | PRN

## 2021-03-11 MED ORDER — PREDNISONE 10 MG PO TABS: ORAL_TABLET | ORAL | 0 refills | Status: DC

## 2021-03-11 MED ORDER — HYDROCODONE BIT-HOMATROP MBR 5-1.5 MG/5ML PO SOLN: 5.0000 mL | Freq: Three times a day (TID) | ORAL | 0 refills | Status: DC | PRN

## 2021-03-11 NOTE — Progress Notes (Signed)
Subjective:    Patient ID: Maria Sloan, female    DOB: [birth year], 71 y.o.   MRN: [id number]  HPI  Patient presents to clinic today for ER follow-up.  She presented to the ER 03/07/2021 with complaint of headache, runny nose, sore throat, cough, wheezing and shortness of breath.  The symptoms started 10 days ago. Her headache is located in her temples and the top of her head. She describes the pain as sharp and stabbing. She denies dizziness or vision changes. She is blowing clear mucous out of her nose. She  denies difficulty swallowing. The cough is productive of clear/tan mucous. She reports fever up to 101.8, but denies chills or body aches.  She denies nasal congestion, ear pain, chest pain, nausea, vomiting or diarrhea.  She has been taking Mucinex DM and Tylenol with minimal relief of symptoms. She has no history of asthma, does not smoke, has not had sick contacts. She has had her flu and covid vaccines. Her white count was normal.  Her c-Met revealed hyponatremia with a sodium of 129, otherwise unremarkable.  She tested negative for COVID and flu.  Her chest x-ray showed streaky bibasilar opacities likely representing atelectasis.  She ended up leaving without being seen secondary to long wait times.   Review of Systems     Past Medical History:  Diagnosis Date   Anxiety    Arthritis    Cervical cancer ([place])    Hyperlipidemia    PVD (peripheral vascular disease) (HCC)    R leg venous insufficiency   PVD (peripheral vascular disease) (HCC)    Shingles    Type 2 diabetes mellitus (HCC)     Current Outpatient Medications  Medication Sig Dispense Refill   Accu-Chek Softclix Lancets lancets ONE TIME DAILY AS DIRECTED. FOR ICD-10 E10.9, E11.9 A1C 02/11/2018- 11.8% 100 each 12   atorvastatin (LIPITOR) 20 MG tablet TAKE 1 TABLET BY MOUTH EVERY DAY 90 tablet 1   Blood Glucose Monitoring Suppl (ACCU-CHEK AVIVA PLUS) w/Device KIT 1 Device by Does not apply route as directed. . One time  daily as directed. FOR ICD-10 E10.9, E11.9 A1C 02/11/2018- 11.8% 1 kit 0   busPIRone (BUSPAR) 5 MG tablet TAKE 1 TABLET BY MOUTH EVERYDAY AT BEDTIME 90 tablet 1   CALCIUM CITRATE PO Take 1 tablet by mouth daily.      Cholecalciferol (VITAMIN D PO) Take 1 tablet by mouth daily.      citalopram (CELEXA) 20 MG tablet TAKE 1 TABLET BY MOUTH EVERY DAY 90 tablet 2   ezetimibe (ZETIA) 10 MG tablet TAKE 1 TABLET BY MOUTH EVERY DAY 90 tablet 1   fluticasone (FLONASE) 50 MCG/ACT nasal spray PLACE 2 SPRAYS INTO BOTH NOSTRILS DAILY. USE FOR 4-6 WEEKS THEN STOP AND USE AS NEEDED 48 mL 0   glimepiride (AMARYL) 2 MG tablet TAKE 1 TABLET BY MOUTH EVERY DAY WITH BREAKFAST 90 tablet 2   glucose blood (ACCU-CHEK AVIVA PLUS) test strip USE UP TO ONE TIME DAILY AS DIRECTED. FOR ICD-10 E10.9, E11.9 A1C 02/11/2018- 11.8% 100 strip 12   lisinopril (ZESTRIL) 5 MG tablet Take 1 tablet (5 mg total) by mouth daily. 90 tablet 3   meloxicam (MOBIC) 15 MG tablet Take 15 mg by mouth daily.      metFORMIN (GLUCOPHAGE-XR) 500 MG 24 hr tablet Take 1 tablet (500 mg total) by mouth daily with breakfast. 60 tablet 3   methocarbamol (ROBAXIN) 500 MG tablet Take 500 mg by mouth at bedtime.  Multiple Vitamin (MULTIVITAMIN) tablet Take 1 tablet by mouth daily.     nitrofurantoin, macrocrystal-monohydrate, (MACROBID) 100 MG capsule Take 1 capsule (100 mg total) by mouth 2 (two) times daily. 10 capsule 0   Omega-3 Fatty Acids (FISH OIL) 1000 MG CAPS Take 1,000 mg by mouth 3 (three) times daily.     Potassium 75 MG TABS Take 1 tablet by mouth daily.     No current facility-administered medications for this visit.    Allergies  Allergen Reactions   Prochlorperazine Other (See Comments)    Compazine   Other     Other reaction(s): Dizziness   Actifed Cold-Allergy [Chlorpheniramine-Phenylephrine]     Ok with Robitussin, Mucinex   Penicillins     Family History  Problem Relation Age of Onset   Emphysema Father    Lung cancer  Father    Hyperlipidemia Father    Stroke Mother    Hyperlipidemia Sister    Healthy Son     Social History   Socioeconomic History   Marital status: Divorced    Spouse name: Not on file   Number of children: Not on file   Years of education: Not on file   Highest education level: Bachelor's degree (e.g., BA, AB, BS)  Occupational History   Occupation: retired   Tobacco Use   Smoking status: Former    Packs/day: 1.00    Years: 25.00    Pack years: 25.00    Types: Cigarettes    Quit date: 11/29/1994    Years since quitting: 26.2   Smokeless tobacco: Never  Vaping Use   Vaping Use: Never used  Substance and Sexual Activity   Alcohol use: Yes    Alcohol/week: 0.0 standard drinks    Comment: occasionally   Drug use: Never   Sexual activity: Not Currently    Birth control/protection: None  Other Topics Concern   Not on file  Social History Narrative   Divorced.   1 son.    Moved from [place].   Enjoys making jewelry, spending time with family, crafts, reading.   Social Determinants of Health   Financial Resource Strain: Low Risk    Difficulty of Paying Living Expenses: Not hard at all  Food Insecurity: No Food Insecurity   Worried About Charity fundraiser in the Last Year: Never true   [place] in the Last Year: Never true  Transportation Needs: No Transportation Needs   Lack of Transportation (Medical): No   Lack of Transportation (Non-Medical): No  Physical Activity: Insufficiently Active   Days of Exercise per Week: 3 days   Minutes of Exercise per Session: 20 min  Stress: No Stress Concern Present   Feeling of Stress : Not at all  Social Connections: Not on file  Intimate Partner Violence: Not on file     Constitutional: Patient reports fever, malaise, fatigue and headaches.  Denies abrupt weight changes.  HEENT: Patient reports runny nose and sore throat.  Denies eye pain, eye redness, ear pain, ringing in the ears, wax buildup, nasal congestion,  bloody nose. Respiratory: Patient reports cough, wheezing and shortness of breath.  Denies difficulty breathing.   Cardiovascular: Denies chest pain, chest tightness, palpitations or swelling in the hands or feet.  Gastrointestinal: Denies abdominal pain, bloating, constipation, diarrhea or blood in the stool.   No other specific complaints in a complete review of systems (except as listed in HPI above).  Objective:   Physical Exam  BP (!) 127/57  Pulse 87    Temp 98.5 F (36.9 C) (Oral)    Ht '5\' 5"'  (1.651 m)    Wt 181 lb (82.1 kg)    SpO2 93%    BMI 30.12 kg/m   Wt Readings from Last 3 Encounters:  03/06/21 185 lb (83.9 kg)  07/25/20 181 lb 3.2 oz (82.2 kg)  06/04/20 183 lb 9.6 oz (83.3 kg)    General: Appears her stated age, ill-appearing but in NAD. Skin: Warm, dry and intact. HEENT: Head: normal shape and size; Eyes: sclera white and EOMs intact;  Throat/Mouth: Teeth present, mucosa erythematous and moist, + PND, no exudate, lesions or ulcerations noted.  Neck: No adenopathy noted. Cardiovascular: Normal rate and rhythm. S1,S2 noted.  No murmur, rubs or gallops noted.  Pulmonary/Chest: Normal effort and positive vesicular breath sounds with audible wheezing. No respiratory distress. No rales or ronchi noted.  Musculoskeletal:  No difficulty with gait.  Neurological: Alert and oriented.    BMET    Component Value Date/Time   NA 129 (L) 03/06/2021 0933   K 3.6 03/06/2021 0933   CL 98 03/06/2021 0933   CO2 24 03/06/2021 0933   GLUCOSE 248 (H) 03/06/2021 0933   BUN 12 03/06/2021 0933   CREATININE 0.53 03/06/2021 0933   CREATININE 0.61 06/05/2020 0802   CALCIUM 8.7 (L) 03/06/2021 0933   GFRNONAA >60 03/06/2021 0933   GFRNONAA 94 02/11/2018 0928   GFRAA 109 02/11/2018 0928    Lipid Panel     Component Value Date/Time   CHOL 110 06/05/2020 0802   TRIG 145 06/05/2020 0802   HDL 32 (L) 06/05/2020 0802   CHOLHDL 3.4 06/05/2020 0802   LDLCALC 55 06/05/2020 0802     CBC    Component Value Date/Time   WBC 10.2 03/06/2021 0933   RBC 4.33 03/06/2021 0933   HGB 13.0 03/06/2021 0933   HCT 37.5 03/06/2021 0933   PLT 184 03/06/2021 0933   MCV 86.6 03/06/2021 0933   MCH 30.0 03/06/2021 0933   MCHC 34.7 03/06/2021 0933   RDW 12.6 03/06/2021 0933   LYMPHSABS 1.3 03/06/2021 0933   MONOABS 1.0 03/06/2021 0933   EOSABS 0.0 03/06/2021 0933   BASOSABS 0.0 03/06/2021 0933    Hgb A1C Lab Results  Component Value Date   HGBA1C 7.0 (A) 06/04/2020           Assessment & Plan:  Acute Headache, Runny Nose, Sore Throat, Cough, Wheezing, Shortness of Breath, Fever, Hyponatremia:  ER labs and imaging reviewed We will repeat CBC and be met today Given duration of symptoms will treat with Prednisone and Azithromycin 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  Safety protocols were in place, including screening questions prior to the visit, additional usage of staff PPE, and extensive cleaning of exam room while observing appropriate contact time as indicated for disinfecting solutions.

## 2021-03-11 NOTE — Patient Instructions (Signed)

## 2021-04-02 ENCOUNTER — Other Ambulatory Visit: Payer: Self-pay | Admitting: Internal Medicine

## 2021-04-02 NOTE — Telephone Encounter (Signed)
Requested medication (s) are due for refill today: requesting 9 days early  Requested medication (s) are on the active medication list: yes  Last refill:  03/11/21  Future visit scheduled: no  Notes to clinic:  requesting inhaler in less than 1 month, please assess.  Requested Prescriptions  Pending Prescriptions Disp Refills   albuterol (VENTOLIN HFA) 108 (90 Base) MCG/ACT inhaler [Pharmacy Med Name: ALBUTEROL HFA (PROAIR) INHALER] 8.5 each     Sig: TAKE 2 PUFFS BY MOUTH EVERY 6 HOURS AS NEEDED FOR WHEEZE OR SHORTNESS OF BREATH     Pulmonology:  Beta Agonists 2 Passed - 04/02/2021  2:52 AM      Passed - Last BP in normal range    BP Readings from Last 1 Encounters:  03/11/21 (!) 127/57          Passed - Last Heart Rate in normal range    Pulse Readings from Last 1 Encounters:  03/11/21 87          Passed - Valid encounter within last 12 months    Recent Outpatient Visits           3 weeks ago Hyponatremia   [facility] [place], Coralie Keens, NP   8 months ago Urinary urgency   [facility] [place], [place], NP   10 months ago Type 2 diabetes mellitus with hyperglycemia, without long-term current use of insulin [facility])   [facility] Kathrine Haddock, NP   1 year ago [place], FNP   1 year ago Type 2 diabetes mellitus with hyperglycemia, without long-term current use of insulin [facility])   [facility], Lupita Raider, FNP       Future Appointments             In 2 months [facility], [facility]

## 2021-04-24 ENCOUNTER — Other Ambulatory Visit: Payer: Self-pay | Admitting: Family Medicine

## 2021-04-24 ENCOUNTER — Other Ambulatory Visit: Payer: Self-pay | Admitting: Internal Medicine

## 2021-04-24 DIAGNOSIS — E1129 Type 2 diabetes mellitus with other diabetic kidney complication: Secondary | ICD-10-CM

## 2021-04-24 DIAGNOSIS — E782 Mixed hyperlipidemia: Secondary | ICD-10-CM

## 2021-04-24 DIAGNOSIS — E1165 Type 2 diabetes mellitus with hyperglycemia: Secondary | ICD-10-CM

## 2021-04-24 NOTE — Telephone Encounter (Signed)
Requested medication (s) are due for refill today: yes  Requested medication (s) are on the active medication list: yes  Last refill:  04/26/20 #90 3 refills  Future visit scheduled: yes in 1 month  Notes to clinic:  last BP 127/57 03/11/21 do you want to refill Rx?     Requested Prescriptions  Pending Prescriptions Disp Refills   lisinopril (ZESTRIL) 5 MG tablet [Pharmacy Med Name: LISINOPRIL 5 MG TABLET] 90 tablet 3    Sig: Take 1 tablet (5 mg total) by mouth daily.     Cardiovascular:  ACE Inhibitors Failed - 04/24/2021  1:59 AM      Failed - Cr in normal range and within 180 days    Creat  Date Value Ref Range Status  03/11/2021 0.48 (L) 0.60 - 1.00 mg/dL Final   Creatinine,U  Date Value Ref Range Status  12/13/2015 153.6 mg/dL Final          Passed - K in normal range and within 180 days    Potassium  Date Value Ref Range Status  03/11/2021 5.0 3.5 - 5.3 mmol/L Final          Passed - Patient is not pregnant      Passed - Last BP in normal range    BP Readings from Last 1 Encounters:  03/11/21 (!) 127/57          Passed - Valid encounter within last 6 months    Recent Outpatient Visits           1 month ago Hyponatremia   [place], Coralie Keens, NP   9 months ago Urinary urgency   [facility] [place], [place], NP   10 months ago Type 2 diabetes mellitus with hyperglycemia, without long-term current use of insulin [facility])   [facility] Kathrine Haddock, NP   1 year ago [place], FNP   1 year ago Type 2 diabetes mellitus with hyperglycemia, without long-term current use of insulin [facility])   [facility], Lupita Raider, FNP       Future Appointments             In 1 month [facility], [facility]

## 2021-04-24 NOTE — Telephone Encounter (Signed)
Requested Prescriptions  Pending Prescriptions Disp Refills   ezetimibe (ZETIA) 10 MG tablet [Pharmacy Med Name: EZETIMIBE 10 MG TABLET] 90 tablet 1    Sig: TAKE 1 TABLET BY MOUTH EVERY DAY     Cardiovascular:  Antilipid - Sterol Transport Inhibitors Failed - 04/24/2021  2:00 AM      Failed - Lipid Panel in normal range within the last 12 months    Cholesterol  Date Value Ref Range Status  06/05/2020 110 <200 mg/dL Final   LDL Cholesterol (Calc)  Date Value Ref Range Status  06/05/2020 55 mg/dL (calc) Final    Comment:    Reference range: <100 . Desirable range <100 mg/dL for primary prevention;   <70 mg/dL for patients with CHD or diabetic patients  with > or = 2 CHD risk factors. Marland Kitchen LDL-C is now calculated using the Martin-Hopkins  calculation, which is a validated novel method providing  better accuracy than the Friedewald equation in the  estimation of LDL-C.  Cresenciano Genre et al. Annamaria Helling. 1914;782(95): 2061-2068  (http://education.[employer].com/faq/FAQ164)    Direct LDL  Date Value Ref Range Status  12/13/2015 115.0 mg/dL Final    Comment:    Optimal:  <100 mg/dLNear or Above Optimal:  100-129 mg/dLBorderline High:  130-159 mg/dLHigh:  160-189 mg/dLVery High:  >190 mg/dL   HDL  Date Value Ref Range Status  06/05/2020 32 (L) > OR = 50 mg/dL Final   Triglycerides  Date Value Ref Range Status  06/05/2020 145 <150 mg/dL Final         Passed - AST in normal range and within 360 days    AST  Date Value Ref Range Status  03/06/2021 17 15 - 41 U/L Final         Passed - ALT in normal range and within 360 days    ALT  Date Value Ref Range Status  03/06/2021 17 0 - 44 U/L Final         Passed - Patient is not pregnant      Passed - Valid encounter within last 12 months    Recent Outpatient Visits          1 month ago Hyponatremia   [place], Coralie Keens, NP   9 months ago Urinary urgency   [facility] [place], Coralie Keens,  NP   10 months ago Type 2 diabetes mellitus with hyperglycemia, without long-term current use of insulin [facility]   [facility] Kathrine Haddock, NP   1 year ago [place], FNP   1 year ago Type 2 diabetes mellitus with hyperglycemia, without long-term current use of insulin [facility])   [facility], Lupita Raider, FNP      Future Appointments            In 1 month [facility], [facility]

## 2021-05-02 ENCOUNTER — Other Ambulatory Visit: Payer: Self-pay | Admitting: Internal Medicine

## 2021-05-02 DIAGNOSIS — E1165 Type 2 diabetes mellitus with hyperglycemia: Secondary | ICD-10-CM

## 2021-05-02 NOTE — Telephone Encounter (Signed)
Requested medication (s) are due for refill today: yes ? ?Requested medication (s) are on the active medication list: yes ? ?Last refill:  08/02/20 #90 2 refills ? ?Future visit scheduled: yes in 1 month ? ?Notes to clinic:  protocol failed last labs 06/04/20. Do you want to refill Rx? ? ? ?  ?Requested Prescriptions  ?Pending Prescriptions Disp Refills  ? glimepiride (AMARYL) 2 MG tablet [Pharmacy Med Name: GLIMEPIRIDE 2 MG TABLET] 90 tablet 2  ?  Sig: TAKE 1 TABLET BY MOUTH EVERY DAY WITH BREAKFAST  ?  ? Endocrinology:  Diabetes - Sulfonylureas Failed - 05/02/2021  8:12 AM  ?  ?  Failed - HBA1C is between 0 and 7.9 and within 180 days  ?  Hemoglobin A1C  ?Date Value Ref Range Status  ?06/04/2020 7.0 (A) 4.0 - 5.6 % Final  ? ?Hgb A1c MFr Bld  ?Date Value Ref Range Status  ?08/15/2018 6.1 (H) <5.7 % of total Hgb Final  ?  Comment:  ?  For someone without known diabetes, a hemoglobin  ?A1c value between 5.7% and 6.4% is consistent with ?prediabetes and should be confirmed with a  ?follow-up test. ?. ?For someone with known diabetes, a value <7% ?indicates that their diabetes is well controlled. A1c ?targets should be individualized based on duration of ?diabetes, age, comorbid conditions, and other ?considerations. ?. ?This assay result is consistent with an increased risk ?of diabetes. ?. ?Currently, no consensus exists regarding use of ?hemoglobin A1c for diagnosis of diabetes for children. ?. ?  ?  ?  ?  ?  Failed - Cr in normal range and within 360 days  ?  Creat  ?Date Value Ref Range Status  ?03/11/2021 0.48 (L) 0.60 - 1.00 mg/dL Final  ? ?Creatinine,U  ?Date Value Ref Range Status  ?12/13/2015 153.6 mg/dL Final  ?  ?  ?  ?  Passed - Valid encounter within last 6 months  ?  Recent Outpatient Visits   ? ?      ? 1 month ago Hyponatremia  ? [facility] [place], [place], NP  ? 9 months ago Urinary urgency  ? [facility] [place], [place], NP  ? 11 months ago Type 2 diabetes mellitus with  hyperglycemia, without long-term current use of insulin ([place])  ? [facility] Kathrine Haddock, NP  ? 1 year ago Dysuria  ? [place], FNP  ? 1 year ago Type 2 diabetes mellitus with hyperglycemia, without long-term current use of insulin ([place])  ? [facility], Lupita Raider, FNP  ? ?  ?  ?Future Appointments   ? ?        ? In 1 month [place]   ? ?  ? ?  ?  ?  ? ?

## 2021-06-03 ENCOUNTER — Ambulatory Visit: Payer: Medicare HMO

## 2021-06-05 ENCOUNTER — Ambulatory Visit (INDEPENDENT_AMBULATORY_CARE_PROVIDER_SITE_OTHER): Payer: Medicare HMO

## 2021-06-05 VITALS — BP 115/57 | HR 68 | Temp 98.5°F | Resp 18 | Ht 64.0 in | Wt 184.0 lb

## 2021-06-05 DIAGNOSIS — Z0000 Encounter for general adult medical examination without abnormal findings: Secondary | ICD-10-CM

## 2021-06-05 NOTE — Progress Notes (Addendum)
? ?Subjective:  ? Maria Sloan is a 71 y.o. female who presents for Medicare Annual (Subsequent) preventive examination. ? ?Review of Systems    ?Per HPI unless specifically indicated below ?  ? ?   ?Objective:  ?  ?Today's Vitals  ? 06/05/21 1016  ?BP: (!) 115/57  ?Pulse: 68  ?Resp: 18  ?Temp: 98.5 ?F (36.9 ?C)  ?TempSrc: Oral  ?SpO2: 98%  ?Weight: 184 lb (83.5 kg)  ?Height: '5\' 4"'  (1.626 m)  ? ?Body mass index is 31.58 kg/m?. ? ? ?  06/05/2021  ? 10:23 AM 03/06/2021  ?  9:22 AM 05/28/2020  ?  9:42 AM 05/23/2019  ?  9:22 AM 04/18/2018  ? 10:18 AM 02/07/2015  ?  4:09 PM  ?Advanced Directives  ?Does Patient Have a Medical Advance Directive? No No No No  No  ?Would patient like information on creating a medical advance directive? No - Patient declined No - Patient declined   No - Patient declined   ? ? ?Current Medications (verified) ?Outpatient Encounter Medications as of 06/05/2021  ?Medication Sig  ? Accu-Chek Softclix Lancets lancets ONE TIME DAILY AS DIRECTED. FOR ICD-10 E10.9, E11.9 A1C 02/11/2018- 11.8%  ? atorvastatin (LIPITOR) 20 MG tablet TAKE 1 TABLET BY MOUTH EVERY DAY  ? Blood Glucose Monitoring Suppl (ACCU-CHEK AVIVA PLUS) w/Device KIT 1 Device by Does not apply route as directed. . One time daily as directed. FOR ICD-10 E10.9, E11.9 A1C 02/11/2018- 11.8%  ? busPIRone (BUSPAR) 5 MG tablet TAKE 1 TABLET BY MOUTH EVERYDAY AT BEDTIME  ? CALCIUM CITRATE PO Take 1 tablet by mouth daily.   ? Cholecalciferol (VITAMIN D PO) Take 1 tablet by mouth daily.   ? citalopram (CELEXA) 20 MG tablet TAKE 1 TABLET BY MOUTH EVERY DAY  ? ezetimibe (ZETIA) 10 MG tablet TAKE 1 TABLET BY MOUTH EVERY DAY  ? fluticasone (FLONASE) 50 MCG/ACT nasal spray PLACE 2 SPRAYS INTO BOTH NOSTRILS DAILY. USE FOR 4-6 WEEKS THEN STOP AND USE AS NEEDED  ? glimepiride (AMARYL) 2 MG tablet TAKE 1 TABLET BY MOUTH EVERY DAY WITH BREAKFAST  ? glucose blood (ACCU-CHEK AVIVA PLUS) test strip USE UP TO ONE TIME DAILY AS DIRECTED. FOR ICD-10 E10.9, E11.9 A1C  02/11/2018- 11.8%  ? lisinopril (ZESTRIL) 5 MG tablet TAKE 1 TABLET (5 MG TOTAL) BY MOUTH DAILY.  ? meloxicam (MOBIC) 15 MG tablet Take 15 mg by mouth daily.   ? metFORMIN (GLUCOPHAGE-XR) 500 MG 24 hr tablet Take 1 tablet (500 mg total) by mouth daily with breakfast.  ? methocarbamol (ROBAXIN) 500 MG tablet Take 500 mg by mouth at bedtime.   ? Multiple Vitamin (MULTIVITAMIN) tablet Take 1 tablet by mouth daily.  ? Omega-3 Fatty Acids (FISH OIL) 1000 MG CAPS Take 1,000 mg by mouth 3 (three) times daily.  ? Potassium 75 MG TABS Take 1 tablet by mouth daily.  ? [DISCONTINUED] albuterol (VENTOLIN HFA) 108 (90 Base) MCG/ACT inhaler Inhale 2 puffs into the lungs every 6 (six) hours as needed for wheezing or shortness of breath. (Patient not taking: Reported on 06/05/2021)  ? [DISCONTINUED] azithromycin (ZITHROMAX) 250 MG tablet Take 2 tabs today, then 1 tab daily x 4 days (Patient not taking: Reported on 06/05/2021)  ? [DISCONTINUED] HYDROcodone bit-homatropine (HYCODAN) 5-1.5 MG/5ML syrup Take 5 mLs by mouth every 8 (eight) hours as needed for cough. (Patient not taking: Reported on 06/05/2021)  ? [DISCONTINUED] predniSONE (DELTASONE) 10 MG tablet Take 6 tabs on day 1, 5 tabs on day 2,  4 tabs on day 3, 3 tabs on day 4, 2 tabs on day 5, 1 tab on day 6 (Patient not taking: Reported on 06/05/2021)  ? ?No facility-administered encounter medications on file as of 06/05/2021.  ? ? ?Allergies (verified) ?Prochlorperazine, Other, Actifed cold-allergy [chlorpheniramine-phenylephrine], and Penicillins  ? ?History: ?Past Medical History:  ?Diagnosis Date  ? Anxiety   ? Arthritis   ? Cervical cancer ([place])   ? Hyperlipidemia   ? PVD (peripheral vascular disease) ([place])   ? R leg venous insufficiency  ? PVD (peripheral vascular disease) ([place])   ? Shingles   ? Type 2 diabetes mellitus ([place])   ? ?Past Surgical History:  ?Procedure Laterality Date  ? PARTIAL HYSTERECTOMY    ? ovaries remain  ? ROTATOR CUFF REPAIR Right   ? ?Family History  ?Problem  Relation Age of Onset  ? Emphysema Father   ? Lung cancer Father   ? Hyperlipidemia Father   ? Stroke Mother   ? Hyperlipidemia Sister   ? Healthy Son   ? ?Social History  ? ?Socioeconomic History  ? Marital status: Divorced  ?  Spouse name: Not on file  ? Number of children: Not on file  ? Years of education: Not on file  ? Highest education level: Bachelor's degree (e.g., BA, AB, BS)  ?Occupational History  ? Occupation: retired   ?Tobacco Use  ? Smoking status: Former  ?  Packs/day: 1.00  ?  Years: 25.00  ?  Pack years: 25.00  ?  Types: Cigarettes  ?  Quit date: 11/29/1994  ?  Years since quitting: 26.5  ? Smokeless tobacco: Never  ?Vaping Use  ? Vaping Use: Never used  ?Substance and Sexual Activity  ? Alcohol use: Yes  ?  Alcohol/week: 0.0 standard drinks  ?  Comment: occasionally  ? Drug use: Never  ? Sexual activity: Not Currently  ?  Birth control/protection: None  ?Other Topics Concern  ? Not on file  ?Social History Narrative  ? Divorced.  ? 1 son.   ? Moved from [place].  ? Enjoys making jewelry, spending time with family, crafts, reading.  ? ?Social Determinants of Health  ? ?Financial Resource Strain: Low Risk   ? Difficulty of Paying Living Expenses: Not hard at all  ?Food Insecurity: No Food Insecurity  ? Worried About Charity fundraiser in the Last Year: Never true  ? Ran Out of Food in the Last Year: Never true  ?Transportation Needs: No Transportation Needs  ? Lack of Transportation (Medical): No  ? Lack of Transportation (Non-Medical): No  ?Physical Activity: Insufficiently Active  ? Days of Exercise per Week: 3 days  ? Minutes of Exercise per Session: 30 min  ?Stress: No Stress Concern Present  ? Feeling of Stress : Only a little  ?Social Connections: Socially Isolated  ? Frequency of Communication with Friends and Family: More than three times a week  ? Frequency of Social Gatherings with Friends and Family: More than three times a week  ? Attends Religious Services: Never  ? Active Member of  Clubs or Organizations: No  ? Attends Archivist Meetings: Never  ? Marital Status: Divorced  ? ? ?Tobacco Counseling ?Counseling given: Not Answered ? ? ?Clinical Intake: ? ?Pre-visit preparation completed: No ? ?Pain : No/denies pain ? ?  ? ?Nutritional Status: BMI > 30  Obese ?Nutritional Risks: None ?Diabetes: Yes ?CBG done?: No ?Did pt. bring in CBG monitor from home?: No ?Glucose Meter Downloaded?:  No ? ?How often do you need to have someone help you when you read instructions, pamphlets, or other written materials from your doctor or pharmacy?: 1 - Never ? ?Diabetic? Yes ? ?Interpreter Needed?: No ? ?  ? ? ?Activities of Daily Living ? ?  06/05/2021  ? 10:18 AM 03/11/2021  ?  8:26 AM  ?In your present state of health, do you have any difficulty performing the following activities:  ?Hearing? 1 1  ?Vision? 1 0  ?Difficulty concentrating or making decisions? 1 0  ?Walking or climbing stairs? 0 0  ?Dressing or bathing? 0 0  ?Doing errands, shopping? 0 0  ?Preparing Food and eating ? Y   ?Using the Toilet? Y   ?In the past six months, have you accidently leaked urine? Y   ?Do you have problems with loss of bowel control? N   ?Managing your Medications? Y   ?Managing your Finances? Y   ?Housekeeping or managing your Housekeeping? Y   ? ? ?Patient Care Team: ?Jearld Fenton, NP as PCP - General (Internal Medicine) ? ?Indicate any recent Medical Services you may have received from other than [facility] providers in the past year (date may be approximate). ? ?   ?Assessment:  ? This is a routine wellness examination for Maria Sloan. ? ?Hearing/Vision screen ?Vision Screening  ? Right eye Left eye Both eyes  ?Without correction     ?With correction '20/20 20/25 20/15 '  ? ? ?Dietary issues and exercise activities discussed: ?Current Exercise Habits: Home exercise routine, Type of exercise: walking;Other - see comments, Time (Minutes): 30, Frequency (Times/Week): 3, Weekly Exercise (Minutes/Week): 90, Intensity: Moderate ? ?  Goals Addressed   ? ?  ?  ?  ?  ? This Visit's Progress  ?  Have 3 meals a day     ? ?  ? ?Depression Screen ? ?  06/05/2021  ? 10:18 AM 03/11/2021  ?  8:26 AM 06/04/2020  ?  9:50 AM 05/28/2020  ?  9:43 AM 08/02/18

## 2021-06-05 NOTE — Patient Instructions (Signed)
Screening for Type 2 Diabetes ?A screening test for type 2 diabetes (type 2 diabetes mellitus) is a blood test to measure your blood sugar (glucose) level. This test is done to check for early signs of diabetes, before you develop symptoms.  ?Type 2 diabetes is a long-term (chronic) disease. In type 2 diabetes, one or both of these problems may be present: ?The pancreas does not make enough of a hormone called insulin. ?Cells in the body do not respond properly to insulin that the body makes (insulin resistance). ?Normally, insulin allows blood sugar (glucose) to enter cells in the body. The cells use glucose for energy. Insulin resistance or lack of insulin causes excess glucose to build up in the blood instead of going into cells. This results in high blood glucose levels (hyperglycemia), which can cause many complications. ?You may be screened for type 2 diabetes as part of your regular health care, especially if you have a high risk for diabetes. Screening can help to identify type 2 diabetes at its early stage (prediabetes). Identifying and treating prediabetes may delay or prevent the development of type 2 diabetes. ?Tell a health care provider about: ?All medicines you are taking, including vitamins, herbs, eye drops, creams, and over-the-counter medicines. ?Any bleeding problems you have. ?Any medical conditions you have. ?Whether you are pregnant or may be pregnant. ?Who should be screened for type 2 diabetes? ?Adults ?Adults age 1 and older. These adults should be screened once every three years. ?Adults who are any age, are overweight, and have one other risk factor. These adults should be screened once every three years. ?Adults who have normal blood glucose levels and two or more risk factors. These adults may be screened once every year (annually). ?Women who have had gestational diabetes in the past. These women should be screened once every three years. ?Pregnant women who have risk factors. These  women should be screened at their first prenatal visit and again between weeks 24 and 28 of pregnancy. ?Children and adolescents ?Children and adolescents should be screened for type 2 diabetes if they are overweight and have any of the following risk factors: ?A family history of type 2 diabetes. ?Being a member of a high-risk ethnic group. ?Signs of insulin resistance or conditions that are associated with insulin resistance. ?A mother who had gestational diabetes while pregnant. ?Screening should be done at least once every three years, starting at age 31 or at the onset of puberty, whichever comes first. ?Your health care provider or your child's health care provider may recommend having a screening more or less often. ?What are the risk factors for type 2 diabetes? ?The following are factors that may make you more likely to develop type 2 diabetes and can be modified: ?Not getting enough exercise. ?Having high blood pressure. ?Having low levels of good cholesterol (HDL-C) or high levels of blood fats (triglycerides). ?Having high blood glucose in a previous blood test. ?Being overweight or obese. ?The following are factors that may make you more likely to develop type 2 diabetes and can not be modified: ?Having a parent or sibling (first-degree relative) who has diabetes. ?Being of American-Indian, African-American, Hispanic/Latino, Asian, or [place] descent. ?Being older than age 59. ?Having a history of diabetes during pregnancy (gestational diabetes). ?Having certain diseases or conditions that may be caused by insulin resistance, including: ?Acanthosis nigricans. This is a condition that causes dark skin on the neck, armpits, and groin. ?Polycystic ovary syndrome (PCOS). ?Cardiovascular heart disease. ?What happens during  screening? ?During screening, your health care provider may ask questions about: ?Your health and your risk factors, including your activity level and any medical conditions that  you have. ?The health of your first-degree relatives. ?Past pregnancies, if this applies. ?Your health care provider will also do a physical exam, including a blood pressure measurement and blood tests. There are four blood tests that can be used to screen for type 2 diabetes. You may have one or more of the following: ?A fasting blood glucose (FBG) test. You will not be allowed to eat (you will fast) for 8 hours or more before a blood sample is taken. ?A random blood glucose test. This test checks your blood glucose at any time of the day regardless of when you ate. ?An oral glucose tolerance test (OGTT). This test measures your blood glucose at two times: ?After you have not eaten (have fasted) overnight. This is your baseline glucose level. ?Two hours after you drink a glucose-containing beverage. ?An A1C (hemoglobin A1C) blood test. This test provides information about blood glucose control over the previous 2-3 months. ?What do the results mean? ?Your test results are a measurement of how much glucose is in your blood. Normal blood glucose levels mean that you do not have diabetes or prediabetes. High blood glucose levels may mean that you have prediabetes or diabetes. Depending on the results, other tests may be needed to confirm the diagnosis. ?You may be diagnosed with type 2 diabetes if: ?Your FBG level is 126 mg/dL (7.0 mmol/L) or higher. ?Your random blood glucose level is 200 mg/dL (11.1 mmol/L) or higher. ?Your A1C level is 6.5% or higher. ?Your OGTT result is higher than 200 mg/dL (11.1 mmol/L). ?These blood tests may be repeated to confirm your diagnosis. Talk with your health care provider about what your results mean. ?Summary ?A screening test for type 2 diabetes (type 2 diabetes mellitus) is a blood test to measure your blood sugar (glucose) level. ?Know what your risk factors are for developing type 2 diabetes. ?If you are at risk, get screening tests as often as told by your health care  provider. ?Screening may help you identify type 2 diabetes at its early stage (prediabetes). Identifying and treating prediabetes may delay or prevent the development of type 2 diabetes. ?This information is not intended to replace advice given to you by your health care provider. Make sure you discuss any questions you have with your health care provider. ?Document Revised: 05/13/2020 Document Reviewed: 05/13/2020 ?Elsevier Patient Education ? [place]. ? ?Health Maintenance, Female ?Adopting a healthy lifestyle and getting preventive care are important in promoting health and wellness. Ask your health care provider about: ?The right schedule for you to have regular tests and exams. ?Things you can do on your own to prevent diseases and keep yourself healthy. ?What should I know about diet, weight, and exercise? ?Eat a healthy diet ? ?Eat a diet that includes plenty of vegetables, fruits, low-fat dairy products, and lean protein. ?Do not eat a lot of foods that are high in solid fats, added sugars, or sodium. ?Maintain a healthy weight ?Body mass index (BMI) is used to identify weight problems. It estimates body fat based on height and weight. Your health care provider can help determine your BMI and help you achieve or maintain a healthy weight. ?Get regular exercise ?Get regular exercise. This is one of the most important things you can do for your health. Most adults should: ?Exercise for at least 150 minutes  each week. The exercise should increase your heart rate and make you sweat (moderate-intensity exercise). ?Do strengthening exercises at least twice a week. This is in addition to the moderate-intensity exercise. ?Spend less time sitting. Even light physical activity can be beneficial. ?Watch cholesterol and blood lipids ?Have your blood tested for lipids and cholesterol at 71 years of age, then have this test every 5 years. ?Have your cholesterol levels checked more often if: ?Your lipid or  cholesterol levels are high. ?You are older than 71 years of age. ?You are at high risk for heart disease. ?What should I know about cancer screening? ?Depending on your health history and family history, you may ne

## 2021-06-25 ENCOUNTER — Other Ambulatory Visit: Payer: Self-pay | Admitting: Unknown Physician Specialty

## 2021-06-26 ENCOUNTER — Encounter: Payer: Self-pay | Admitting: Internal Medicine

## 2021-06-26 ENCOUNTER — Ambulatory Visit (INDEPENDENT_AMBULATORY_CARE_PROVIDER_SITE_OTHER): Payer: Medicare HMO | Admitting: Internal Medicine

## 2021-06-26 VITALS — BP 114/62 | HR 70 | Temp 97.1°F | Ht 65.0 in | Wt 183.0 lb

## 2021-06-26 DIAGNOSIS — R42 Dizziness and giddiness: Secondary | ICD-10-CM

## 2021-06-26 DIAGNOSIS — Z0001 Encounter for general adult medical examination with abnormal findings: Secondary | ICD-10-CM

## 2021-06-26 DIAGNOSIS — Z1231 Encounter for screening mammogram for malignant neoplasm of breast: Secondary | ICD-10-CM

## 2021-06-26 DIAGNOSIS — Z780 Asymptomatic menopausal state: Secondary | ICD-10-CM | POA: Diagnosis not present

## 2021-06-26 DIAGNOSIS — E1165 Type 2 diabetes mellitus with hyperglycemia: Secondary | ICD-10-CM | POA: Diagnosis not present

## 2021-06-26 DIAGNOSIS — E6609 Other obesity due to excess calories: Secondary | ICD-10-CM

## 2021-06-26 DIAGNOSIS — E663 Overweight: Secondary | ICD-10-CM | POA: Insufficient documentation

## 2021-06-26 DIAGNOSIS — Z6830 Body mass index (BMI) 30.0-30.9, adult: Secondary | ICD-10-CM | POA: Diagnosis not present

## 2021-06-26 DIAGNOSIS — Z6827 Body mass index (BMI) 27.0-27.9, adult: Secondary | ICD-10-CM | POA: Insufficient documentation

## 2021-06-26 NOTE — Patient Instructions (Signed)
Health Maintenance for Postmenopausal Women Menopause is a normal process in which your ability to get pregnant comes to an end. This process happens slowly over many months or years, usually between the ages of 48 and 55. Menopause is complete when you have missed your menstrual period for 12 months. It is important to talk with your health care provider about some of the most common conditions that affect women after menopause (postmenopausal women). These include heart disease, cancer, and bone loss (osteoporosis). Adopting a healthy lifestyle and getting preventive care can help to promote your health and wellness. The actions you take can also lower your chances of developing some of these common conditions. What are the signs and symptoms of menopause? During menopause, you may have the following symptoms: Hot flashes. These can be moderate or severe. Night sweats. Decrease in sex drive. Mood swings. Headaches. Tiredness (fatigue). Irritability. Memory problems. Problems falling asleep or staying asleep. Talk with your health care provider about treatment options for your symptoms. Do I need hormone replacement therapy? Hormone replacement therapy is effective in treating symptoms that are caused by menopause, such as hot flashes and night sweats. Hormone replacement carries certain risks, especially as you become older. If you are thinking about using estrogen or estrogen with progestin, discuss the benefits and risks with your health care provider. How can I reduce my risk for heart disease and stroke? The risk of heart disease, heart attack, and stroke increases as you age. One of the causes may be a change in the body's hormones during menopause. This can affect how your body uses dietary fats, triglycerides, and cholesterol. Heart attack and stroke are medical emergencies. There are many things that you can do to help prevent heart disease and stroke. Watch your blood pressure High  blood pressure causes heart disease and increases the risk of stroke. This is more likely to develop in people who have high blood pressure readings or are overweight. Have your blood pressure checked: Every 3-5 years if you are 18-39 years of age. Every year if you are 40 years old or older. Eat a healthy diet  Eat a diet that includes plenty of vegetables, fruits, low-fat dairy products, and lean protein. Do not eat a lot of foods that are high in solid fats, added sugars, or sodium. Get regular exercise Get regular exercise. This is one of the most important things you can do for your health. Most adults should: Try to exercise for at least 150 minutes each week. The exercise should increase your heart rate and make you sweat (moderate-intensity exercise). Try to do strengthening exercises at least twice each week. Do these in addition to the moderate-intensity exercise. Spend less time sitting. Even light physical activity can be beneficial. Other tips Work with your health care provider to achieve or maintain a healthy weight. Do not use any products that contain nicotine or tobacco. These products include cigarettes, chewing tobacco, and vaping devices, such as e-cigarettes. If you need help quitting, ask your health care provider. Know your numbers. Ask your health care provider to check your cholesterol and your blood sugar (glucose). Continue to have your blood tested as directed by your health care provider. Do I need screening for cancer? Depending on your health history and family history, you may need to have cancer screenings at different stages of your life. This may include screening for: Breast cancer. Cervical cancer. Lung cancer. Colorectal cancer. What is my risk for osteoporosis? After menopause, you may be   at increased risk for osteoporosis. Osteoporosis is a condition in which bone destruction happens more quickly than new bone creation. To help prevent osteoporosis or  the bone fractures that can happen because of osteoporosis, you may take the following actions: If you are 19-50 years old, get at least 1,000 mg of calcium and at least 600 international units (IU) of vitamin D per day. If you are older than age 50 but younger than age 70, get at least 1,200 mg of calcium and at least 600 international units (IU) of vitamin D per day. If you are older than age 70, get at least 1,200 mg of calcium and at least 800 international units (IU) of vitamin D per day. Smoking and drinking excessive alcohol increase the risk of osteoporosis. Eat foods that are rich in calcium and vitamin D, and do weight-bearing exercises several times each week as directed by your health care provider. How does menopause affect my mental health? Depression may occur at any age, but it is more common as you become older. Common symptoms of depression include: Feeling depressed. Changes in sleep patterns. Changes in appetite or eating patterns. Feeling an overall lack of motivation or enjoyment of activities that you previously enjoyed. Frequent crying spells. Talk with your health care provider if you think that you are experiencing any of these symptoms. General instructions See your health care provider for regular wellness exams and vaccines. This may include: Scheduling regular health, dental, and eye exams. Getting and maintaining your vaccines. These include: Influenza vaccine. Get this vaccine each year before the flu season begins. Pneumonia vaccine. Shingles vaccine. Tetanus, diphtheria, and pertussis (Tdap) booster vaccine. Your health care provider may also recommend other immunizations. Tell your health care provider if you have ever been abused or do not feel safe at home. Summary Menopause is a normal process in which your ability to get pregnant comes to an end. This condition causes hot flashes, night sweats, decreased interest in sex, mood swings, headaches, or lack  of sleep. Treatment for this condition may include hormone replacement therapy. Take actions to keep yourself healthy, including exercising regularly, eating a healthy diet, watching your weight, and checking your blood pressure and blood sugar levels. Get screened for cancer and depression. Make sure that you are up to date with all your vaccines. This information is not intended to replace advice given to you by your health care provider. Make sure you discuss any questions you have with your health care provider. Document Revised: 07/08/2020 Document Reviewed: 07/08/2020 Elsevier Patient Education  2023 [employer] Inc[employer]  

## 2021-06-26 NOTE — Assessment & Plan Note (Signed)
She controls this using the Epley maneuver ?Advised her to try Meclizine 25 mg every 8 hours as needed OTC ?

## 2021-06-26 NOTE — Assessment & Plan Note (Signed)
Encourage diet and exercise for weight loss 

## 2021-06-26 NOTE — Telephone Encounter (Signed)
Requested medication (s) are due for refill today: Yes ? ?Requested medication (s) are on the active medication list: Yes ? ?Last refill:  06/04/20 ? ?Future visit scheduled: Yes ? ?Notes to clinic:  Prescription expired. ? ? ? ?Requested Prescriptions  ?Pending Prescriptions Disp Refills  ? metFORMIN (GLUCOPHAGE-XR) 500 MG 24 hr tablet [Pharmacy Med Name: METFORMIN HCL ER 500 MG TABLET] 90 tablet 2  ?  Sig: TAKE 1 TABLET BY MOUTH EVERY DAY WITH BREAKFAST  ?  ? Endocrinology:  Diabetes - Biguanides Failed - 06/25/2021  2:38 AM  ?  ?  Failed - Cr in normal range and within 360 days  ?  Creat  ?Date Value Ref Range Status  ?03/11/2021 0.48 (L) 0.60 - 1.00 mg/dL Final  ? ?Creatinine,U  ?Date Value Ref Range Status  ?12/13/2015 153.6 mg/dL Final  ?  ?  ?  ?  Failed - HBA1C is between 0 and 7.9 and within 180 days  ?  Hemoglobin A1C  ?Date Value Ref Range Status  ?06/04/2020 7.0 (A) 4.0 - 5.6 % Final  ? ?Hgb A1c MFr Bld  ?Date Value Ref Range Status  ?08/15/2018 6.1 (H) <5.7 % of total Hgb Final  ?  Comment:  ?  For someone without known diabetes, a hemoglobin  ?A1c value between 5.7% and 6.4% is consistent with ?prediabetes and should be confirmed with a  ?follow-up test. ?. ?For someone with known diabetes, a value <7% ?indicates that their diabetes is well controlled. A1c ?targets should be individualized based on duration of ?diabetes, age, comorbid conditions, and other ?considerations. ?. ?This assay result is consistent with an increased risk ?of diabetes. ?. ?Currently, no consensus exists regarding use of ?hemoglobin A1c for diagnosis of diabetes for children. ?. ?  ?  ?  ?  ?  Failed - B12 Level in normal range and within 720 days  ?  No results found for: VITAMINB12  ?  ?  ?  Passed - eGFR in normal range and within 360 days  ?  GFR, Est African American  ?Date Value Ref Range Status  ?02/11/2018 109 > OR = 60 mL/min/1.73m Final  ? ?GFR, Est Non African American  ?Date Value Ref Range Status  ?02/11/2018 94 > OR  = 60 mL/min/1.778mFinal  ? ?GFR, Estimated  ?Date Value Ref Range Status  ?03/06/2021 >60 >60 mL/min Final  ?  Comment:  ?  (NOTE) ?Calculated using the CKD-EPI Creatinine Equation (2021) ?  ? ?GFR  ?Date Value Ref Range Status  ?07/12/2015 87.75 >60.00 mL/min Final  ?  ?  ?  ?  Passed - Valid encounter within last 6 months  ?  Recent Outpatient Visits   ? ?      ? Today Encounter for general adult medical examination with abnormal findings  ? [facility]  ? 3 months ago Hyponatremia  ? [facility]  ? 11 months ago Urinary urgency  ? [facility]  ? 1 year ago Type 2 diabetes mellitus with hyperglycemia, without long-term current use of insulin ([place] ? [facility] Haddock[facility]  ? 1 year ago Dysuria  ? SoColemanFNP  ? ?  ?  ? ? ?  ?  ?  Passed - CBC within normal limits and completed in the last 12 months  ?  WBC  ?  Date Value Ref Range Status  ?03/11/2021 10.1 3.8 - 10.8 Thousand/uL Final  ? ?RBC  ?Date Value Ref Range Status  ?03/11/2021 4.28 3.80 - 5.10 Million/uL Final  ? ?Hemoglobin  ?Date Value Ref Range Status  ?03/11/2021 12.8 11.7 - 15.5 g/dL Final  ? ?HCT  ?Date Value Ref Range Status  ?03/11/2021 38.7 35.0 - 45.0 % Final  ? ?MCHC  ?Date Value Ref Range Status  ?03/11/2021 33.1 32.0 - 36.0 g/dL Final  ? ?MCH  ?Date Value Ref Range Status  ?03/11/2021 29.9 27.0 - 33.0 pg Final  ? ?MCV  ?Date Value Ref Range Status  ?03/11/2021 90.4 80.0 - 100.0 fL Final  ? ?No results found for: PLTCOUNTKUC, LABPLAT, [place] ?RDW  ?Date Value Ref Range Status  ?03/11/2021 12.7 11.0 - 15.0 % Final  ? ?  ?  ?  ? ?

## 2021-06-26 NOTE — Progress Notes (Signed)
? ?Subjective:  ? ? Patient ID: Maria Sloan, female    DOB: 1950/06/06, 71 y.o.   MRN: [id number] ? ?HPI ? ?Patient presents to clinic today for her annual exam. ? ?Flu: 12/2020 ?Tetanus: 08/2019 ?COVID: Pfizer x3 ?Pneumovax: 08/2019 ?Prevnar: 12/2017 ?Shingrix: 1 dose at [place] ?Pap smear: Partial hysterectomy ?Mammogram: 08/2019 ?Bone density: 04/2015 ?Colon screening: 04/2019, Cologuard ?Vision screening: as needed ?Dentist: as needed ? ?Diet: She does eat meat. She consumes fruits and veggies. She tries to avoid fried foods. She drinks mostly coffee.  ?Exercise: None ? ? ?Review of Systems ? ?   ?Past Medical History:  ?Diagnosis Date  ? Anxiety   ? Arthritis   ? Cervical cancer ([place])   ? Hyperlipidemia   ? PVD (peripheral vascular disease) ([place])   ? R leg venous insufficiency  ? PVD (peripheral vascular disease) ([place])   ? Shingles   ? Type 2 diabetes mellitus ([place])   ? ? ?Current Outpatient Medications  ?Medication Sig Dispense Refill  ? Accu-Chek Softclix Lancets lancets ONE TIME DAILY AS DIRECTED. FOR ICD-10 E10.9, E11.9 A1C 02/11/2018- 11.8% 100 each 12  ? atorvastatin (LIPITOR) 20 MG tablet TAKE 1 TABLET BY MOUTH EVERY DAY 90 tablet 1  ? Blood Glucose Monitoring Suppl (ACCU-CHEK AVIVA PLUS) w/Device KIT 1 Device by Does not apply route as directed. . One time daily as directed. FOR ICD-10 E10.9, E11.9 A1C 02/11/2018- 11.8% 1 kit 0  ? busPIRone (BUSPAR) 5 MG tablet TAKE 1 TABLET BY MOUTH EVERYDAY AT BEDTIME 90 tablet 1  ? CALCIUM CITRATE PO Take 1 tablet by mouth daily.     ? Cholecalciferol (VITAMIN D PO) Take 1 tablet by mouth daily.     ? citalopram (CELEXA) 20 MG tablet TAKE 1 TABLET BY MOUTH EVERY DAY 90 tablet 2  ? ezetimibe (ZETIA) 10 MG tablet TAKE 1 TABLET BY MOUTH EVERY DAY 90 tablet 0  ? fluticasone (FLONASE) 50 MCG/ACT nasal spray PLACE 2 SPRAYS INTO BOTH NOSTRILS DAILY. USE FOR 4-6 WEEKS THEN STOP AND USE AS NEEDED 48 mL 0  ? glimepiride (AMARYL) 2 MG tablet TAKE 1 TABLET BY MOUTH EVERY DAY WITH  BREAKFAST 90 tablet 0  ? glucose blood (ACCU-CHEK AVIVA PLUS) test strip USE UP TO ONE TIME DAILY AS DIRECTED. FOR ICD-10 E10.9, E11.9 A1C 02/11/2018- 11.8% 100 strip 12  ? lisinopril (ZESTRIL) 5 MG tablet TAKE 1 TABLET (5 MG TOTAL) BY MOUTH DAILY. 90 tablet 0  ? meloxicam (MOBIC) 15 MG tablet Take 15 mg by mouth daily.     ? metFORMIN (GLUCOPHAGE-XR) 500 MG 24 hr tablet Take 1 tablet (500 mg total) by mouth daily with breakfast. 60 tablet 3  ? methocarbamol (ROBAXIN) 500 MG tablet Take 500 mg by mouth at bedtime.     ? Multiple Vitamin (MULTIVITAMIN) tablet Take 1 tablet by mouth daily.    ? Omega-3 Fatty Acids (FISH OIL) 1000 MG CAPS Take 1,000 mg by mouth 3 (three) times daily.    ? Potassium 75 MG TABS Take 1 tablet by mouth daily.    ? ?No current facility-administered medications for this visit.  ? ? ?Allergies  ?Allergen Reactions  ? Prochlorperazine Other (See Comments)  ?  Compazine  ? Other   ?  Other reaction(s): Dizziness  ? Actifed Cold-Allergy [Chlorpheniramine-Phenylephrine]   ?  Ok with Robitussin, Mucinex  ? Penicillins   ? ? ?Family History  ?Problem Relation Age of Onset  ? Emphysema Father   ? Lung cancer  Father   ? Hyperlipidemia Father   ? Stroke Mother   ? Hyperlipidemia Sister   ? Healthy Son   ? ? ?Social History  ? ?Socioeconomic History  ? Marital status: Divorced  ?  Spouse name: Not on file  ? Number of children: Not on file  ? Years of education: Not on file  ? Highest education level: Bachelor's degree (e.g., BA, AB, BS)  ?Occupational History  ? Occupation: retired   ?Tobacco Use  ? Smoking status: Former  ?  Packs/day: 1.00  ?  Years: 25.00  ?  Pack years: 25.00  ?  Types: Cigarettes  ?  Quit date: 11/29/1994  ?  Years since quitting: 26.5  ? Smokeless tobacco: Never  ?Vaping Use  ? Vaping Use: Never used  ?Substance and Sexual Activity  ? Alcohol use: Yes  ?  Alcohol/week: 0.0 standard drinks  ?  Comment: occasionally  ? Drug use: Never  ? Sexual activity: Not Currently  ?  Birth  control/protection: None  ?Other Topics Concern  ? Not on file  ?Social History Narrative  ? Divorced.  ? 1 son.   ? Moved from [place].  ? Enjoys making jewelry, spending time with family, crafts, reading.  ? ?Social Determinants of Health  ? ?Financial Resource Strain: Low Risk   ? Difficulty of Paying Living Expenses: Not hard at all  ?Food Insecurity: No Food Insecurity  ? Worried About Charity fundraiser in the Last Year: Never true  ? Ran Out of Food in the Last Year: Never true  ?Transportation Needs: No Transportation Needs  ? Lack of Transportation (Medical): No  ? Lack of Transportation (Non-Medical): No  ?Physical Activity: Insufficiently Active  ? Days of Exercise per Week: 3 days  ? Minutes of Exercise per Session: 30 min  ?Stress: No Stress Concern Present  ? Feeling of Stress : Only a little  ?Social Connections: Socially Isolated  ? Frequency of Communication with Friends and Family: More than three times a week  ? Frequency of Social Gatherings with Friends and Family: More than three times a week  ? Attends Religious Services: Never  ? Active Member of Clubs or Organizations: No  ? Attends Archivist Meetings: Never  ? Marital Status: Divorced  ?Intimate Partner Violence: Not At Risk  ? Fear of Current or Ex-Partner: No  ? Emotionally Abused: No  ? Physically Abused: No  ? Sexually Abused: No  ? ? ? ?Constitutional: Denies fever, malaise, fatigue, headache or abrupt weight changes.  ?HEENT: Denies eye pain, eye redness, ear pain, ringing in the ears, wax buildup, runny nose, nasal congestion, bloody nose, or sore throat. ?Respiratory: Denies difficulty breathing, shortness of breath, cough or sputum production.   ?Cardiovascular: Denies chest pain, chest tightness, palpitations or swelling in the hands or feet.  ?Gastrointestinal: Denies abdominal pain, bloating, constipation, diarrhea or blood in the stool.  ?GU: Denies urgency, frequency, pain with urination, burning sensation, blood in  urine, odor or discharge. ?Musculoskeletal: Patient reports intermittent right knee pain.  Denies decrease in range of motion, difficulty with gait, muscle pain or joint swelling.  ?Skin: Denies redness, rashes, lesions or ulcercations.  ?Neurological: Patient reports intermittent dizziness.  Denies difficulty with memory, difficulty with speech or problems with balance and coordination.  ?Psych: Patient has a history of anxiety and depression.  Denies SI/HI. ? ?No other specific complaints in a complete review of systems (except as listed in HPI above). ? ?Objective:  ?  Physical Exam ? ?BP 114/62 (BP Location: Left Arm, Patient Position: Sitting, Cuff Size: Large)   Pulse 70   Temp (!) 97.1 ?F (36.2 ?C) (Temporal)   Ht '5\' 5"'  (1.651 m)   Wt 183 lb (83 kg)   SpO2 96%   BMI 30.45 kg/m?  ? ?Wt Readings from Last 3 Encounters:  ?06/05/21 184 lb (83.5 kg)  ?03/11/21 181 lb (82.1 kg)  ?03/06/21 185 lb (83.9 kg)  ? ? ?General: Appears her stated age, obese, in NAD. ?Skin: Warm, dry and intact. No ulcerations noted. ?HEENT: Head: normal shape and size; Eyes: sclera white, no icterus, conjunctiva pink, PERRLA and EOMs intact;  ?Neck:  Neck supple, trachea midline. No masses, lumps or thyromegaly present.  ?Cardiovascular: Normal rate and rhythm. S1,S2 noted.  No murmur, rubs or gallops noted. No JVD or BLE edema. No carotid bruits noted. ?Pulmonary/Chest: Normal effort and positive vesicular breath sounds. No respiratory distress. No wheezes, rales or ronchi noted.  ?Abdomen: Soft and nontender. Normal bowel sounds.  ?Musculoskeletal: Strength 5/5 BUE/BLE.  No difficulty with gait.  ?Neurological: Alert and oriented. Cranial nerves II-XII grossly intact. Coordination normal.  ?Psychiatric: Mood and affect normal. Behavior is normal. Judgment and thought content normal.  ? ? ?BMET ?   ?Component Value Date/Time  ? NA 135 03/11/2021 0841  ? K 5.0 03/11/2021 0841  ? CL 98 03/11/2021 0841  ? CO2 28 03/11/2021 0841  ?  GLUCOSE 238 (H) 03/11/2021 0841  ? BUN 9 03/11/2021 0841  ? CREATININE 0.48 (L) 03/11/2021 0841  ? CALCIUM 9.1 03/11/2021 0841  ? GFRNONAA >60 03/06/2021 0933  ? GFRNONAA 94 02/11/2018 0928  ? GFRAA 109 02/11/2018

## 2021-06-27 LAB — COMPLETE METABOLIC PANEL WITH GFR
AG Ratio: 1.5 (calc) (ref 1.0–2.5)
ALT: 34 U/L — ABNORMAL HIGH (ref 6–29)
AST: 28 U/L (ref 10–35)
Albumin: 4.3 g/dL (ref 3.6–5.1)
Alkaline phosphatase (APISO): 60 U/L (ref 37–153)
BUN: 22 mg/dL (ref 7–25)
CO2: 27 mmol/L (ref 20–32)
Calcium: 9.9 mg/dL (ref 8.6–10.4)
Chloride: 102 mmol/L (ref 98–110)
Creat: 0.7 mg/dL (ref 0.60–1.00)
Globulin: 2.8 g/dL (calc) (ref 1.9–3.7)
Glucose, Bld: 233 mg/dL — ABNORMAL HIGH (ref 65–99)
Potassium: 4.9 mmol/L (ref 3.5–5.3)
Sodium: 138 mmol/L (ref 135–146)
Total Bilirubin: 0.5 mg/dL (ref 0.2–1.2)
Total Protein: 7.1 g/dL (ref 6.1–8.1)
eGFR: 92 mL/min/{1.73_m2} (ref >=60)

## 2021-06-27 LAB — CBC
HCT: 44.2 % (ref 35.0–45.0)
Hemoglobin: 14.5 g/dL (ref 11.7–15.5)
MCH: 29.6 pg (ref 27.0–33.0)
MCHC: 32.8 g/dL (ref 32.0–36.0)
MCV: 90.2 fL (ref 80.0–100.0)
MPV: 12.1 fL (ref 7.5–12.5)
Platelets: 268 10*3/uL (ref 140–400)
RBC: 4.9 10*6/uL (ref 3.80–5.10)
RDW: 12.8 % (ref 11.0–15.0)
WBC: 7.1 10*3/uL (ref 3.8–10.8)

## 2021-06-27 LAB — LIPID PANEL
Cholesterol: 206 mg/dL — ABNORMAL HIGH (ref <200)
HDL: 47 mg/dL — ABNORMAL LOW (ref >=50)
Non-HDL Cholesterol (Calc): 159 mg/dL (calc) — ABNORMAL HIGH (ref <130)
Total CHOL/HDL Ratio: 4.4 (calc) (ref <5.0)
Triglycerides: 422 mg/dL — ABNORMAL HIGH (ref <150)

## 2021-06-27 LAB — HEMOGLOBIN A1C
Hgb A1c MFr Bld: 8 % of total Hgb — ABNORMAL HIGH (ref <5.7)
Mean Plasma Glucose: 183 mg/dL
eAG (mmol/L): 10.1 mmol/L

## 2021-06-27 LAB — MICROALBUMIN / CREATININE URINE RATIO
Creatinine, Urine: 84 mg/dL (ref 20–275)
Microalb Creat Ratio: 5 mcg/mg creat (ref <30)
Microalb, Ur: 0.4 mg/dL

## 2021-06-30 ENCOUNTER — Other Ambulatory Visit: Payer: Self-pay

## 2021-06-30 NOTE — Telephone Encounter (Signed)
Maria W [name], NP  ?06/27/2021 10:10 AM EDT Back to Top  ?  ?ALT (liver enzyme) is slightly elevated but in the setting of a normal AST and alk phos, no intervention is needed at this time.  This could be due to fat buildup around the liver.  You should consume a low saturated fat diet and try to exercise for some weight loss.  Kidney function is normal.  Cholesterol remains elevated.  [name] is not proving to be effective.  I really think she needs to try statin therapy again to reduce her risk of heart attack or stroke.  Please let me know if she is agreeable and I will send this in.  A1c is up to 8%, not good.  Would she be willing to increase her metformin to 1000 mg 2 times daily?  If so, send in #180, 0 refills.  Blood counts are normal.  Diabetes is not affecting her kidneys.  I need to see her back in 3 months for follow-up.  ? ?

## 2021-06-30 NOTE — Telephone Encounter (Signed)
Pt advised.  She agreed to try a statin therapy again.  She also agreed to increase Metformin.  Follow up scheduled for 10/01/2021 at 8am. ? ?Thanks,  ? ?-Mickel Baas  ?

## 2021-07-01 ENCOUNTER — Other Ambulatory Visit: Payer: Self-pay | Admitting: Internal Medicine

## 2021-07-01 MED ORDER — ATORVASTATIN CALCIUM 10 MG PO TABS: 10.0000 mg | ORAL_TABLET | Freq: Every day | ORAL | 2 refills | Status: DC

## 2021-07-01 MED ORDER — METFORMIN HCL 1000 MG PO TABS: 1000.0000 mg | ORAL_TABLET | Freq: Two times a day (BID) | ORAL | 0 refills | Status: DC

## 2021-07-17 DIAGNOSIS — E119 Type 2 diabetes mellitus without complications: Secondary | ICD-10-CM | POA: Diagnosis not present

## 2021-07-17 DIAGNOSIS — H2513 Age-related nuclear cataract, bilateral: Secondary | ICD-10-CM | POA: Diagnosis not present

## 2021-07-17 LAB — HM DIABETES EYE EXAM

## 2021-07-25 ENCOUNTER — Other Ambulatory Visit: Payer: Self-pay | Admitting: Internal Medicine

## 2021-07-25 DIAGNOSIS — E782 Mixed hyperlipidemia: Secondary | ICD-10-CM

## 2021-07-25 DIAGNOSIS — E1165 Type 2 diabetes mellitus with hyperglycemia: Secondary | ICD-10-CM

## 2021-07-25 DIAGNOSIS — R809 Proteinuria, unspecified: Secondary | ICD-10-CM

## 2021-07-25 DIAGNOSIS — E1129 Type 2 diabetes mellitus with other diabetic kidney complication: Secondary | ICD-10-CM

## 2021-07-29 NOTE — Telephone Encounter (Signed)
Requested medications are due for refill today.  unsure  Requested medications are on the active medications list.  yes  Last refill. 04/24/2021 #90 0 refills  Future visit scheduled.   yes  Notes to clinic.  Per lab note from provider - pt should start statin  - unsure if pt is to keep taking Zetia.    Requested Prescriptions  Pending Prescriptions Disp Refills   ezetimibe (ZETIA) 10 MG tablet [Pharmacy Med Name: EZETIMIBE 10 MG TABLET] 90 tablet 0    Sig: TAKE 1 TABLET BY MOUTH EVERY DAY     Cardiovascular:  Antilipid - Sterol Transport Inhibitors Failed - 07/25/2021  1:59 AM      Failed - ALT in normal range and within 360 days    ALT  Date Value Ref Range Status  06/26/2021 34 (H) 6 - 29 U/L Final         Failed - Lipid Panel in normal range within the last 12 months    Cholesterol  Date Value Ref Range Status  06/26/2021 206 (H) <200 mg/dL Final   LDL Cholesterol (Calc)  Date Value Ref Range Status  06/26/2021  mg/dL (calc) Final    Comment:    . LDL cholesterol not calculated. Triglyceride levels greater than 400 mg/dL invalidate calculated LDL results. . Reference range: <100 . Desirable range <100 mg/dL for primary prevention;   <70 mg/dL for patients with CHD or diabetic patients  with > or = 2 CHD risk factors. Marland Kitchen LDL-C is now calculated using the Martin-Hopkins  calculation, which is a validated novel method providing  better accuracy than the Friedewald equation in the  estimation of LDL-C.  Cresenciano Genre et al. Annamaria Helling. 0263;785(88): 2061-2068  (http://education.[facility].com/faq/FAQ164)    Direct LDL  Date Value Ref Range Status  12/13/2015 115.0 mg/dL Final    Comment:    Optimal:  <100 mg/dLNear or Above Optimal:  100-129 mg/dLBorderline High:  130-159 mg/dLHigh:  160-189 mg/dLVery High:  >190 mg/dL   HDL  Date Value Ref Range Status  06/26/2021 47 (L) > OR = 50 mg/dL Final   Triglycerides  Date Value Ref Range Status  06/26/2021 422 (H)  <150 mg/dL Final    Comment:    . If a non-fasting specimen was collected, consider repeat triglyceride testing on a fasting specimen if clinically indicated.  Yates Decamp et al. J. of Clin. Lipidol. 5027;7:412-878. Marland Kitchen          Passed - AST in normal range and within 360 days    AST  Date Value Ref Range Status  06/26/2021 28 10 - 35 U/L Final         Passed - Patient is not pregnant      Passed - Valid encounter within last 12 months    Recent Outpatient Visits           1 month ago Encounter for general adult medical examination with abnormal findings   [facility] [place], Coralie Keens, NP   4 months ago Hyponatremia   [facility] [place], Coralie Keens, NP   1 year ago Urinary urgency   [facility] [place], [place], NP   1 year ago Type 2 diabetes mellitus with hyperglycemia, without long-term current use of insulin [facility])   [facility] Kathrine Haddock, NP   1 year ago [place], Lupita Raider, [place]       Future Appointments  In 2 months [name], Coralie Keens, NP [facility], [facility]             Signed Prescriptions Disp Refills   lisinopril (ZESTRIL) 5 MG tablet 90 tablet 1    Sig: TAKE 1 TABLET (5 MG TOTAL) BY MOUTH DAILY.     Cardiovascular:  ACE Inhibitors Passed - 07/25/2021  1:59 AM      Passed - Cr in normal range and within 180 days    Creat  Date Value Ref Range Status  06/26/2021 0.70 0.60 - 1.00 mg/dL Final   Creatinine,U  Date Value Ref Range Status  12/13/2015 153.6 mg/dL Final   Creatinine, Urine  Date Value Ref Range Status  06/26/2021 84 20 - 275 mg/dL Final         Passed - K in normal range and within 180 days    Potassium  Date Value Ref Range Status  06/26/2021 4.9 3.5 - 5.3 mmol/L Final         Passed - Patient is not pregnant      Passed - Last BP in normal range    BP Readings from Last 1 Encounters:  06/26/21 114/62          Passed - Valid encounter within last 6 months    Recent Outpatient Visits           1 month ago Encounter for general adult medical examination with abnormal findings   [facility] [place], Coralie Keens, NP   4 months ago Hyponatremia   [facility] [place], Coralie Keens, NP   1 year ago Urinary urgency   [facility] [place], [place], NP   1 year ago Type 2 diabetes mellitus with hyperglycemia, without long-term current use of insulin [facility])   [facility] Kathrine Haddock, NP   1 year ago [facility] [place], Lupita Raider, FNP       Future Appointments             In 2 months [name], Coralie Keens, NP [facility], [facility]

## 2021-07-29 NOTE — Telephone Encounter (Signed)
Requested Prescriptions  Pending Prescriptions Disp Refills  . ezetimibe (ZETIA) 10 MG tablet [Pharmacy Med Name: EZETIMIBE 10 MG TABLET] 90 tablet 0    Sig: TAKE 1 TABLET BY MOUTH EVERY DAY     Cardiovascular:  Antilipid - Sterol Transport Inhibitors Failed - 07/25/2021  1:59 AM      Failed - ALT in normal range and within 360 days    ALT  Date Value Ref Range Status  06/26/2021 34 (H) 6 - 29 U/L Final         Failed - Lipid Panel in normal range within the last 12 months    Cholesterol  Date Value Ref Range Status  06/26/2021 206 (H) <200 mg/dL Final   LDL Cholesterol (Calc)  Date Value Ref Range Status  06/26/2021  mg/dL (calc) Final    Comment:    . LDL cholesterol not calculated. Triglyceride levels greater than 400 mg/dL invalidate calculated LDL results. . Reference range: <100 . Desirable range <100 mg/dL for primary prevention;   <70 mg/dL for patients with CHD or diabetic patients  with > or = 2 CHD risk factors. Marland Kitchen LDL-C is now calculated using the Martin-Hopkins  calculation, which is a validated novel method providing  better accuracy than the Friedewald equation in the  estimation of LDL-C.  Cresenciano Genre et al. Annamaria Helling. 7341;937(90): 2061-2068  (http://education.[facility].com/faq/FAQ164)    Direct LDL  Date Value Ref Range Status  12/13/2015 115.0 mg/dL Final    Comment:    Optimal:  <100 mg/dLNear or Above Optimal:  100-129 mg/dLBorderline High:  130-159 mg/dLHigh:  160-189 mg/dLVery High:  >190 mg/dL   HDL  Date Value Ref Range Status  06/26/2021 47 (L) > OR = 50 mg/dL Final   Triglycerides  Date Value Ref Range Status  06/26/2021 422 (H) <150 mg/dL Final    Comment:    . If a non-fasting specimen was collected, consider repeat triglyceride testing on a fasting specimen if clinically indicated.  Yates Decamp et al. J. of Clin. Lipidol. 2409;7:353-299. Marland Kitchen          Passed - AST in normal range and within 360 days    AST  Date Value Ref Range  Status  06/26/2021 28 10 - 35 U/L Final         Passed - Patient is not pregnant      Passed - Valid encounter within last 12 months    Recent Outpatient Visits          1 month ago Encounter for general adult medical examination with abnormal findings   [facility] [place], Coralie Keens, NP   4 months ago Hyponatremia   [facility] [place], Coralie Keens, NP   1 year ago Urinary urgency   [facility] [place], [place], NP   1 year ago Type 2 diabetes mellitus with hyperglycemia, without long-term current use of insulin [facility])   [facility] Kathrine Haddock, NP   1 year ago [place], FNP      Future Appointments            In 2 months [name], Coralie Keens, NP [facility], [place]           . lisinopril (ZESTRIL) 5 MG tablet [Pharmacy Med Name: LISINOPRIL 5 MG TABLET] 90 tablet 1    Sig: TAKE 1 TABLET (5 MG TOTAL) BY MOUTH DAILY.     Cardiovascular:  ACE Inhibitors Passed - 07/25/2021  1:59 AM      Passed - Cr in normal range and within 180 days    Creat  Date Value Ref Range Status  06/26/2021 0.70 0.60 - 1.00 mg/dL Final   Creatinine,U  Date Value Ref Range Status  12/13/2015 153.6 mg/dL Final   Creatinine, Urine  Date Value Ref Range Status  06/26/2021 84 20 - 275 mg/dL Final         Passed - K in normal range and within 180 days    Potassium  Date Value Ref Range Status  06/26/2021 4.9 3.5 - 5.3 mmol/L Final         Passed - Patient is not pregnant      Passed - Last BP in normal range    BP Readings from Last 1 Encounters:  06/26/21 114/62         Passed - Valid encounter within last 6 months    Recent Outpatient Visits          1 month ago Encounter for general adult medical examination with abnormal findings   [facility] [place], Coralie Keens, NP   4 months ago Hyponatremia   [facility] [place], Coralie Keens, NP   1 year ago  Urinary urgency   [facility] [place], [place], NP   1 year ago Type 2 diabetes mellitus with hyperglycemia, without long-term current use of insulin [facility])   [facility] Kathrine Haddock, NP   1 year ago [facility] [place], Lup[place], FNP      Future Appointments            In 2 months [name], Coralie Keens, NP [facility], [facility]

## 2021-08-06 ENCOUNTER — Other Ambulatory Visit: Payer: Self-pay | Admitting: Internal Medicine

## 2021-08-06 DIAGNOSIS — E1165 Type 2 diabetes mellitus with hyperglycemia: Secondary | ICD-10-CM

## 2021-08-06 NOTE — Telephone Encounter (Signed)
Requested Prescriptions  Pending Prescriptions Disp Refills  . glimepiride (AMARYL) 2 MG tablet [Pharmacy Med Name: GLIMEPIRIDE 2 MG TABLET] 90 tablet 0    Sig: TAKE 1 TABLET BY MOUTH EVERY DAY WITH BREAKFAST     Endocrinology:  Diabetes - Sulfonylureas Failed - 08/06/2021  3:32 AM      Failed - HBA1C is between 0 and 7.9 and within 180 days    Hgb A1c MFr Bld  Date Value Ref Range Status  06/26/2021 8.0 (H) <5.7 % of total Hgb Final    Comment:    For someone without known diabetes, a hemoglobin A1c value of 6.5% or greater indicates that they may have  diabetes and this should be confirmed with a follow-up  test. . For someone with known diabetes, a value <7% indicates  that their diabetes is well controlled and a value  greater than or equal to 7% indicates suboptimal  control. A1c targets should be individualized based on  duration of diabetes, age, comorbid conditions, and  other considerations. . Currently, no consensus exists regarding use of hemoglobin A1c for diagnosis of diabetes for children. .          Passed - Cr in normal range and within 360 days    Creat  Date Value Ref Range Status  06/26/2021 0.70 0.60 - 1.00 mg/dL Final   Creatinine,U  Date Value Ref Range Status  12/13/2015 153.6 mg/dL Final   Creatinine, Urine  Date Value Ref Range Status  06/26/2021 84 20 - 275 mg/dL Final         Passed - Valid encounter within last 6 months    Recent Outpatient Visits          1 month ago Encounter for general adult medical examination with abnormal findings   [facility] [place], Coralie Keens, NP   4 months ago Hyponatremia   [facility] [place], Coralie Keens, NP   1 year ago Urinary urgency   [facility] [place], [place], NP   1 year ago Type 2 diabetes mellitus with hyperglycemia, without long-term current use of insulin [facility])   [facility] Kathrine Haddock, NP   1 year ago [place], FNP      Future Appointments            In 1 month [name], Coralie Keens, NP [facility], [facility]

## 2021-09-16 ENCOUNTER — Ambulatory Visit
Admission: RE | Admit: 2021-09-16 | Discharge: 2021-09-16 | Disposition: A | Discharge status: Home / Self Care | Payer: Medicare HMO | Source: Ambulatory Visit | Attending: Internal Medicine | Admitting: Internal Medicine

## 2021-09-16 DIAGNOSIS — Z1231 Encounter for screening mammogram for malignant neoplasm of breast: Secondary | ICD-10-CM | POA: Insufficient documentation

## 2021-09-16 DIAGNOSIS — Z780 Asymptomatic menopausal state: Secondary | ICD-10-CM | POA: Diagnosis not present

## 2021-09-16 DIAGNOSIS — M85851 Other specified disorders of bone density and structure, right thigh: Secondary | ICD-10-CM | POA: Diagnosis not present

## 2021-09-26 ENCOUNTER — Other Ambulatory Visit: Payer: Self-pay | Admitting: Internal Medicine

## 2021-09-26 NOTE — Telephone Encounter (Signed)
Requested Prescriptions  Pending Prescriptions Disp Refills  . metFORMIN (GLUCOPHAGE) 1000 MG tablet [Pharmacy Med Name: METFORMIN HCL 1,000 MG TABLET] 180 tablet 0    Sig: TAKE 1 TABLET (1,000 MG TOTAL) BY MOUTH TWICE A DAY WITH FOOD     Endocrinology:  Diabetes - Biguanides Failed - 09/26/2021  2:31 AM      Failed - HBA1C is between 0 and 7.9 and within 180 days    Hgb A1c MFr Bld  Date Value Ref Range Status  06/26/2021 8.0 (H) <5.7 % of total Hgb Final    Comment:    For someone without known diabetes, a hemoglobin A1c value of 6.5% or greater indicates that they may have  diabetes and this should be confirmed with a follow-up  test. . For someone with known diabetes, a value <7% indicates  that their diabetes is well controlled and a value  greater than or equal to 7% indicates suboptimal  control. A1c targets should be individualized based on  duration of diabetes, age, comorbid conditions, and  other considerations. . Currently, no consensus exists regarding use of hemoglobin A1c for diagnosis of diabetes for children. .          Failed - B12 Level in normal range and within 720 days    No results found for: "VITAMINB12"       Passed - Cr in normal range and within 360 days    Creat  Date Value Ref Range Status  06/26/2021 0.70 0.60 - 1.00 mg/dL Final   Creatinine,U  Date Value Ref Range Status  12/13/2015 153.6 mg/dL Final   Creatinine, Urine  Date Value Ref Range Status  06/26/2021 84 20 - 275 mg/dL Final         Passed - eGFR in normal range and within 360 days    GFR, Est African American  Date Value Ref Range Status  02/11/2018 109 > OR = 60 mL/min/1.64m Final   GFR, Est Non African American  Date Value Ref Range Status  02/11/2018 94 > OR = 60 mL/min/1.735mFinal   GFR, Estimated  Date Value Ref Range Status  03/06/2021 >60 >60 mL/min Final    Comment:    (NOTE) Calculated using the CKD-EPI Creatinine Equation (2021)    GFR  Date Value Ref  Range Status  07/12/2015 87.75 >60.00 mL/min Final   eGFR  Date Value Ref Range Status  06/26/2021 92 > OR = 60 mL/min/1.7311minal    Comment:    The eGFR is based on the CKD-EPI 2021 equation. To calculate  the new eGFR from a previous Creatinine or Cystatin C result, go to https://www.kidney.org/professionals/ kdoqi/gfr%5Fcalculator          Passed - Valid encounter within last 6 months    Recent Outpatient Visits          3 months ago Encounter for general adult medical examination with abnormal findings   [facility]   6 months ago Hyponatremia   [facility]   1 year ago Urinary urgency   [facility]   1 year ago Type 2 diabetes mellitus with hyperglycemia, without long-term current use of insulin ([facility]   1 year ago [place]      Future Appointments  In 5 days [name], Coralie Keens, NP [facility] [place] within normal limits and completed in the last 12 months    WBC  Date Value Ref Range Status  06/26/2021 7.1 3.8 - 10.8 Thousand/uL Final   RBC  Date Value Ref Range Status  06/26/2021 4.90 3.80 - 5.10 Million/uL Final   Hemoglobin  Date Value Ref Range Status  06/26/2021 14.5 11.7 - 15.5 g/dL Final   HCT  Date Value Ref Range Status  06/26/2021 44.2 35.0 - 45.0 % Final   MCHC  Date Value Ref Range Status  06/26/2021 32.8 32.0 - 36.0 g/dL Final   [facility]  Date Value Ref Range Status  06/26/2021 29.6 27.0 - 33.0 pg Final   MCV  Date Value Ref Range Status  06/26/2021 90.2 80.0 - 100.0 fL Final   No results found for: "PLTCOUNTKUC", "LABPLAT", "POCPLA" RDW  Date Value Ref Range Status  06/26/2021 12.8 11.0 - 15.0 % Final         . atorvastatin (LIPITOR) 10 MG tablet [Pharmacy Med Name: ATORVASTATIN 10  MG TABLET] 90 tablet 0    Sig: TAKE 1 TABLET BY MOUTH EVERY DAY     Cardiovascular:  Antilipid - Statins Failed - 09/26/2021  2:31 AM      Failed - Lipid Panel in normal range within the last 12 months    Cholesterol  Date Value Ref Range Status  06/26/2021 206 (H) <200 mg/dL Final   LDL Cholesterol (Calc)  Date Value Ref Range Status  06/26/2021  mg/dL (calc) Final    Comment:    . LDL cholesterol not calculated. Triglyceride levels greater than 400 mg/dL invalidate calculated LDL results. . Reference range: <100 . Desirable range <100 mg/dL for primary prevention;   <70 mg/dL for patients with CHD or diabetic patients  with > or = 2 CHD risk factors. Marland Kitchen LDL-C is now calculated using the Martin-Hopkins  calculation, which is a validated novel method providing  better accuracy than the Friedewald equation in the  estimation of LDL-C.  Cresenciano Genre et al. Annamaria Helling. 9381;829(93): 2061-2068  (http://education.Quest[employer].com/faq/FAQ164)    Direct LDL  Date Value Ref Range Status  12/13/2015 115.0 mg/dL Final    Comment:    Optimal:  <100 mg/dLNear or Above Optimal:  100-129 mg/dLBorderline High:  130-159 mg/dLHigh:  160-189 mg/dLVery High:  >190 mg/dL   HDL  Date Value Ref Range Status  06/26/2021 47 (L) > OR = 50 mg/dL Final   Triglycerides  Date Value Ref Range Status  06/26/2021 422 (H) <150 mg/dL Final    Comment:    . If a non-fasting specimen was collected, consider repeat triglyceride testing on a fasting specimen if clinically indicated.  Yates Decamp et al. J. of Clin. Lipidol. 7169;6:789-381. Marland Kitchen          Passed - Patient is not pregnant      Passed - Valid encounter within last 12 months    Recent Outpatient Visits          3 months ago Encounter for general adult medical examination with abnormal findings   [facility], Coralie Keens, NP   6 months ago Hyponatremia   [facility] [place], Coralie Keens, NP   1 year ago Urinary  urgency   [facility] [place], [place], NP   1 year ago Type 2 diabetes mellitus with hyperglycemia, without  long-term current use of insulin [facility])   [facility] Kathrine Haddock, NP   1 year ago [place], FNP      Future Appointments            In 5 days [name], Coralie Keens, NP [facility]

## 2021-10-01 ENCOUNTER — Ambulatory Visit
Admission: RE | Admit: 2021-10-01 | Discharge: 2021-10-01 | Disposition: A | Discharge status: Home / Self Care | Payer: Medicare HMO | Attending: Internal Medicine | Admitting: Internal Medicine

## 2021-10-01 ENCOUNTER — Encounter: Payer: Self-pay | Admitting: Internal Medicine

## 2021-10-01 ENCOUNTER — Ambulatory Visit
Admission: RE | Admit: 2021-10-01 | Discharge: 2021-10-01 | Disposition: A | Discharge status: Home / Self Care | Payer: Medicare HMO | Source: Ambulatory Visit | Attending: Internal Medicine | Admitting: Internal Medicine

## 2021-10-01 ENCOUNTER — Ambulatory Visit (INDEPENDENT_AMBULATORY_CARE_PROVIDER_SITE_OTHER): Payer: Medicare HMO | Admitting: Internal Medicine

## 2021-10-01 VITALS — BP 126/71 | HR 67 | Temp 97.1°F | Wt 183.0 lb

## 2021-10-01 DIAGNOSIS — M25561 Pain in right knee: Secondary | ICD-10-CM | POA: Diagnosis not present

## 2021-10-01 DIAGNOSIS — G8929 Other chronic pain: Secondary | ICD-10-CM

## 2021-10-01 DIAGNOSIS — R809 Proteinuria, unspecified: Secondary | ICD-10-CM

## 2021-10-01 DIAGNOSIS — R42 Dizziness and giddiness: Secondary | ICD-10-CM | POA: Diagnosis not present

## 2021-10-01 DIAGNOSIS — F419 Anxiety disorder, unspecified: Secondary | ICD-10-CM | POA: Diagnosis not present

## 2021-10-01 DIAGNOSIS — E6609 Other obesity due to excess calories: Secondary | ICD-10-CM

## 2021-10-01 DIAGNOSIS — F32A Depression, unspecified: Secondary | ICD-10-CM

## 2021-10-01 DIAGNOSIS — M8588 Other specified disorders of bone density and structure, other site: Secondary | ICD-10-CM | POA: Diagnosis not present

## 2021-10-01 DIAGNOSIS — Z6830 Body mass index (BMI) 30.0-30.9, adult: Secondary | ICD-10-CM

## 2021-10-01 DIAGNOSIS — E782 Mixed hyperlipidemia: Secondary | ICD-10-CM

## 2021-10-01 DIAGNOSIS — E1165 Type 2 diabetes mellitus with hyperglycemia: Secondary | ICD-10-CM

## 2021-10-01 DIAGNOSIS — E1129 Type 2 diabetes mellitus with other diabetic kidney complication: Secondary | ICD-10-CM | POA: Diagnosis not present

## 2021-10-01 LAB — POCT GLYCOSYLATED HEMOGLOBIN (HGB A1C): Hemoglobin A1C: 7.1 % — AB (ref 4.0–5.6)

## 2021-10-01 NOTE — Assessment & Plan Note (Signed)
POCT A1c 7.1% Urine microalbumin checked 06/2018 Continue metformin, glimepiride and lisinopril Encouraged her to check fasting sugars at least 3 times weekly Encourage low-carb diet and exercise for weight loss Encourage routine eye exam Encourage routine foot exam Immunizations UTD

## 2021-10-01 NOTE — Assessment & Plan Note (Signed)
C-Met and lipid profile today Encouraged her to consume a low-fat diet Continue atorvastatin and ezetimibe

## 2021-10-01 NOTE — Assessment & Plan Note (Signed)
Continue lisinopril for renal protection 

## 2021-10-01 NOTE — Assessment & Plan Note (Signed)
Encourage diet and exercise for weight loss 

## 2021-10-01 NOTE — Progress Notes (Signed)
Subjective:    Patient ID: Maria Sloan, female    DOB: [birth year], 71 y.o.   MRN: [id number]  HPI  Patient presents to clinic today for 60-monthfollow-up of chronic conditions.  HLD: Her last LDL was not calculated, triglycerides 422, 05/2021.  She denies myalgias on Atorvastatin and Ezetimibe.  She has been trying to consume a low-fat diet.  DM2: Her last A1c was 8%, 05/2021.  Her Metformin was increased to 1000 mg twice daily.  She is taking Glimepiride and Lisinopril as prescribed.  She is not checking her sugars regularly. She checks her feet routinely.  Her last eye exam was 06/2021.  Flu 12/2020.  Pneumovax 08/2019.  Prevnar 12/2017.  CShirleyx3.  Anxiety and Depression: Chronic, managed on Citalopram and Buspirone.  She is not currently seeing a therapist.  She denies SI/HI.  Osteopenia: She is taking Calcium and Vitamin D OTC.  She is getting weightbearing exercise daily.  Bone density from 08/2021 reviewed.  OA: Mainly in her right knee.  She reports associated swelling at times.  She is taking Meloxicam and Methocarbamol with minimal relief of symptoms.  Vertigo: Intermittent.  She typically does the Epley maneuver and takes Meclizine as needed with good relief of symptoms.  Review of Systems     Past Medical History:  Diagnosis Date   Anxiety    Arthritis    Cervical cancer ([place]    Hyperlipidemia    PVD (peripheral vascular disease) (HCC)    R leg venous insufficiency   PVD (peripheral vascular disease) (HCC)    Shingles    Type 2 diabetes mellitus (HCC)     Current Outpatient Medications  Medication Sig Dispense Refill   Accu-Chek Softclix Lancets lancets ONE TIME DAILY AS DIRECTED. FOR ICD-10 E10.9, E11.9 A1C 02/11/2018- 11.8% 100 each 12   atorvastatin (LIPITOR) 10 MG tablet TAKE 1 TABLET BY MOUTH EVERY DAY 90 tablet 0   Blood Glucose Monitoring Suppl (ACCU-CHEK AVIVA PLUS) w/Device KIT 1 Device by Does not apply route as directed. . One time daily as  directed. FOR ICD-10 E10.9, E11.9 A1C 02/11/2018- 11.8% 1 kit 0   busPIRone (BUSPAR) 5 MG tablet TAKE 1 TABLET BY MOUTH EVERYDAY AT BEDTIME 90 tablet 1   CALCIUM CITRATE PO Take 1 tablet by mouth daily.      Cholecalciferol (VITAMIN D PO) Take 1 tablet by mouth daily.      citalopram (CELEXA) 20 MG tablet TAKE 1 TABLET BY MOUTH EVERY DAY 90 tablet 2   ezetimibe (ZETIA) 10 MG tablet TAKE 1 TABLET BY MOUTH EVERY DAY 90 tablet 1   fluticasone (FLONASE) 50 MCG/ACT nasal spray PLACE 2 SPRAYS INTO BOTH NOSTRILS DAILY. USE FOR 4-6 WEEKS THEN STOP AND USE AS NEEDED 48 mL 0   glimepiride (AMARYL) 2 MG tablet TAKE 1 TABLET BY MOUTH EVERY DAY WITH BREAKFAST 90 tablet 0   glucose blood (ACCU-CHEK AVIVA PLUS) test strip USE UP TO ONE TIME DAILY AS DIRECTED. FOR ICD-10 E10.9, E11.9 A1C 02/11/2018- 11.8% 100 strip 12   lisinopril (ZESTRIL) 5 MG tablet TAKE 1 TABLET (5 MG TOTAL) BY MOUTH DAILY. 90 tablet 1   meloxicam (MOBIC) 15 MG tablet Take 15 mg by mouth daily.      metFORMIN (GLUCOPHAGE) 1000 MG tablet TAKE 1 TABLET (1,000 MG TOTAL) BY MOUTH TWICE A DAY WITH FOOD 180 tablet 0   methocarbamol (ROBAXIN) 500 MG tablet Take 500 mg by mouth at bedtime.      Multiple  Vitamin (MULTIVITAMIN) tablet Take 1 tablet by mouth daily.     Omega-3 Fatty Acids (FISH OIL) 1000 MG CAPS Take 1,000 mg by mouth 3 (three) times daily.     Potassium 75 MG TABS Take 1 tablet by mouth daily.     No current facility-administered medications for this visit.    Allergies  Allergen Reactions   Prochlorperazine Other (See Comments)    Compazine   Other     Other reaction(s): Dizziness   Actifed Cold-Allergy [Chlorpheniramine-Phenylephrine]     Ok with Robitussin, Mucinex   Penicillins     Family History  Problem Relation Age of Onset   Stroke Mother    Emphysema Father    Lung cancer Father    Hyperlipidemia Father    Hyperlipidemia Sister    Healthy Son    Colon cancer Neg Hx    Breast cancer Neg Hx    Ovarian  cancer Neg Hx     Social History   Socioeconomic History   Marital status: Divorced    Spouse name: Not on file   Number of children: Not on file   Years of education: Not on file   Highest education level: Bachelor's degree (e.g., BA, AB, BS)  Occupational History   Occupation: retired   Tobacco Use   Smoking status: Former    Packs/day: 1.00    Years: 25.00    Total pack years: 25.00    Types: Cigarettes    Quit date: 11/29/1994    Years since quitting: 26.8   Smokeless tobacco: Never  Vaping Use   Vaping Use: Never used  Substance and Sexual Activity   Alcohol use: Yes    Alcohol/week: 0.0 standard drinks of alcohol    Comment: occasionally   Drug use: Never   Sexual activity: Not Currently    Birth control/protection: None  Other Topics Concern   Not on file  Social History Narrative   Divorced.   1 son.    Moved from [place].   Enjoys making jewelry, spending time with family, crafts, reading.   Social Determinants of Health   Financial Resource Strain: Low Risk  (06/05/2021)   Overall Financial Resource Strain (CARDIA)    Difficulty of Paying Living Expenses: Not hard at all  Food Insecurity: No Food Insecurity (06/05/2021)   Hunger Vital Sign    Worried About Running Out of Food in the Last Year: Never true    Ran Out of Food in the Last Year: Never true  Transportation Needs: No Transportation Needs (06/05/2021)   PRAPARE - Hydrologist (Medical): No    Lack of Transportation (Non-Medical): No  Physical Activity: Insufficiently Active (06/05/2021)   Exercise Vital Sign    Days of Exercise per Week: 3 days    Minutes of Exercise per Session: 30 min  Stress: No Stress Concern Present (06/05/2021)   Ricardo    Feeling of Stress : Only a little  Social Connections: Socially Isolated (06/05/2021)   Social Connection and Isolation Panel [NHANES]    Frequency of  Communication with Friends and Family: More than three times a week    Frequency of Social Gatherings with Friends and Family: More than three times a week    Attends Religious Services: Never    Marine scientist or Organizations: No    Attends Archivist Meetings: Never    Marital Status: Divorced  Human resources officer Violence:  Not At Risk (06/05/2021)   Humiliation, Afraid, Rape, and Kick questionnaire    Fear of Current or Ex-Partner: No    Emotionally Abused: No    Physically Abused: No    Sexually Abused: No     Constitutional: Denies fever, malaise, fatigue, headache or abrupt weight changes.  HEENT: Denies eye pain, eye redness, ear pain, ringing in the ears, wax buildup, runny nose, nasal congestion, bloody nose, or sore throat. Respiratory: Denies difficulty breathing, shortness of breath, cough or sputum production.   Cardiovascular: Denies chest pain, chest tightness, palpitations or swelling in the hands or feet.  Gastrointestinal: Denies abdominal pain, bloating, constipation, diarrhea or blood in the stool.  GU: Denies urgency, frequency, pain with urination, burning sensation, blood in urine, odor or discharge. Musculoskeletal: Patient reports right knee pain and swelling.  Denies decrease in range of motion, difficulty with gait, muscle pain.  Skin: Denies redness, rashes, lesions or ulcercations.  Neurological: Patient reports intermittent dizziness.  Denies difficulty with memory, difficulty with speech or problems with balance and coordination.  Psych: Patient has a history of anxiety and depression.  Denies SI/HI.  No other specific complaints in a complete review of systems (except as listed in HPI above).  Objective:   Physical Exam BP 126/71 (BP Location: Left Arm, Patient Position: Sitting, Cuff Size: Normal)   Pulse 67   Temp (!) 97.1 F (36.2 C) (Temporal)   Wt 183 lb (83 kg)   SpO2 97%   BMI 30.45 kg/m   Wt Readings from Last 3 Encounters:   06/26/21 183 lb (83 kg)  06/05/21 184 lb (83.5 kg)  03/11/21 181 lb (82.1 kg)    General: Appears her stated age, obese, in NAD. Skin: Warm, dry and intact. No ulcerations noted. HEENT: Head: normal shape and size; Eyes: sclera white, no icterus, conjunctiva pink, PERRLA and EOMs intact;  Cardiovascular: Normal rate and rhythm. S1,S2 noted.  No murmur, rubs or gallops noted. No JVD or BLE edema. No carotid bruits noted. Pulmonary/Chest: Normal effort and positive vesicular breath sounds. No respiratory distress. No wheezes, rales or ronchi noted.  Musculoskeletal: Joint enlargement noted of the right knee.  Pain with palpation of the quadriceps tendon and medial joint line.  No difficulty with gait.  Neurological: Alert and oriented. Coordination normal.  Psychiatric: Mood and affect normal. Behavior is normal. Judgment and thought content normal.    BMET    Component Value Date/Time   NA 138 06/26/2021 0842   K 4.9 06/26/2021 0842   CL 102 06/26/2021 0842   CO2 27 06/26/2021 0842   GLUCOSE 233 (H) 06/26/2021 0842   BUN 22 06/26/2021 0842   CREATININE 0.70 06/26/2021 0842   CALCIUM 9.9 06/26/2021 0842   GFRNONAA >60 03/06/2021 0933   GFRNONAA 94 02/11/2018 0928   GFRAA 109 02/11/2018 0928    Lipid Panel     Component Value Date/Time   CHOL 206 (H) 06/26/2021 0842   TRIG 422 (H) 06/26/2021 0842   HDL 47 (L) 06/26/2021 0842   CHOLHDL 4.4 06/26/2021 0842   LDLCALC  06/26/2021 0842     Comment:     . LDL cholesterol not calculated. Triglyceride levels greater than 400 mg/dL invalidate calculated LDL results. . Reference range: <100 . Desirable range <100 mg/dL for primary prevention;   <70 mg/dL for patients with CHD or diabetic patients  with > or = 2 CHD risk factors. Marland Kitchen LDL-C is now calculated using the Martin-Hopkins  calculation, which is a  validated novel method providing  better accuracy than the Friedewald equation in the  estimation of LDL-C.  Cresenciano Genre et  al. Annamaria Helling. 1595;396(72): 2061-2068  (http://education.[facility].com/faq/FAQ164)     CBC    Component Value Date/Time   WBC 7.1 06/26/2021 0842   RBC 4.90 06/26/2021 0842   HGB 14.5 06/26/2021 0842   HCT 44.2 06/26/2021 0842   PLT 268 06/26/2021 0842   MCV 90.2 06/26/2021 0842   MCH 29.6 06/26/2021 0842   MCHC 32.8 06/26/2021 0842   RDW 12.8 06/26/2021 0842   LYMPHSABS 1.3 03/06/2021 0933   MONOABS 1.0 03/06/2021 0933   EOSABS 0.0 03/06/2021 0933   BASOSABS 0.0 03/06/2021 0933    Hgb A1C Lab Results  Component Value Date   HGBA1C 8.0 (H) 06/26/2021            Assessment & Plan:   RTC in 6 months, follow-up chronic conditions Webb Silversmith, NP

## 2021-10-01 NOTE — Assessment & Plan Note (Addendum)
Stable on her current dose of citalopram and buspirone Support offered

## 2021-10-01 NOTE — Assessment & Plan Note (Signed)
Continue calcium and vitamin D Encourage daily weightbearing exercise 

## 2021-10-01 NOTE — Assessment & Plan Note (Signed)
Continue meclizine as needed 

## 2021-10-01 NOTE — Patient Instructions (Signed)
Exercise Information for Aging Adults Staying physically active is important as you age. Physical activity and exercise can help in maintaining quality of life, health, physical function, and reducing falls. The four types of exercises that are best for older adults are endurance, strength, balance, and flexibility. Contact your health care provider before you start any exercise routine. Ask your health care provider what activities are safe for you. What are the risks? Risks associated with exercising include: Overdoing it. This may lead to sore muscles or fatigue. Falls. Injuries. Dehydration. How to do these exercises Endurance exercises Endurance (aerobic) exercises raise your breathing rate and heart rate. Increasing your endurance helps you do everyday tasks and stay healthy. By improving the health of your body system that includes your heart, lungs, and blood vessels (circulatory system), you may also delay or prevent diseases such as heart disease, diabetes, and weak bones (osteoporosis). Types of endurance exercises include: Sports. Indoor activities, such as using gym equipment, doing water aerobics, or dancing. Outdoor activities, such as biking or jogging. Tasks around the house, such as gardening, yard work, and heavy household chores like cleaning. Walking, such as hiking or walking around your neighborhood. When doing endurance exercises, make sure you: Are aware of your surroundings. Use safety equipment as directed. Dress in layers when exercising outdoors. Drink plenty of water to stay well hydrated. Build up endurance slowly. Start with 10 minutes at a time, and gradually build up to doing 30 minutes at a time. Unless your health care provider gave you different instructions, aim to exercise for a total of 150 minutes a week. Spread out that time so you are working on endurance 3 or more days a week. Strength exercises Lifting, pulling, or pushing weights helps to  strengthen muscles. Having stronger muscles makes it easier to do everyday activities, such as getting up from a chair, climbing stairs, carrying groceries, and playing with grandchildren. Strength exercises include arm and leg exercises that may be done: With weights. Without weights (using your own body weight). With a resistance band. When doing strength exercises: Move smoothly and steadily. Do not suddenly thrust or jerk the weights, the resistance band, or your body. Start with no weights or with light weights, and gradually add more weight over time. Eventually, aim to use weights that are hard or very hard for you to lift. This means that you are able to do 8 repetitions with the weight, and the last few repetitions are very challenging. Lift or push weights into position for 3 seconds, hold the position for 1 second, and then take 3 seconds to return to your starting position. Breathe out (exhale) during difficult movements, like lifting or pushing weights. Breathe in (inhale) to relax your muscles before the next repetition. Consider alternating arms or legs, especially when you first start strength exercises. Expect some slight muscle soreness after each session. Do strength exercises on 2 or more days a week, for 30 minutes at a time. Avoid exercising the same muscle groups two days in a row. For example, if you work on your leg muscles one day, work on your arm muscles the next day. When you can do two sets of 10-15 repetitions with a certain weight, increase the amount of weight. Balance exercises Balance exercises can help to prevent falls. Balance exercises include: Standing on one foot. Heel-to-toe walk. Balance walk. Tai chi. Make sure you have something sturdy to hold onto while doing balance exercises, such as a sturdy chair. As your balance  improves, challenge yourself by holding on to the chair with one hand instead of two, and then with no hands. Trying exercises with your  eyes closed also challenges your balance, but be sure to have a sturdy surface (like a countertop) close by in case you need it. Do balance exercises as often as you want, or as often as directed by your health care provider. Flexibility exercises  Flexibility exercises improve how far you can bend, straighten, move, or rotate parts of your body (range of motion). These exercises also help you do everyday activities such as getting dressed or reaching for objects. Flexibility exercises include stretching different parts of the body, and they may be done in a standing or seated position or on the floor. When stretching, make sure you: Keep a slight bend in your arms and legs. Avoid completely straightening ("locking") your joints. Do not stretch so far that you feel pain. You should feel a mild stretching feeling. You may try stretching farther as you become more flexible over time. Relax and breathe between stretches. Hold on to something sturdy for balance as needed. Hold each stretch for 10-30 seconds. Repeat each stretch 3-5 times. General safety tips Exercise in well-lit areas. Do not hold your breath during exercises or stretches. Warm up before exercising, and cool down after exercising. This can help prevent injury. Drink plenty of water during exercise or any activity that makes you sweat. If you are not sure if an exercise is safe for you, or you are not sure how to do an exercise, talk with your health care provider. This is especially important if you have had surgery on muscles, bones, or joints (orthopedic surgery). Where to find more information You can find more information about exercise for older adults from: Your local health department, fitness center, or community center. These facilities may have programs for aging adults. National Institute on Aging: www.nia.nih.gov National Council on Aging: www.ncoa.org Summary Staying physically active is important as you age. Doing  endurance, strength, balance, and flexibility exercises can help in maintaining quality of life, health, physical function, and reducing falls. Make sure to contact your health care provider before you start any exercise routine. Ask your health care provider what activities are safe for you. This information is not intended to replace advice given to you by your health care provider. Make sure you discuss any questions you have with your health care provider. Document Revised: 07/01/2020 Document Reviewed: 07/01/2020 Elsevier Patient Education  2023 Elsevier Inc.  

## 2021-10-01 NOTE — Assessment & Plan Note (Signed)
X-ray right knee today Continue meloxicam and methocarbamol as previously prescribed We will likely need Ortho referral

## 2021-10-02 ENCOUNTER — Encounter: Payer: Self-pay | Admitting: Internal Medicine

## 2021-10-02 LAB — COMPLETE METABOLIC PANEL WITH GFR
AG Ratio: 1.7 (calc) (ref 1.0–2.5)
ALT: 24 U/L (ref 6–29)
AST: 23 U/L (ref 10–35)
Albumin: 4.2 g/dL (ref 3.6–5.1)
Alkaline phosphatase (APISO): 52 U/L (ref 37–153)
BUN: 19 mg/dL (ref 7–25)
CO2: 29 mmol/L (ref 20–32)
Calcium: 9.4 mg/dL (ref 8.6–10.4)
Chloride: 103 mmol/L (ref 98–110)
Creat: 0.71 mg/dL (ref 0.60–1.00)
Globulin: 2.5 g/dL (calc) (ref 1.9–3.7)
Glucose, Bld: 173 mg/dL — ABNORMAL HIGH (ref 65–99)
Potassium: 4.4 mmol/L (ref 3.5–5.3)
Sodium: 139 mmol/L (ref 135–146)
Total Bilirubin: 0.4 mg/dL (ref 0.2–1.2)
Total Protein: 6.7 g/dL (ref 6.1–8.1)
eGFR: 91 mL/min/{1.73_m2} (ref >=60)

## 2021-10-02 LAB — LIPID PANEL
Cholesterol: 149 mg/dL (ref <200)
HDL: 33 mg/dL — ABNORMAL LOW (ref >=50)
Non-HDL Cholesterol (Calc): 116 mg/dL (calc) (ref <130)
Total CHOL/HDL Ratio: 4.5 (calc) (ref <5.0)
Triglycerides: 423 mg/dL — ABNORMAL HIGH (ref <150)

## 2021-10-03 MED ORDER — ATORVASTATIN CALCIUM 20 MG PO TABS: 20.0000 mg | ORAL_TABLET | Freq: Every day | ORAL | 1 refills | Status: DC

## 2021-10-06 DIAGNOSIS — M1711 Unilateral primary osteoarthritis, right knee: Secondary | ICD-10-CM | POA: Diagnosis not present

## 2021-10-14 DIAGNOSIS — H2513 Age-related nuclear cataract, bilateral: Secondary | ICD-10-CM | POA: Diagnosis not present

## 2021-10-14 DIAGNOSIS — H40023 Open angle with borderline findings, high risk, bilateral: Secondary | ICD-10-CM | POA: Diagnosis not present

## 2021-10-14 DIAGNOSIS — E119 Type 2 diabetes mellitus without complications: Secondary | ICD-10-CM | POA: Diagnosis not present

## 2021-10-23 ENCOUNTER — Other Ambulatory Visit: Payer: Self-pay | Admitting: Internal Medicine

## 2021-10-23 DIAGNOSIS — E1165 Type 2 diabetes mellitus with hyperglycemia: Secondary | ICD-10-CM

## 2021-10-23 NOTE — Telephone Encounter (Signed)
Requested medication (s) are due for refill today - expired Rx  Requested medication (s) are on the active medication list -yes  Future visit scheduled -no  Last refill: 07/26/20 #100 12RF  Notes to clinic: expired Rx  Requested Prescriptions  Pending Prescriptions Disp Refills   ACCU-CHEK AVIVA PLUS test strip [Pharmacy Med Name: [place] TEST STRP] 100 strip 12    Sig: USE UP TO ONE TIME AS DIRECTED     Endocrinology: Diabetes - Testing Supplies Passed - 10/23/2021  8:12 AM      Passed - Valid encounter within last 12 months    Recent Outpatient Visits           3 weeks ago Type 2 diabetes mellitus with hyperglycemia, without long-term current use of insulin ([place])   [facility], Coralie Keens, NP   3 months ago Encounter for general adult medical examination with abnormal findings   [facility] [place], Coralie Keens, NP   7 months ago Hyponatremia   [facility] [place], Coralie Keens, NP   1 year ago Urinary urgency   [facility] [place], [place] W, NP   1 year ago Type 2 diabetes mellitus with hyperglycemia, without long-term current use of insulin ([place])   [facility] Kathrine Haddock, NP                 Requested Prescriptions  Pending Prescriptions Disp Refills   ACCU-CHEK AVIVA PLUS test strip [Pharmacy Med Name: ACCU-CHEK AVIVA PLUS TEST STRP] 100 strip 12    Sig: USE UP TO ONE TIME AS DIRECTED     Endocrinology: Diabetes - Testing Supplies Passed - 10/23/2021  8:12 AM      Passed - Valid encounter within last 12 months    Recent Outpatient Visits           3 weeks ago Type 2 diabetes mellitus with hyperglycemia, without long-term current use of insulin ([place])   [facility] [place], Coralie Keens, NP   3 months ago Encounter for general adult medical examination with abnormal findings   [facility] [place], Coralie Keens, NP   7 months ago Hyponatremia   [facility] [place], Coralie Keens, NP   1 year ago Urinary urgency   [facility] [place], [place], NP   1 year ago Type 2 diabetes mellitus with hyperglycemia, without long-term current use of insulin [facility])   [facility] Kathrine Haddock, NP

## 2021-11-02 ENCOUNTER — Other Ambulatory Visit: Payer: Self-pay | Admitting: Internal Medicine

## 2021-11-02 DIAGNOSIS — E1165 Type 2 diabetes mellitus with hyperglycemia: Secondary | ICD-10-CM

## 2021-11-04 NOTE — Telephone Encounter (Signed)
Requested Prescriptions  Pending Prescriptions Disp Refills  . glimepiride (AMARYL) 2 MG tablet [Pharmacy Med Name: GLIMEPIRIDE 2 MG TABLET] 90 tablet 1    Sig: TAKE 1 TABLET BY MOUTH EVERY DAY WITH BREAKFAST     Endocrinology:  Diabetes - Sulfonylureas Passed - 11/02/2021  9:37 AM      Passed - HBA1C is between 0 and 7.9 and within 180 days    Hemoglobin A1C  Date Value Ref Range Status  10/01/2021 7.1 (A) 4.0 - 5.6 % Final   Hgb A1c MFr Bld  Date Value Ref Range Status  06/26/2021 8.0 (H) <5.7 % of total Hgb Final    Comment:    For someone without known diabetes, a hemoglobin A1c value of 6.5% or greater indicates that they may have  diabetes and this should be confirmed with a follow-up  test. . For someone with known diabetes, a value <7% indicates  that their diabetes is well controlled and a value  greater than or equal to 7% indicates suboptimal  control. A1c targets should be individualized based on  duration of diabetes, age, comorbid conditions, and  other considerations. . Currently, no consensus exists regarding use of hemoglobin A1c for diagnosis of diabetes for children. .          Passed - Cr in normal range and within 360 days    Creat  Date Value Ref Range Status  10/01/2021 0.71 0.60 - 1.00 mg/dL Final   Creatinine,U  Date Value Ref Range Status  12/13/2015 153.6 mg/dL Final   Creatinine, Urine  Date Value Ref Range Status  06/26/2021 84 20 - 275 mg/dL Final         Passed - Valid encounter within last 6 months    Recent Outpatient Visits          1 month ago Type 2 diabetes mellitus with hyperglycemia, without long-term current use of insulin ([place])   [facility], Coralie Keens, NP   4 months ago Encounter for general adult medical examination with abnormal findings   [facility] [place], Coralie Keens, NP   7 months ago Hyponatremia   [facility] [place], Coralie Keens, NP   1 year ago Urinary urgency    [facility] [place], [place], NP   1 year ago Type 2 diabetes mellitus with hyperglycemia, without long-term current use of insulin [facility])   [facility]

## 2021-12-20 ENCOUNTER — Other Ambulatory Visit: Payer: Self-pay | Admitting: Internal Medicine

## 2021-12-23 NOTE — Telephone Encounter (Signed)
Requested Prescriptions  Pending Prescriptions Disp Refills  . metFORMIN (GLUCOPHAGE) 1000 MG tablet [Pharmacy Med Name: METFORMIN HCL 1,000 MG TABLET] 180 tablet 1    Sig: TAKE 1 TABLET (1,000 MG TOTAL) BY MOUTH TWICE A DAY WITH FOOD     Endocrinology:  Diabetes - Biguanides Failed - 12/20/2021  2:30 PM      Failed - B12 Level in normal range and within 720 days    No results found for: "VITAMINB12"       Passed - Cr in normal range and within 360 days    Creat  Date Value Ref Range Status  10/01/2021 0.71 0.60 - 1.00 mg/dL Final   Creatinine,U  Date Value Ref Range Status  12/13/2015 153.6 mg/dL Final   Creatinine, Urine  Date Value Ref Range Status  06/26/2021 84 20 - 275 mg/dL Final         Passed - HBA1C is between 0 and 7.9 and within 180 days    Hemoglobin A1C  Date Value Ref Range Status  10/01/2021 7.1 (A) 4.0 - 5.6 % Final   Hgb A1c MFr Bld  Date Value Ref Range Status  06/26/2021 8.0 (H) <5.7 % of total Hgb Final    Comment:    For someone without known diabetes, a hemoglobin A1c value of 6.5% or greater indicates that they may have  diabetes and this should be confirmed with a follow-up  test. . For someone with known diabetes, a value <7% indicates  that their diabetes is well controlled and a value  greater than or equal to 7% indicates suboptimal  control. A1c targets should be individualized based on  duration of diabetes, age, comorbid conditions, and  other considerations. . Currently, no consensus exists regarding use of hemoglobin A1c for diagnosis of diabetes for children. .          Passed - eGFR in normal range and within 360 days    GFR, Est African American  Date Value Ref Range Status  02/11/2018 109 > OR = 60 mL/min/1.106m Final   GFR, Est Non African American  Date Value Ref Range Status  02/11/2018 94 > OR = 60 mL/min/1.773mFinal   GFR, Estimated  Date Value Ref Range Status  03/06/2021 >60 >60 mL/min Final    Comment:     (NOTE) Calculated using the CKD-EPI Creatinine Equation (2021)    GFR  Date Value Ref Range Status  07/12/2015 87.75 >60.00 mL/min Final   eGFR  Date Value Ref Range Status  10/01/2021 91 > OR = 60 mL/min/1.737minal         Passed - Valid encounter within last 6 months    Recent Outpatient Visits          2 months ago Type 2 diabetes mellitus with hyperglycemia, without long-term current use of insulin ([place]   6 months ago Encounter for general adult medical examination with abnormal findings   [facility]   9 months ago Hyponatremia   [facility]   1 year ago Urinary urgency   [facility]   1 year ago Type 2 diabetes mellitus with hyperglycemia, without long-term current use of insulin ([facility] [place]             Passed - CBC within normal limits  and completed in the last 12 months    WBC  Date Value Ref Range Status  06/26/2021 7.1 3.8 - 10.8 Thousand/uL Final   RBC  Date Value Ref Range Status  06/26/2021 4.90 3.80 - 5.10 Million/uL Final   Hemoglobin  Date Value Ref Range Status  06/26/2021 14.5 11.7 - 15.5 g/dL Final   HCT  Date Value Ref Range Status  06/26/2021 44.2 35.0 - 45.0 % Final   MCHC  Date Value Ref Range Status  06/26/2021 32.8 32.0 - 36.0 g/dL Final   [facility]  Date Value Ref Range Status  06/26/2021 29.6 27.0 - 33.0 pg Final   MCV  Date Value Ref Range Status  06/26/2021 90.2 80.0 - 100.0 fL Final   No results found for: "PLTCOUNTKUC", "LABPLAT", "POCPLA" RDW  Date Value Ref Range Status  06/26/2021 12.8 11.0 - 15.0 % Final

## 2022-01-21 ENCOUNTER — Other Ambulatory Visit: Payer: Self-pay | Admitting: Internal Medicine

## 2022-01-21 DIAGNOSIS — E1165 Type 2 diabetes mellitus with hyperglycemia: Secondary | ICD-10-CM

## 2022-01-21 DIAGNOSIS — R809 Proteinuria, unspecified: Secondary | ICD-10-CM

## 2022-01-21 DIAGNOSIS — E782 Mixed hyperlipidemia: Secondary | ICD-10-CM

## 2022-01-21 NOTE — Telephone Encounter (Signed)
Requested Prescriptions  Pending Prescriptions Disp Refills   lisinopril (ZESTRIL) 5 MG tablet [Pharmacy Med Name: LISINOPRIL 5 MG TABLET] 90 tablet 0    Sig: TAKE 1 TABLET (5 MG TOTAL) BY MOUTH DAILY.     Cardiovascular:  ACE Inhibitors Passed - 01/21/2022  2:05 AM      Passed - Cr in normal range and within 180 days    Creat  Date Value Ref Range Status  10/01/2021 0.71 0.60 - 1.00 mg/dL Final   Creatinine,U  Date Value Ref Range Status  12/13/2015 153.6 mg/dL Final   Creatinine, Urine  Date Value Ref Range Status  06/26/2021 84 20 - 275 mg/dL Final         Passed - K in normal range and within 180 days    Potassium  Date Value Ref Range Status  10/01/2021 4.4 3.5 - 5.3 mmol/L Final         Passed - Patient is not pregnant      Passed - Last BP in normal range    BP Readings from Last 1 Encounters:  10/01/21 126/71         Passed - Valid encounter within last 6 months    Recent Outpatient Visits           3 months ago Type 2 diabetes mellitus with hyperglycemia, without long-term current use of insulin ([place])   [facility], Coralie Keens, NP   6 months ago Encounter for general adult medical examination with abnormal findings   [facility] [place], Coralie Keens, NP   10 months ago Hyponatremia   [facility] [place], Coralie Keens, NP   1 year ago Urinary urgency   [facility] [place], [place] W, NP   1 year ago Type 2 diabetes mellitus with hyperglycemia, without long-term current use of insulin [facility])   [facility] [place], [place], NP               ezetimibe (ZETIA) 10 MG tablet [Pharmacy Med Name: EZETIMIBE 10 MG TABLET] 90 tablet 1    Sig: TAKE 1 TABLET BY MOUTH EVERY DAY     Cardiovascular:  Antilipid - Sterol Transport Inhibitors Failed - 01/21/2022  2:05 AM      Failed - Lipid Panel in normal range within the last 12 months    Cholesterol  Date Value Ref Range Status  10/01/2021 149  <200 mg/dL Final   LDL Cholesterol (Calc)  Date Value Ref Range Status  10/01/2021  mg/dL (calc) Final    Comment:    . LDL cholesterol not calculated. Triglyceride levels greater than 400 mg/dL invalidate calculated LDL results. . Reference range: <100 . Desirable range <100 mg/dL for primary prevention;   <70 mg/dL for patients with CHD or diabetic patients  with > or = 2 CHD risk factors. Marland Kitchen LDL-C is now calculated using the Martin-Hopkins  calculation, which is a validated novel method providing  better accuracy than the Friedewald equation in the  estimation of LDL-C.  Cresenciano Genre et al. Annamaria Helling. 6144;315(40): 2061-2068  (http://education.QuestDiagnostics.com/faq/FAQ164)    Direct LDL  Date Value Ref Range Status  12/13/2015 115.0 mg/dL Final    Comment:    Optimal:  <100 mg/dLNear or Above Optimal:  100-129 mg/dLBorderline High:  130-159 mg/dLHigh:  160-189 mg/dLVery High:  >190 mg/dL   HDL  Date Value Ref Range Status  10/01/2021 33 (L) > OR = 50 mg/dL Final   Triglycerides  Date Value Ref Range Status  10/01/2021 423 (H) <150 mg/dL Final    Comment:    . If a non-fasting specimen was collected, consider repeat triglyceride testing on a fasting specimen if clinically indicated.  Yates Decamp et al. J. of Clin. Lipidol. 0786;7:544-920. Marland Kitchen          Passed - AST in normal range and within 360 days    AST  Date Value Ref Range Status  10/01/2021 23 10 - 35 U/L Final         Passed - ALT in normal range and within 360 days    ALT  Date Value Ref Range Status  10/01/2021 24 6 - 29 U/L Final         Passed - Patient is not pregnant      Passed - Valid encounter within last 12 months    Recent Outpatient Visits           3 months ago Type 2 diabetes mellitus with hyperglycemia, without long-term current use of insulin ([place])   [facility], [facility] NP   6 months ago Encounter for general adult medical examination with abnormal findings    [facility] [place], Coralie Keens, NP   10 months ago Hyponatremia   [facility] [place], Coralie Keens, NP   1 year ago Urinary urgency   [facility] [place], [place], NP   1 year ago Type 2 diabetes mellitus with hyperglycemia, without long-term current use of insulin [facility])   [facility] Kathrine Haddock, NP

## 2022-02-03 DIAGNOSIS — H2513 Age-related nuclear cataract, bilateral: Secondary | ICD-10-CM | POA: Diagnosis not present

## 2022-02-03 DIAGNOSIS — H40023 Open angle with borderline findings, high risk, bilateral: Secondary | ICD-10-CM | POA: Diagnosis not present

## 2022-02-03 DIAGNOSIS — H4322 Crystalline deposits in vitreous body, left eye: Secondary | ICD-10-CM | POA: Diagnosis not present

## 2022-02-03 DIAGNOSIS — E119 Type 2 diabetes mellitus without complications: Secondary | ICD-10-CM | POA: Diagnosis not present

## 2022-02-28 ENCOUNTER — Ambulatory Visit
Admission: EM | Admit: 2022-02-28 | Discharge: 2022-02-28 | Disposition: A | Discharge status: Home / Self Care | Payer: Medicare HMO | Attending: Emergency Medicine | Admitting: Emergency Medicine

## 2022-02-28 ENCOUNTER — Encounter: Payer: Self-pay | Admitting: Emergency Medicine

## 2022-02-28 DIAGNOSIS — N390 Urinary tract infection, site not specified: Secondary | ICD-10-CM | POA: Insufficient documentation

## 2022-02-28 LAB — URINALYSIS, ROUTINE W REFLEX MICROSCOPIC
Bilirubin Urine: NEGATIVE
Glucose, UA: NEGATIVE mg/dL
Ketones, ur: NEGATIVE mg/dL
Nitrite: POSITIVE — AB
Protein, ur: 30 mg/dL — AB
Specific Gravity, Urine: 1.02 (ref 1.005–1.030)
pH: 6 (ref 5.0–8.0)

## 2022-02-28 LAB — URINALYSIS, MICROSCOPIC (REFLEX): WBC, UA: >50 WBC/hpf (ref 0–5)

## 2022-02-28 MED ORDER — NITROFURANTOIN MONOHYD MACRO 100 MG PO CAPS: 100.0000 mg | ORAL_CAPSULE | Freq: Two times a day (BID) | ORAL | 0 refills | Status: DC

## 2022-02-28 MED ORDER — PHENAZOPYRIDINE HCL 200 MG PO TABS: 200.0000 mg | ORAL_TABLET | Freq: Three times a day (TID) | ORAL | 0 refills | Status: DC

## 2022-02-28 NOTE — ED Triage Notes (Signed)
Patient reports dysuria, urinary frequency, and cloudy urine since Wed.  Patient denies fevers.

## 2022-02-28 NOTE — Discharge Instructions (Signed)

## 2022-02-28 NOTE — ED Provider Notes (Addendum)
[facility]    CSN: [id number] Arrival date & time: 02/28/22  6144      History   Chief Complaint Chief Complaint  Patient presents with   Dysuria   Urinary Frequency    HPI Maria Sloan is a 71 y.o. female.   HPI  71 year old female here for evaluation of urinary complaints.  The patient reports that her symptoms began 3 days ago and they consist of burning with urination and urinary frequency.  She also states that her urine has been cloudy but she denies seeing any blood.  This morning she had some low back pain that resolved after an hour.  Patient denies any fever, abdominal pain, nausea, or vomiting.  Past Medical History:  Diagnosis Date   Anxiety    Arthritis    Cervical cancer ([place])    Hyperlipidemia    PVD (peripheral vascular disease) ([place])    R leg venous insufficiency   PVD (peripheral vascular disease) ([place])    Shingles    Type 2 diabetes mellitus ([place])     Patient Active Problem List   Diagnosis Date Noted   Chronic pain of right knee 10/01/2021   Class 1 obesity due to excess calories with body mass index (BMI) of 30.0 to 30.9 in adult 06/26/2021   Vertigo 06/04/2020   Osteopenia 04/24/2019   Microalbuminuria due to type 2 diabetes mellitus ([place]) 11/22/2018   Mixed dyslipidemia 04/05/2015   Anxiety and depression 12/18/2014   Type 2 diabetes mellitus with hyperglycemia ([place]) 12/18/2014    Past Surgical History:  Procedure Laterality Date   PARTIAL HYSTERECTOMY     ovaries remain   ROTATOR CUFF REPAIR Right     OB History   No obstetric history on file.      Home Medications    Prior to Admission medications   Medication Sig Start Date End Date Taking? Authorizing Provider  nitrofurantoin, macrocrystal-monohydrate, (MACROBID) 100 MG capsule Take 1 capsule (100 mg total) by mouth 2 (two) times daily. 02/28/22  Yes Margarette Canada, NP  phenazopyridine (PYRIDIUM) 200 MG tablet Take 1 tablet (200 mg total) by mouth 3 (three)  times daily. 02/28/22  Yes Margarette Canada, NP  Accu-Chek Softclix Lancets lancets ONE TIME DAILY AS DIRECTED. FOR ICD-10 E10.9, E11.9 A1C 02/11/2018- 11.8% 07/30/19   Malfi, Lupita Raider, FNP  atorvastatin (LIPITOR) 20 MG tablet Take 1 tablet (20 mg total) by mouth daily. 10/03/21   Jearld Fenton, NP  Blood Glucose Monitoring Suppl (ACCU-CHEK AVIVA PLUS) w/Device KIT 1 Device by Does not apply route as directed. . One time daily as directed. FOR ICD-10 E10.9, E11.9 A1C 02/11/2018- 11.8% 02/15/18   Mikey College, NP  busPIRone (BUSPAR) 5 MG tablet TAKE 1 TABLET BY MOUTH EVERYDAY AT BEDTIME 08/08/20   Jearld Fenton, NP  CALCIUM CITRATE PO Take 1 tablet by mouth daily.     [provider]  Cholecalciferol (VITAMIN D PO) Take 1 tablet by mouth daily.     [provider]  citalopram (CELEXA) 20 MG tablet TAKE 1 TABLET BY MOUTH EVERY DAY 03/10/20   Malfi, Lupita Raider, FNP  ezetimibe (ZETIA) 10 MG tablet TAKE 1 TABLET BY MOUTH EVERY DAY 01/21/22   Jearld Fenton, NP  fluticasone (FLONASE) 50 MCG/ACT nasal spray PLACE 2 SPRAYS INTO BOTH NOSTRILS DAILY. USE FOR 4-6 WEEKS THEN STOP AND USE AS NEEDED 08/17/19   Malfi, Lupita Raider, FNP  glimepiride (AMARYL) 2 MG tablet TAKE 1 TABLET BY MOUTH EVERY  DAY WITH BREAKFAST 11/04/21   Jearld Fenton, NP  glucose blood (ACCU-CHEK AVIVA PLUS) test strip USE UP TO ONE TIME AS DIRECTED 10/23/21   Jearld Fenton, NP  lisinopril (ZESTRIL) 5 MG tablet TAKE 1 TABLET (5 MG TOTAL) BY MOUTH DAILY. 01/21/22   Jearld Fenton, NP  meloxicam (MOBIC) 15 MG tablet Take 15 mg by mouth daily.  07/21/19   [provider]  metFORMIN (GLUCOPHAGE) 1000 MG tablet TAKE 1 TABLET (1,000 MG TOTAL) BY MOUTH TWICE A DAY WITH FOOD 12/23/21   Jearld Fenton, NP  methocarbamol (ROBAXIN) 500 MG tablet Take 500 mg by mouth at bedtime.  07/21/19   [provider]  Multiple Vitamin (MULTIVITAMIN) tablet Take 1 tablet by mouth daily.    [provider]  Omega-3 Fatty  Acids (FISH OIL) 1000 MG CAPS Take 1,000 mg by mouth 3 (three) times daily.    [provider]  Potassium 75 MG TABS Take 1 tablet by mouth daily.    [provider]    Family History Family History  Problem Relation Age of Onset   Stroke Mother    Emphysema Father    Lung cancer Father    Hyperlipidemia Father    Hyperlipidemia Sister    Healthy Son    Colon cancer Neg Hx    Breast cancer Neg Hx    Ovarian cancer Neg Hx     Social History Social History   Tobacco Use   Smoking status: Former    Packs/day: 1.00    Years: 25.00    Total pack years: 25.00    Types: Cigarettes    Quit date: 11/29/1994    Years since quitting: 27.2   Smokeless tobacco: Never  Vaping Use   Vaping Use: Never used  Substance Use Topics   Alcohol use: Yes    Alcohol/week: 0.0 standard drinks of alcohol    Comment: occasionally   Drug use: Never     Allergies   Prochlorperazine, Other, Actifed cold-allergy [chlorpheniramine-phenylephrine], and Penicillins   Review of Systems Review of Systems  Constitutional:  Negative for fever.  Gastrointestinal:  Negative for abdominal pain, nausea and vomiting.  Genitourinary:  Positive for dysuria and frequency. Negative for hematuria and urgency.  Musculoskeletal:  Positive for back pain.     Physical Exam Triage Vital Signs ED Triage Vitals  Enc Vitals Group     BP 02/28/22 1013 128/67     Pulse Rate 02/28/22 1013 75     Resp 02/28/22 1013 14     Temp 02/28/22 1013 98.4 F (36.9 C)     Temp Source 02/28/22 1013 Oral     SpO2 02/28/22 1013 96 %     Weight 02/28/22 1010 180 lb (81.6 kg)     Height 02/28/22 1010 _0  (1.651 m)     Head Circumference --      Peak Flow --      Pain Score 02/28/22 1010 4     Pain Loc --      Pain Edu? --      Excl. in [place]? --    No data found.  Updated Vital Signs BP 128/67 (BP Location: Left Arm)   Pulse 75   Temp 98.4 F (36.9 C) (Oral)   Resp 14   Ht _1  (1.651 m)   Wt  180 lb (81.6 kg)   SpO2 96%   BMI 29.95 kg/m   Visual Acuity Right Eye Distance:  Left Eye Distance:   Bilateral Distance:    Right Eye Near:   Left Eye Near:    Bilateral Near:     Physical Exam Vitals and nursing note reviewed.  Constitutional:      Appearance: Normal appearance. She is not ill-appearing.  HENT:     Head: Normocephalic and atraumatic.  Cardiovascular:     Rate and Rhythm: Normal rate and regular rhythm.     Pulses: Normal pulses.     Heart sounds: Normal heart sounds. No murmur heard.    No friction rub. No gallop.  Pulmonary:     Effort: Pulmonary effort is normal.     Breath sounds: Normal breath sounds. No wheezing, rhonchi or rales.  Abdominal:     General: Abdomen is flat.     Palpations: Abdomen is soft.     Tenderness: There is no abdominal tenderness. There is no right CVA tenderness or left CVA tenderness.  Skin:    General: Skin is warm.     Capillary Refill: Capillary refill takes less than 2 seconds.  Neurological:     General: No focal deficit present.     Mental Status: She is alert and oriented to person, place, and time.  Psychiatric:        Mood and Affect: Mood normal.        Behavior: Behavior normal.        Thought Content: Thought content normal.        Judgment: Judgment normal.      UC Treatments / Results  Labs (all labs ordered are listed, but only abnormal results are displayed) Labs Reviewed  URINALYSIS, ROUTINE W REFLEX MICROSCOPIC - Abnormal; Notable for the following components:      Result Value   APPearance CLOUDY (*)    Hgb urine dipstick MODERATE (*)    Protein, ur 30 (*)    Nitrite POSITIVE (*)    Leukocytes,Ua LARGE (*)    All other components within normal limits  URINALYSIS, MICROSCOPIC (REFLEX) - Abnormal; Notable for the following components:   Bacteria, UA MANY (*)    All other components within normal limits  URINE CULTURE    EKG   Radiology No results found.  Procedures Procedures  (including critical care time)  Medications Ordered in UC Medications - No data to display  Initial Impression / Assessment and Plan / UC Course  I have reviewed the triage vital signs and the nursing notes.  Pertinent labs & imaging results that were available during my care of the patient were reviewed by me and considered in my medical decision making (see chart for details).   Patient is a pleasant, nontoxic-appearing 71 year old female who presents for evaluation of dysuria, urinary frequency, and cloudy urine x 3 days.  She did have an episode of low back pain this morning that resolved after an hour.  No associate abdominal pain, fever, hematuria, nausea, or vomiting.  On exam her abdomen is flat soft without tenderness and she has no CVA tenderness on exam.  I will order urinalysis to look for the presence of UTI.  Urinalysis reports cloudy appearance with moderate hemoglobin, 30 protein, positive nitrates, and large leukocyte esterase.  The reflex microscopy shows 21-50 RBCs, >50 WBCs, many bacteria, and also calcium oxalate crystals.  I will send the urine for culture.  The patient denies any upper back pain and denies any history of kidney stones.  Her urine definitely shows a infection and the calcium actually crystals may  be a result of that.  I will culture her urine and start her on Macrobid twice daily for 5 days.  I will also prescribe diarrhea and help with the urinary discomfort.  I have encouraged her to increase oral fluid intake so that she increases her urine production and flush her urinary tract.  If she develops any new or worsening symptoms she should return for reevaluation.  Patient verbalized understanding of same.   Final Clinical Impressions(s) / UC Diagnoses   Final diagnoses:  Lower urinary tract infectious disease     Discharge Instructions      Take the Macrobid twice daily for 5 days with food for treatment of urinary tract infection.  Use the  Pyridium every 8 hours as needed for urinary discomfort.  This will turn your urine a bright red-orange.  Increase your oral fluid intake so that you increase your urine production and or flushing your urinary system.  Take an over-the-counter probiotic, such as Culturelle-Align-Activia, 1 hour after each dose of antibiotic to prevent diarrhea or yeast infections from forming.  We will culture urine and change the antibiotics if necessary.  Return for reevaluation, or see your primary care provider, for any new or worsening symptoms.      ED Prescriptions     Medication Sig Dispense Auth. Provider   nitrofurantoin, macrocrystal-monohydrate, (MACROBID) 100 MG capsule Take 1 capsule (100 mg total) by mouth 2 (two) times daily. 10 capsule Margarette Canada, NP   phenazopyridine (PYRIDIUM) 200 MG tablet Take 1 tablet (200 mg total) by mouth 3 (three) times daily. 6 tablet Margarette Canada, NP      PDMP not reviewed this encounter.   Margarette Canada, NP 02/28/22 [place]    Margarette Canada, NP 02/28/22 [phone]

## 2022-03-02 LAB — URINE CULTURE: Culture: 30000 — AB

## 2022-04-02 ENCOUNTER — Other Ambulatory Visit: Payer: Self-pay | Admitting: Internal Medicine

## 2022-04-02 NOTE — Telephone Encounter (Signed)
Requested Prescriptions  Pending Prescriptions Disp Refills   atorvastatin (LIPITOR) 20 MG tablet [Pharmacy Med Name: ATORVASTATIN 20 MG TABLET] 90 tablet 0    Sig: TAKE 1 TABLET BY MOUTH EVERY DAY     Cardiovascular:  Antilipid - Statins Failed - 04/02/2022  1:45 AM      Failed - Lipid Panel in normal range within the last 12 months    Cholesterol  Date Value Ref Range Status  10/01/2021 149 <200 mg/dL Final   LDL Cholesterol (Calc)  Date Value Ref Range Status  10/01/2021  mg/dL (calc) Final    Comment:    . LDL cholesterol not calculated. Triglyceride levels greater than 400 mg/dL invalidate calculated LDL results. . Reference range: <100 . Desirable range <100 mg/dL for primary prevention;   <70 mg/dL for patients with CHD or diabetic patients  with > or = 2 CHD risk factors. Marland Kitchen LDL-C is now calculated using the Martin-Hopkins  calculation, which is a validated novel method providing  better accuracy than the Friedewald equation in the  estimation of LDL-C.  Cresenciano Genre et al. Annamaria Helling. 3267;124(58): 2061-2068  (http://education.[employer].com/faq/FAQ164)    Direct LDL  Date Value Ref Range Status  12/13/2015 115.0 mg/dL Final    Comment:    Optimal:  <100 mg/dLNear or Above Optimal:  100-129 mg/dLBorderline High:  130-159 mg/dLHigh:  160-189 mg/dLVery High:  >190 mg/dL   HDL  Date Value Ref Range Status  10/01/2021 33 (L) > OR = 50 mg/dL Final   Triglycerides  Date Value Ref Range Status  10/01/2021 423 (H) <150 mg/dL Final    Comment:    . If a non-fasting specimen was collected, consider repeat triglyceride testing on a fasting specimen if clinically indicated.  Yates Decamp et al. J. of Clin. Lipidol. 0998;3:382-505. Marland Kitchen          Passed - Patient is not pregnant      Passed - Valid encounter within last 12 months    Recent Outpatient Visits           6 months ago Type 2 diabetes mellitus with hyperglycemia, without long-term current use of insulin [facility])    [facility] [place], Coralie Keens, NP   9 months ago Encounter for general adult medical examination with abnormal findings   [facility] [place], Coralie Keens, NP   1 year ago Hyponatremia   [facility] [place], Coralie Keens, NP   1 year ago Urinary urgency   [facility] [place], [place] W, NP   1 year ago Type 2 diabetes mellitus with hyperglycemia, without long-term current use of insulin [facility])   [facility] Kathrine Haddock, [place]

## 2022-04-23 ENCOUNTER — Other Ambulatory Visit: Payer: Self-pay | Admitting: Internal Medicine

## 2022-04-23 DIAGNOSIS — E1165 Type 2 diabetes mellitus with hyperglycemia: Secondary | ICD-10-CM

## 2022-04-23 DIAGNOSIS — E1129 Type 2 diabetes mellitus with other diabetic kidney complication: Secondary | ICD-10-CM

## 2022-04-23 NOTE — Telephone Encounter (Signed)
Called pt - left message on machine to call back to schedule office visit.

## 2022-04-23 NOTE — Telephone Encounter (Signed)
Courtesy refill. Patient will need an office visit for further refills. Requested Prescriptions  Pending Prescriptions Disp Refills   lisinopril (ZESTRIL) 5 MG tablet [Pharmacy Med Name: LISINOPRIL 5 MG TABLET] 30 tablet 0    Sig: TAKE 1 TABLET (5 MG TOTAL) BY MOUTH DAILY.     Cardiovascular:  ACE Inhibitors Failed - 04/23/2022  2:23 AM      Failed - Cr in normal range and within 180 days    Creat  Date Value Ref Range Status  10/01/2021 0.71 0.60 - 1.00 mg/dL Final   Creatinine,U  Date Value Ref Range Status  12/13/2015 153.6 mg/dL Final   Creatinine, Urine  Date Value Ref Range Status  06/26/2021 84 20 - 275 mg/dL Final         Failed - K in normal range and within 180 days    Potassium  Date Value Ref Range Status  10/01/2021 4.4 3.5 - 5.3 mmol/L Final         Failed - Valid encounter within last 6 months    Recent Outpatient Visits           6 months ago Type 2 diabetes mellitus with hyperglycemia, without long-term current use of insulin [facility])   [facility] [place], Coralie Keens, NP   10 months ago Encounter for general adult medical examination with abnormal findings   [facility] [place], Coralie Keens, NP   1 year ago Hyponatremia   [facility] [place], Coralie Keens, NP   1 year ago Urinary urgency   [facility] [place], [place] W, NP   1 year ago Type 2 diabetes mellitus with hyperglycemia, without long-term current use of insulin [facility])   [place], [place]              Passed - Patient is not pregnant      Passed - Last BP in normal range    BP Readings from Last 1 Encounters:  02/28/22 128/67

## 2022-05-05 ENCOUNTER — Ambulatory Visit (INDEPENDENT_AMBULATORY_CARE_PROVIDER_SITE_OTHER): Payer: Medicare HMO | Admitting: Internal Medicine

## 2022-05-05 ENCOUNTER — Encounter: Payer: Self-pay | Admitting: Internal Medicine

## 2022-05-05 VITALS — BP 126/74 | HR 77 | Temp 96.3°F | Wt 173.0 lb

## 2022-05-05 DIAGNOSIS — E785 Hyperlipidemia, unspecified: Secondary | ICD-10-CM

## 2022-05-05 DIAGNOSIS — F32A Depression, unspecified: Secondary | ICD-10-CM

## 2022-05-05 DIAGNOSIS — M25561 Pain in right knee: Secondary | ICD-10-CM | POA: Diagnosis not present

## 2022-05-05 DIAGNOSIS — E1129 Type 2 diabetes mellitus with other diabetic kidney complication: Secondary | ICD-10-CM | POA: Diagnosis not present

## 2022-05-05 DIAGNOSIS — E1165 Type 2 diabetes mellitus with hyperglycemia: Secondary | ICD-10-CM

## 2022-05-05 DIAGNOSIS — R8290 Unspecified abnormal findings in urine: Secondary | ICD-10-CM

## 2022-05-05 DIAGNOSIS — E1169 Type 2 diabetes mellitus with other specified complication: Secondary | ICD-10-CM

## 2022-05-05 DIAGNOSIS — Z6828 Body mass index (BMI) 28.0-28.9, adult: Secondary | ICD-10-CM

## 2022-05-05 DIAGNOSIS — R42 Dizziness and giddiness: Secondary | ICD-10-CM

## 2022-05-05 DIAGNOSIS — M8588 Other specified disorders of bone density and structure, other site: Secondary | ICD-10-CM

## 2022-05-05 DIAGNOSIS — G8929 Other chronic pain: Secondary | ICD-10-CM

## 2022-05-05 DIAGNOSIS — E663 Overweight: Secondary | ICD-10-CM

## 2022-05-05 DIAGNOSIS — F419 Anxiety disorder, unspecified: Secondary | ICD-10-CM

## 2022-05-05 DIAGNOSIS — R809 Proteinuria, unspecified: Secondary | ICD-10-CM

## 2022-05-05 LAB — POCT URINALYSIS DIPSTICK
Bilirubin, UA: NEGATIVE
Blood, UA: NEGATIVE
Glucose, UA: NEGATIVE
Ketones, UA: NEGATIVE
Nitrite, UA: NEGATIVE
Protein, UA: NEGATIVE
Spec Grav, UA: 1.02 (ref 1.010–1.025)
Urobilinogen, UA: 0.2 E.U./dL
pH, UA: 6 (ref 5.0–8.0)

## 2022-05-05 LAB — POCT GLYCOSYLATED HEMOGLOBIN (HGB A1C): Hemoglobin A1C: 8.3 % — AB (ref 4.0–5.6)

## 2022-05-05 MED ORDER — OZEMPIC (0.25 OR 0.5 MG/DOSE) 2 MG/3ML ~~LOC~~ SOPN: 0.5000 mg | PEN_INJECTOR | SUBCUTANEOUS | 1 refills | Status: DC

## 2022-05-05 NOTE — Assessment & Plan Note (Signed)
Encourage regular physical activity Can take Tylenol arthritis OTC as needed

## 2022-05-05 NOTE — Assessment & Plan Note (Signed)
Continue calcium and vitamin D Encourage daily weightbearing exercise

## 2022-05-05 NOTE — Assessment & Plan Note (Signed)
C-Met and lipid profile today Encouraged to consume low-fat diet Continue atorvastatin and ezetimibe

## 2022-05-05 NOTE — Assessment & Plan Note (Signed)
Continue Epley maneuver and meclizine as needed

## 2022-05-05 NOTE — Assessment & Plan Note (Signed)
Continue lisinopril for renal protection

## 2022-05-05 NOTE — Patient Instructions (Signed)

## 2022-05-05 NOTE — Assessment & Plan Note (Signed)
POCT A1c 8.3% Urine microalbumin has been checked within the last year Encouraged her to consume a low-carb diet and exercise for weight loss Continue metformin and glimepiride We will add Ozempic Encourage routine eye exam Encouraged complete exam Immunizations UTD

## 2022-05-05 NOTE — Progress Notes (Signed)
Subjective:    Patient ID: Maria Sloan, female    DOB: 12-15-1950, 72 y.o.   MRN: [id number]  HPI  Patient presents to clinic today for follow-up of chronic conditions.  HLD: Her last LDL was not calculated, triglycerides 423, 10/2018.Marland Kitchen  She denies myalgias on Atorvastatin and Ezetimibe.  She tries to consume low-fat diet.  DM2: Her last A1c was 7.1%, 09/2021.  She is taking Metformin, Glimepiride as prescribed.  She is on Lisinopril for renal protection.  Her sugars range 140-180. She checks her feet as needed.  Her last eye exam was 06/2021.  Flu 12/2021.  Pneumovax 12/2019.  Prevnar 12/2017.  [place] x 3.  Anxiety and Depression: Chronic, managed on Citalopram and Buspirone. She has been feeling more depressed in the last 2 weeks due to a family situation. She is not currently seeing a therapist.  She denies SI/HI.  Osteopenia: She is taking Calcium and Vitamin D OTC.  She tries to get weightbearing exercise daily.  Bone density from 08/2021 reviewed.  OA: Mainly in back and her right knee.  She is taking Methocarbamol with minimal relief of symptoms. She has had a steroid injection in her knee. She does follow with orthopedics.  Vertigo: Intermittent.  She typically manages this with the Epley maneuver and takes Meclizine as needed with good relief of symptoms.  Review of Systems     Past Medical History:  Diagnosis Date   Anxiety    Arthritis    Cervical cancer ([place])    Hyperlipidemia    PVD (peripheral vascular disease) (HCC)    R leg venous insufficiency   PVD (peripheral vascular disease) (HCC)    Shingles    Type 2 diabetes mellitus (HCC)     Current Outpatient Medications  Medication Sig Dispense Refill   Accu-Chek Softclix Lancets lancets ONE TIME DAILY AS DIRECTED. FOR ICD-10 E10.9, E11.9 A1C 02/11/2018- 11.8% 100 each 12   atorvastatin (LIPITOR) 20 MG tablet TAKE 1 TABLET BY MOUTH EVERY DAY 90 tablet 0   Blood Glucose Monitoring Suppl (ACCU-CHEK AVIVA PLUS)  w/Device KIT 1 Device by Does not apply route as directed. . One time daily as directed. FOR ICD-10 E10.9, E11.9 A1C 02/11/2018- 11.8% 1 kit 0   busPIRone (BUSPAR) 5 MG tablet TAKE 1 TABLET BY MOUTH EVERYDAY AT BEDTIME 90 tablet 1   CALCIUM CITRATE PO Take 1 tablet by mouth daily.      Cholecalciferol (VITAMIN D PO) Take 1 tablet by mouth daily.      citalopram (CELEXA) 20 MG tablet TAKE 1 TABLET BY MOUTH EVERY DAY 90 tablet 2   ezetimibe (ZETIA) 10 MG tablet TAKE 1 TABLET BY MOUTH EVERY DAY 90 tablet 1   fluticasone (FLONASE) 50 MCG/ACT nasal spray PLACE 2 SPRAYS INTO BOTH NOSTRILS DAILY. USE FOR 4-6 WEEKS THEN STOP AND USE AS NEEDED 48 mL 0   glimepiride (AMARYL) 2 MG tablet TAKE 1 TABLET BY MOUTH EVERY DAY WITH BREAKFAST 90 tablet 1   glucose blood (ACCU-CHEK AVIVA PLUS) test strip USE UP TO ONE TIME AS DIRECTED 100 strip 12   lisinopril (ZESTRIL) 5 MG tablet TAKE 1 TABLET (5 MG TOTAL) BY MOUTH DAILY. 30 tablet 0   meloxicam (MOBIC) 15 MG tablet Take 15 mg by mouth daily.      metFORMIN (GLUCOPHAGE) 1000 MG tablet TAKE 1 TABLET (1,000 MG TOTAL) BY MOUTH TWICE A DAY WITH FOOD 180 tablet 1   methocarbamol (ROBAXIN) 500 MG tablet Take 500 mg by  mouth at bedtime.      Multiple Vitamin (MULTIVITAMIN) tablet Take 1 tablet by mouth daily.     nitrofurantoin, macrocrystal-monohydrate, (MACROBID) 100 MG capsule Take 1 capsule (100 mg total) by mouth 2 (two) times daily. 10 capsule 0   Omega-3 Fatty Acids (FISH OIL) 1000 MG CAPS Take 1,000 mg by mouth 3 (three) times daily.     phenazopyridine (PYRIDIUM) 200 MG tablet Take 1 tablet (200 mg total) by mouth 3 (three) times daily. 6 tablet 0   Potassium 75 MG TABS Take 1 tablet by mouth daily.     No current facility-administered medications for this visit.    Allergies  Allergen Reactions   Prochlorperazine Other (See Comments)    Compazine   Other     Other reaction(s): Dizziness   Actifed Cold-Allergy [Chlorpheniramine-Phenylephrine]     Ok  with Robitussin, Mucinex   Penicillins     Family History  Problem Relation Age of Onset   Stroke Mother    Emphysema Father    Lung cancer Father    Hyperlipidemia Father    Hyperlipidemia Sister    Healthy Son    Colon cancer Neg Hx    Breast cancer Neg Hx    Ovarian cancer Neg Hx     Social History   Socioeconomic History   Marital status: Divorced    Spouse name: Not on file   Number of children: Not on file   Years of education: Not on file   Highest education level: Bachelor's degree (e.g., BA, AB, BS)  Occupational History   Occupation: retired   Tobacco Use   Smoking status: Former    Packs/day: 1.00    Years: 25.00    Total pack years: 25.00    Types: Cigarettes    Quit date: 11/29/1994    Years since quitting: 27.4   Smokeless tobacco: Never  Vaping Use   Vaping Use: Never used  Substance and Sexual Activity   Alcohol use: Yes    Alcohol/week: 0.0 standard drinks of alcohol    Comment: occasionally   Drug use: Never   Sexual activity: Not Currently    Birth control/protection: None  Other Topics Concern   Not on file  Social History Narrative   Divorced.   1 son.    Moved from [place].   Enjoys making jewelry, spending time with family, crafts, reading.   Social Determinants of Health   Financial Resource Strain: Low Risk  (06/05/2021)   Overall Financial Resource Strain (CARDIA)    Difficulty of Paying Living Expenses: Not hard at all  Food Insecurity: No Food Insecurity (06/05/2021)   Hunger Vital Sign    Worried About Running Out of Food in the Last Year: Never true    Ran Out of Food in the Last Year: Never true  Transportation Needs: No Transportation Needs (06/05/2021)   PRAPARE - Hydrologist (Medical): No    Lack of Transportation (Non-Medical): No  Physical Activity: Insufficiently Active (06/05/2021)   Exercise Vital Sign    Days of Exercise per Week: 3 days    Minutes of Exercise per Session: 30 min  Stress:  No Stress Concern Present (06/05/2021)   [place]    Feeling of Stress : Only a little  Social Connections: Socially Isolated (06/05/2021)   Social Connection and Isolation Panel [NHANES]    Frequency of Communication with Friends and Family: More than three times a  week    Frequency of Social Gatherings with Friends and Family: More than three times a week    Attends Religious Services: Never    Marine scientist or Organizations: No    Attends Archivist Meetings: Never    Marital Status: Divorced  Human resources officer Violence: Not At Risk (06/05/2021)   Humiliation, Afraid, Rape, and Kick questionnaire    Fear of Current or Ex-Partner: No    Emotionally Abused: No    Physically Abused: No    Sexually Abused: No     Constitutional: Denies fever, malaise, fatigue, headache or abrupt weight changes.  HEENT: Denies eye pain, eye redness, ear pain, ringing in the ears, wax buildup, runny nose, nasal congestion, bloody nose, or sore throat. Respiratory: Denies difficulty breathing, shortness of breath, cough or sputum production.   Cardiovascular: Denies chest pain, chest tightness, palpitations or swelling in the hands or feet.  Gastrointestinal: Denies abdominal pain, bloating, constipation, diarrhea or blood in the stool.  GU: Pt reports cloudy urine with odor. Denies urgency, frequency, pain with urination, burning sensation, blood in urine, or discharge. Musculoskeletal: Patient reports knee pain.  Denies decrease in range of motion, difficulty with gait, muscle pain or joint pain and swelling.  Skin: Denies redness, rashes, lesions or ulcercations.  Neurological: Patient reports intermittent dizziness.  Denies difficulty with memory, difficulty with speech or problems with balance and coordination.  Psych: Patient has a history of anxiety and depression.  Denies SI/HI.  No other specific complaints in a  complete review of systems (except as listed in HPI above).  Objective:   Physical Exam BP 126/74 (BP Location: Left Arm, Patient Position: Sitting, Cuff Size: Normal)   Pulse 77   Temp (!) 96.3 F (35.7 C) (Temporal)   Wt 173 lb (78.5 kg)   SpO2 98%   BMI 28.79 kg/m   Wt Readings from Last 3 Encounters:  02/28/22 180 lb (81.6 kg)  10/01/21 183 lb (83 kg)  06/26/21 183 lb (83 kg)    General: Appears her  stated age, overweight, in NAD. Skin: Warm, dry and intact. No rashes noted. HEENT: Head: normal shape and size; Eyes: sclera white, no icterus, conjunctiva pink, PERRLA and EOMs intact;  Cardiovascular: Normal rate and rhythm. S1,S2 noted.  No murmur, rubs or gallops noted. No JVD or BLE edema. No carotid bruits noted. Pulmonary/Chest: Normal effort and positive vesicular breath sounds. No respiratory distress. No wheezes, rales or ronchi noted.  Musculoskeletal: Joint enlargement noted of right knee.  No pain with palpation of the right knee.  Strength 5/5 BLE.  No difficulty with gait.  Neurological: Alert and oriented. Coordination normal.  Psychiatric: Mood and affect normal. Mildly anxious appearing. Judgment and thought content normal.     BMET    Component Value Date/Time   NA 139 10/01/2021 0818   K 4.4 10/01/2021 0818   CL 103 10/01/2021 0818   CO2 29 10/01/2021 0818   GLUCOSE 173 (H) 10/01/2021 0818   BUN 19 10/01/2021 0818   CREATININE 0.71 10/01/2021 0818   CALCIUM 9.4 10/01/2021 0818   GFRNONAA >60 03/06/2021 0933   GFRNONAA 94 02/11/2018 0928   GFRAA 109 02/11/2018 0928    Lipid Panel     Component Value Date/Time   CHOL 149 10/01/2021 0818   TRIG 423 (H) 10/01/2021 0818   HDL 33 (L) 10/01/2021 0818   CHOLHDL 4.5 10/01/2021 0818   LDLCALC  10/01/2021 0818     Comment:     .  LDL cholesterol not calculated. Triglyceride levels greater than 400 mg/dL invalidate calculated LDL results. . Reference range: <100 . Desirable range <100 mg/dL for  primary prevention;   <70 mg/dL for patients with CHD or diabetic patients  with > or = 2 CHD risk factors. Marland Kitchen LDL-C is now calculated using the Martin-Hopkins  calculation, which is a validated novel method providing  better accuracy than the Friedewald equation in the  estimation of LDL-C.  Cresenciano Genre et al. Annamaria Helling. [id number]): 2061-2068  (http://education.[facility].com/faq/FAQ164)     CBC    Component Value Date/Time   WBC 7.1 06/26/2021 0842   RBC 4.90 06/26/2021 0842   HGB 14.5 06/26/2021 0842   HCT 44.2 06/26/2021 0842   PLT 268 06/26/2021 0842   MCV 90.2 06/26/2021 0842   MCH 29.6 06/26/2021 0842   MCHC 32.8 06/26/2021 0842   RDW 12.8 06/26/2021 0842   LYMPHSABS 1.3 03/06/2021 0933   MONOABS 1.0 03/06/2021 0933   EOSABS 0.0 03/06/2021 0933   BASOSABS 0.0 03/06/2021 0933    Hgb A1C Lab Results  Component Value Date   HGBA1C 7.1 (A) 10/01/2021            Assessment & Plan:   Cloudy Urine, Urine Odor:  Urinalysis: 2+ leuks Will send urine culture Push fluids  RTC in 3 months for your annual exam Webb Silversmith, NP

## 2022-05-05 NOTE — Assessment & Plan Note (Signed)
Encourage diet and exercise for weight loss 

## 2022-05-05 NOTE — Assessment & Plan Note (Signed)
Stable on current dose of citalopram and buspirone Support offered

## 2022-05-06 ENCOUNTER — Other Ambulatory Visit: Payer: Self-pay | Admitting: Internal Medicine

## 2022-05-06 DIAGNOSIS — E1165 Type 2 diabetes mellitus with hyperglycemia: Secondary | ICD-10-CM

## 2022-05-06 LAB — LIPID PANEL
Cholesterol: 135 mg/dL (ref <200)
HDL: 39 mg/dL — ABNORMAL LOW (ref >=50)
LDL Cholesterol (Calc): 65 mg/dL (calc)
Non-HDL Cholesterol (Calc): 96 mg/dL (calc) (ref <130)
Total CHOL/HDL Ratio: 3.5 (calc) (ref <5.0)
Triglycerides: 247 mg/dL — ABNORMAL HIGH (ref <150)

## 2022-05-06 LAB — COMPLETE METABOLIC PANEL WITH GFR
AG Ratio: 1.5 (calc) (ref 1.0–2.5)
ALT: 22 U/L (ref 6–29)
AST: 17 U/L (ref 10–35)
Albumin: 4.3 g/dL (ref 3.6–5.1)
Alkaline phosphatase (APISO): 55 U/L (ref 37–153)
BUN: 19 mg/dL (ref 7–25)
CO2: 26 mmol/L (ref 20–32)
Calcium: 9.7 mg/dL (ref 8.6–10.4)
Chloride: 105 mmol/L (ref 98–110)
Creat: 0.72 mg/dL (ref 0.60–1.00)
Globulin: 2.8 g/dL (calc) (ref 1.9–3.7)
Glucose, Bld: 225 mg/dL — ABNORMAL HIGH (ref 65–99)
Potassium: 4.4 mmol/L (ref 3.5–5.3)
Sodium: 142 mmol/L (ref 135–146)
Total Bilirubin: 0.6 mg/dL (ref 0.2–1.2)
Total Protein: 7.1 g/dL (ref 6.1–8.1)
eGFR: 89 mL/min/{1.73_m2} (ref >=60)

## 2022-05-06 LAB — CBC
HCT: 42.9 % (ref 35.0–45.0)
Hemoglobin: 14.3 g/dL (ref 11.7–15.5)
MCH: 30 pg (ref 27.0–33.0)
MCHC: 33.3 g/dL (ref 32.0–36.0)
MCV: 90.1 fL (ref 80.0–100.0)
MPV: 11.6 fL (ref 7.5–12.5)
Platelets: 274 10*3/uL (ref 140–400)
RBC: 4.76 10*6/uL (ref 3.80–5.10)
RDW: 12.8 % (ref 11.0–15.0)
WBC: 7 10*3/uL (ref 3.8–10.8)

## 2022-05-06 NOTE — Telephone Encounter (Signed)
Medication no longer current-see OV 05/05/22 Requested Prescriptions  Pending Prescriptions Disp Refills   glimepiride (AMARYL) 2 MG tablet [Pharmacy Med Name: GLIMEPIRIDE 2 MG TABLET] 90 tablet 1    Sig: TAKE 1 TABLET BY MOUTH EVERY DAY WITH BREAKFAST     Endocrinology:  Diabetes - Sulfonylureas Failed - 05/06/2022  1:46 AM      Failed - HBA1C is between 0 and 7.9 and within 180 days    Hemoglobin A1C  Date Value Ref Range Status  05/05/2022 8.3 (A) 4.0 - 5.6 % Final   Hgb A1c MFr Bld  Date Value Ref Range Status  06/26/2021 8.0 (H) <5.7 % of total Hgb Final    Comment:    For someone without known diabetes, a hemoglobin A1c value of 6.5% or greater indicates that they may have  diabetes and this should be confirmed with a follow-up  test. . For someone with known diabetes, a value <7% indicates  that their diabetes is well controlled and a value  greater than or equal to 7% indicates suboptimal  control. A1c targets should be individualized based on  duration of diabetes, age, comorbid conditions, and  other considerations. . Currently, no consensus exists regarding use of hemoglobin A1c for diagnosis of diabetes for children. .          Passed - Cr in normal range and within 360 days    Creat  Date Value Ref Range Status  05/05/2022 0.72 0.60 - 1.00 mg/dL Final   Creatinine,U  Date Value Ref Range Status  12/13/2015 153.6 mg/dL Final   Creatinine, Urine  Date Value Ref Range Status  06/26/2021 84 20 - 275 mg/dL Final         Passed - Valid encounter within last 6 months    Recent Outpatient Visits           Yesterday Type 2 diabetes mellitus with hyperglycemia, without long-term current use of insulin [facility])   [facility] [place], [place], NP   7 months ago Type 2 diabetes mellitus with hyperglycemia, without long-term current use of insulin [facility])   [facility] [place], [place] Keens, NP   10 months ago Encounter  for general adult medical examination with abnormal findings   [facility] [place], Coralie Keens, NP   1 year ago Hyponatremia   [facility] [place], Coralie Keens, NP   1 year ago Urinary urgency   [facility] [place], [place]alie Keens, [place]

## 2022-05-07 ENCOUNTER — Encounter: Payer: Self-pay | Admitting: Internal Medicine

## 2022-05-07 LAB — URINE CULTURE
MICRO NUMBER:: 14651737
SPECIMEN QUALITY:: ADEQUATE

## 2022-05-07 MED ORDER — GLIMEPIRIDE 2 MG PO TABS: 2.0000 mg | ORAL_TABLET | Freq: Every day | ORAL | 1 refills | Status: DC

## 2022-05-07 NOTE — Addendum Note (Signed)
Addended by: Jearld Fenton on: 05/07/2022 02:11 PM   Modules accepted: Orders

## 2022-05-11 ENCOUNTER — Telehealth: Payer: Self-pay | Admitting: Internal Medicine

## 2022-05-11 NOTE — Telephone Encounter (Signed)
Called patient to schedule Medicare Annual Wellness Visit (AWV). Left message for patient to call back and schedule Medicare Annual Wellness Visit (AWV).  Last date of AWV: 06/02/21  Please schedule an appointment at any time with Kirke Shaggy, NHA  .  If any questions, please contact me.  Thank you ,  Sherol Dade; [place] Direct Dial: [phone]

## 2022-05-19 ENCOUNTER — Other Ambulatory Visit: Payer: Self-pay | Admitting: Internal Medicine

## 2022-05-19 DIAGNOSIS — E1165 Type 2 diabetes mellitus with hyperglycemia: Secondary | ICD-10-CM

## 2022-05-19 DIAGNOSIS — E1129 Type 2 diabetes mellitus with other diabetic kidney complication: Secondary | ICD-10-CM

## 2022-05-20 NOTE — Telephone Encounter (Signed)
Requested medication (s) are due for refill today: yes  Requested medication (s) are on the active medication list: yes  Last refill:  04/23/22  Future visit scheduled: no  Notes to clinic:  Unable to refill per protocol, courtesy refill already given, routing for provider approval.      Requested Prescriptions  Pending Prescriptions Disp Refills   lisinopril (ZESTRIL) 5 MG tablet [Pharmacy Med Name: LISINOPRIL 5 MG TABLET] 90 tablet 1    Sig: TAKE 1 TABLET (5 MG TOTAL) BY MOUTH DAILY.     Cardiovascular:  ACE Inhibitors Passed - 05/19/2022 11:32 AM      Passed - Cr in normal range and within 180 days    Creat  Date Value Ref Range Status  05/05/2022 0.72 0.60 - 1.00 mg/dL Final   Creatinine,U  Date Value Ref Range Status  12/13/2015 153.6 mg/dL Final   Creatinine, Urine  Date Value Ref Range Status  06/26/2021 84 20 - 275 mg/dL Final         Passed - K in normal range and within 180 days    Potassium  Date Value Ref Range Status  05/05/2022 4.4 3.5 - 5.3 mmol/L Final         Passed - Patient is not pregnant      Passed - Last BP in normal range    BP Readings from Last 1 Encounters:  05/05/22 126/74         Passed - Valid encounter within last 6 months    Recent Outpatient Visits           2 weeks ago Type 2 diabetes mellitus with hyperglycemia, without long-term current use of insulin [facility])   [facility] [place], [place], [place]   7 months ago Type 2 diabetes mellitus with hyperglycemia, without long-term current use of insulin [facility])   [facility] [place], Coralie Keens, NP   10 months ago Encounter for general adult medical examination with abnormal findings   [facility] [place], Coralie Keens, NP   1 year ago Hyponatremia   [facility] [place], Coralie Keens, NP   1 year ago Urinary urgency   [facility] [place], [place]alie Keens, [place]

## 2022-06-03 ENCOUNTER — Other Ambulatory Visit: Payer: Self-pay | Admitting: Internal Medicine

## 2022-06-03 DIAGNOSIS — E782 Mixed hyperlipidemia: Secondary | ICD-10-CM

## 2022-06-04 NOTE — Telephone Encounter (Signed)
Requested Prescriptions  Pending Prescriptions Disp Refills   glimepiride (AMARYL) 2 MG tablet [Pharmacy Med Name: GLIMEPIRIDE 2 MG TABLET] 90 tablet 1    Sig: TAKE 1 TABLET BY MOUTH EVERY DAY WITH BREAKFAST     Endocrinology:  Diabetes - Sulfonylureas Failed - 06/03/2022  5:28 PM      Failed - HBA1C is between 0 and 7.9 and within 180 days    Hemoglobin A1C  Date Value Ref Range Status  05/05/2022 8.3 (A) 4.0 - 5.6 % Final   Hgb A1c MFr Bld  Date Value Ref Range Status  06/26/2021 8.0 (H) <5.7 % of total Hgb Final    Comment:    For someone without known diabetes, a hemoglobin A1c value of 6.5% or greater indicates that they may have  diabetes and this should be confirmed with a follow-up  test. . For someone with known diabetes, a value <7% indicates  that their diabetes is well controlled and a value  greater than or equal to 7% indicates suboptimal  control. A1c targets should be individualized based on  duration of diabetes, age, comorbid conditions, and  other considerations. . Currently, no consensus exists regarding use of hemoglobin A1c for diagnosis of diabetes for children. .          Passed - Cr in normal range and within 360 days    Creat  Date Value Ref Range Status  05/05/2022 0.72 0.60 - 1.00 mg/dL Final   Creatinine,U  Date Value Ref Range Status  12/13/2015 153.6 mg/dL Final   Creatinine, Urine  Date Value Ref Range Status  06/26/2021 84 20 - 275 mg/dL Final         Passed - Valid encounter within last 6 months    Recent Outpatient Visits           1 month ago Type 2 diabetes mellitus with hyperglycemia, without long-term current use of insulin [facility])   [facility] [place], [place] W, NP   8 months ago Type 2 diabetes mellitus with hyperglycemia, without long-term current use of insulin [facility])   [facility] [place], Coralie Keens, NP   11 months ago Encounter for general adult medical examination with  abnormal findings   [facility] [place], Coralie Keens, NP   1 year ago Hyponatremia   [facility] [place], Coralie Keens, NP   1 year ago Urinary urgency   [facility] [place], [place] W, NP               atorvastatin (LIPITOR) 20 MG tablet [Pharmacy Med Name: ATORVASTATIN 20 MG TABLET] 90 tablet 0    Sig: TAKE 1 TABLET BY MOUTH EVERY DAY     Cardiovascular:  Antilipid - Statins Failed - 06/03/2022  5:28 PM      Failed - Lipid Panel in normal range within the last 12 months    Cholesterol  Date Value Ref Range Status  05/05/2022 135 <200 mg/dL Final   LDL Cholesterol (Calc)  Date Value Ref Range Status  05/05/2022 65 mg/dL (calc) Final    Comment:    Reference range: <100 . Desirable range <100 mg/dL for primary prevention;   <70 mg/dL for patients with CHD or diabetic patients  with > or = 2 CHD risk factors. Marland Kitchen LDL-C is now calculated using the Martin-Hopkins  calculation, which is a validated novel method providing  better accuracy  than the Friedewald equation in the  estimation of LDL-C.  Cresenciano Genre et al. Annamaria Helling. [id number]): 2061-2068  (http://education.[facility].com/faq/FAQ164)    Direct LDL  Date Value Ref Range Status  12/13/2015 115.0 mg/dL Final    Comment:    Optimal:  <100 mg/dLNear or Above Optimal:  100-129 mg/dLBorderline High:  130-159 mg/dLHigh:  160-189 mg/dLVery High:  >190 mg/dL   HDL  Date Value Ref Range Status  05/05/2022 39 (L) > OR = 50 mg/dL Final   Triglycerides  Date Value Ref Range Status  05/05/2022 247 (H) <150 mg/dL Final    Comment:    . If a non-fasting specimen was collected, consider repeat triglyceride testing on a fasting specimen if clinically indicated.  Yates Decamp et al. J. of Clin. Lipidol. [id number]. Marland Kitchen          Passed - Patient is not pregnant      Passed - Valid encounter within last 12 months    Recent Outpatient Visits            1 month ago Type 2 diabetes mellitus with hyperglycemia, without long-term current use of insulin ([place])   [facility] [place], [place] W, NP   8 months ago Type 2 diabetes mellitus with hyperglycemia, without long-term current use of insulin [facility])   [facility] [place], Coralie Keens, NP   11 months ago Encounter for general adult medical examination with abnormal findings   [facility] [place], Coralie Keens, NP   1 year ago Hyponatremia   [facility] [place], Coralie Keens, NP   1 year ago Urinary urgency   [facility] [place], Coralie Keens, NP               metFORMIN (GLUCOPHAGE) 1000 MG tablet [Pharmacy Med Name: METFORMIN HCL 1,000 MG TABLET] 180 tablet 1    Sig: TAKE 1 TABLET (1,000 MG TOTAL) BY MOUTH TWICE A DAY WITH FOOD     Endocrinology:  Diabetes - Biguanides Failed - 06/03/2022  5:28 PM      Failed - HBA1C is between 0 and 7.9 and within 180 days    Hemoglobin A1C  Date Value Ref Range Status  05/05/2022 8.3 (A) 4.0 - 5.6 % Final   Hgb A1c MFr Bld  Date Value Ref Range Status  06/26/2021 8.0 (H) <5.7 % of total Hgb Final    Comment:    For someone without known diabetes, a hemoglobin A1c value of 6.5% or greater indicates that they may have  diabetes and this should be confirmed with a follow-up  test. . For someone with known diabetes, a value <7% indicates  that their diabetes is well controlled and a value  greater than or equal to 7% indicates suboptimal  control. A1c targets should be individualized based on  duration of diabetes, age, comorbid conditions, and  other considerations. . Currently, no consensus exists regarding use of hemoglobin A1c for diagnosis of diabetes for children. .          Failed - B12 Level in normal range and within 720 days    No results found for: "VITAMINB12"       Failed - CBC within normal limits and completed  in the last 12 months    WBC  Date Value Ref Range Status  05/05/2022 7.0 3.8 - 10.8 Thousand/uL Final   RBC  Date Value Ref Range Status  05/05/2022 4.76 3.80 - 5.10 Million/uL Final   Hemoglobin  Date Value Ref Range Status  05/05/2022 14.3 11.7 - 15.5 g/dL Final   HCT  Date Value Ref Range Status  05/05/2022 42.9 35.0 - 45.0 % Final   MCHC  Date Value Ref Range Status  05/05/2022 33.3 32.0 - 36.0 g/dL Final   [facility]  Date Value Ref Range Status  05/05/2022 30.0 27.0 - 33.0 pg Final   MCV  Date Value Ref Range Status  05/05/2022 90.1 80.0 - 100.0 fL Final   No results found for: "PLTCOUNTKUC", "LABPLAT", "POCPLA" RDW  Date Value Ref Range Status  05/05/2022 12.8 11.0 - 15.0 % Final         Passed - Cr in normal range and within 360 days    Creat  Date Value Ref Range Status  05/05/2022 0.72 0.60 - 1.00 mg/dL Final   Creatinine,U  Date Value Ref Range Status  12/13/2015 153.6 mg/dL Final   Creatinine, Urine  Date Value Ref Range Status  06/26/2021 84 20 - 275 mg/dL Final         Passed - eGFR in normal range and within 360 days    GFR, Est African American  Date Value Ref Range Status  02/11/2018 109 > OR = 60 mL/min/1.43m2 Final   GFR, Est Non African American  Date Value Ref Range Status  02/11/2018 94 > OR = 60 mL/min/1.30m2 Final   GFR, Estimated  Date Value Ref Range Status  03/06/2021 >60 >60 mL/min Final    Comment:    (NOTE) Calculated using the CKD-EPI Creatinine Equation (2021)    GFR  Date Value Ref Range Status  07/12/2015 87.75 >60.00 mL/min Final   eGFR  Date Value Ref Range Status  05/05/2022 89 > OR = 60 mL/min/1.3m2 Final         Passed - Valid encounter within last 6 months    Recent Outpatient Visits           1 month ago Type 2 diabetes mellitus with hyperglycemia, without long-term current use of insulin ([place])   [facility] [place], [place] W, NP   8 months ago Type 2 diabetes mellitus with  hyperglycemia, without long-term current use of insulin [facility])   [facility] [place], Coralie Keens, NP   11 months ago Encounter for general adult medical examination with abnormal findings   [facility] [place], Coralie Keens, NP   1 year ago Hyponatremia   [facility] [place], Coralie Keens, NP   1 year ago Urinary urgency   [facility] [place], [place] W, NP               ezetimibe (ZETIA) 10 MG tablet [Pharmacy Med Name: EZETIMIBE 10 MG TABLET] 90 tablet 1    Sig: TAKE 1 TABLET BY MOUTH EVERY DAY     Cardiovascular:  Antilipid - Sterol Transport Inhibitors Failed - 06/03/2022  5:28 PM      Failed - Lipid Panel in normal range within the last 12 months    Cholesterol  Date Value Ref Range Status  05/05/2022 135 <200 mg/dL Final   LDL Cholesterol (Calc)  Date Value Ref Range Status  05/05/2022 65 mg/dL (calc) Final    Comment:    Reference range: <100 . Desirable range <100 mg/dL for primary prevention;   <70 mg/dL for patients with CHD or diabetic  patients  with > or = 2 CHD risk factors. Marland Kitchen LDL-C is now calculated using the Martin-Hopkins  calculation, which is a validated novel method providing  better accuracy than the Friedewald equation in the  estimation of LDL-C.  Cresenciano Genre et al. Annamaria Helling. [id number]): 2061-2068  (http://education.[facility].com/faq/FAQ164)    Direct LDL  Date Value Ref Range Status  12/13/2015 115.0 mg/dL Final    Comment:    Optimal:  <100 mg/dLNear or Above Optimal:  100-129 mg/dLBorderline High:  130-159 mg/dLHigh:  160-189 mg/dLVery High:  >190 mg/dL   HDL  Date Value Ref Range Status  05/05/2022 39 (L) > OR = 50 mg/dL Final   Triglycerides  Date Value Ref Range Status  05/05/2022 247 (H) <150 mg/dL Final    Comment:    . If a non-fasting specimen was collected, consider repeat triglyceride testing on a fasting specimen if clinically  indicated.  Yates Decamp et al. J. of Clin. Lipidol. [id number]. Marland Kitchen          Passed - AST in normal range and within 360 days    AST  Date Value Ref Range Status  05/05/2022 17 10 - 35 U/L Final         Passed - ALT in normal range and within 360 days    ALT  Date Value Ref Range Status  05/05/2022 22 6 - 29 U/L Final         Passed - Patient is not pregnant      Passed - Valid encounter within last 12 months    Recent Outpatient Visits           1 month ago Type 2 diabetes mellitus with hyperglycemia, without long-term current use of insulin [facility])   [facility] [place], [place] W, NP   8 months ago Type 2 diabetes mellitus with hyperglycemia, without long-term current use of insulin [facility])   [facility] [place], Coralie Keens, NP   11 months ago Encounter for general adult medical examination with abnormal findings   [facility] [place], Coralie Keens, NP   1 year ago Hyponatremia   [facility] [place], Coralie Keens, NP   1 year ago Urinary urgency   [facility] [place], Coralie Keens, [place]

## 2022-07-30 ENCOUNTER — Other Ambulatory Visit: Payer: Self-pay | Admitting: Internal Medicine

## 2022-07-30 DIAGNOSIS — E782 Mixed hyperlipidemia: Secondary | ICD-10-CM

## 2022-07-30 DIAGNOSIS — F419 Anxiety disorder, unspecified: Secondary | ICD-10-CM

## 2022-07-31 NOTE — Telephone Encounter (Signed)
Requested Prescriptions  Pending Prescriptions Disp Refills   ezetimibe (ZETIA) 10 MG tablet [Pharmacy Med Name: EZETIMIBE 10 MG TABLET] 90 tablet 0    Sig: TAKE 1 TABLET BY MOUTH EVERY DAY     Cardiovascular:  Antilipid - Sterol Transport Inhibitors Failed - 07/30/2022  9:50 PM      Failed - Lipid Panel in normal range within the last 12 months    Cholesterol  Date Value Ref Range Status  05/05/2022 135 <200 mg/dL Final   LDL Cholesterol (Calc)  Date Value Ref Range Status  05/05/2022 65 mg/dL (calc) Final    Comment:    Reference range: <100 . Desirable range <100 mg/dL for primary prevention;   <70 mg/dL for patients with CHD or diabetic patients  with > or = 2 CHD risk factors. Marland Kitchen LDL-C is now calculated using the Martin-Hopkins  calculation, which is a validated novel method providing  better accuracy than the Friedewald equation in the  estimation of LDL-C.  Horald Pollen et al. Lenox Ahr. 0981;191(47): 2061-2068  (http://education.[facility].com/faq/FAQ164)    Direct LDL  Date Value Ref Range Status  12/13/2015 115.0 mg/dL Final    Comment:    Optimal:  <100 mg/dLNear or Above Optimal:  100-129 mg/dLBorderline High:  130-159 mg/dLHigh:  160-189 mg/dLVery High:  >190 mg/dL   HDL  Date Value Ref Range Status  05/05/2022 39 (L) > OR = 50 mg/dL Final   Triglycerides  Date Value Ref Range Status  05/05/2022 247 (H) <150 mg/dL Final    Comment:    . If a non-fasting specimen was collected, consider repeat triglyceride testing on a fasting specimen if clinically indicated.  Perry Mount et al. J. of Clin. Lipidol. 2015;9:129-169. Marland Kitchen          Passed - AST in normal range and within 360 days    AST  Date Value Ref Range Status  05/05/2022 17 10 - 35 U/L Final         Passed - ALT in normal range and within 360 days    ALT  Date Value Ref Range Status  05/05/2022 22 6 - 29 U/L Final         Passed - Patient is not pregnant      Passed - Valid encounter within  last 12 months    Recent Outpatient Visits           2 months ago Type 2 diabetes mellitus with hyperglycemia, without long-term current use of insulin [facility])   [facility] [place], [place] W, NP   10 months ago Type 2 diabetes mellitus with hyperglycemia, without long-term current use of insulin [facility])   [place] [facility] [place], Salvadore Oxford, NP   1 year ago Encounter for general adult medical examination with abnormal findings   [facility] [place], Salvadore Oxford, NP   1 year ago Hyponatremia   [place] [facility] [place], Salvadore Oxford, NP   2 years ago Urinary urgency   [place] [facility] [place], Salvadore Oxford, NP       Future Appointments             In 4 days [place], Salvadore Oxford, NP [facility]             busPIRone (BUSPAR) 5 MG tablet [Pharmacy Med Name: BUSPIRONE HCL 5 MG TABLET] 90 tablet 1    Sig: TAKE 1  TABLET BY MOUTH EVERYDAY AT BEDTIME     Psychiatry: Anxiolytics/Hypnotics - Non-controlled Passed - 07/30/2022  9:50 PM      Passed - Valid encounter within last 12 months    Recent Outpatient Visits           2 months ago Type 2 diabetes mellitus with hyperglycemia, without long-term current use of insulin [facility])   [place] [facility] [place], [place], NP   10 months ago Type 2 diabetes mellitus with hyperglycemia, without long-term current use of insulin [facility])   [facility] [place], [place] W, NP   1 year ago Encounter for general adult medical examination with abnormal findings   [facility] [place], Salvadore Oxford, NP   1 year ago Hyponatremia   [place] [place], Salvadore Oxford, NP   2 years ago Urinary urgency   [place] [facility] [place], Salvadore Oxford, NP       Future Appointments             In 4 days [place], Salvadore Oxford, NP [place]  [facility]             glimepiride (AMARYL) 2 MG tablet [Pharmacy Med Name: GLIMEPIRIDE 2 MG TABLET] 90 tablet 1    Sig: TAKE 1 TABLET BY MOUTH EVERY DAY WITH BREAKFAST     Endocrinology:  Diabetes - Sulfonylureas Failed - 07/30/2022  9:50 PM      Failed - HBA1C is between 0 and 7.9 and within 180 days    Hemoglobin A1C  Date Value Ref Range Status  05/05/2022 8.3 (A) 4.0 - 5.6 % Final   Hgb A1c MFr Bld  Date Value Ref Range Status  06/26/2021 8.0 (H) <5.7 % of total Hgb Final    Comment:    For someone without known diabetes, a hemoglobin A1c value of 6.5% or greater indicates that they may have  diabetes and this should be confirmed with a follow-up  test. . For someone with known diabetes, a value <7% indicates  that their diabetes is well controlled and a value  greater than or equal to 7% indicates suboptimal  control. A1c targets should be individualized based on  duration of diabetes, age, comorbid conditions, and  other considerations. . Currently, no consensus exists regarding use of hemoglobin A1c for diagnosis of diabetes for children. .          Passed - Cr in normal range and within 360 days    Creat  Date Value Ref Range Status  05/05/2022 0.72 0.60 - 1.00 mg/dL Final   Creatinine,U  Date Value Ref Range Status  12/13/2015 153.6 mg/dL Final   Creatinine, Urine  Date Value Ref Range Status  06/26/2021 84 20 - 275 mg/dL Final         Passed - Valid encounter within last 6 months    Recent Outpatient Visits           2 months ago Type 2 diabetes mellitus with hyperglycemia, without long-term current use of insulin [facility])   [place] [place], [place] W, NP   10 months ago Type 2 diabetes mellitus with hyperglycemia, without long-term current use of insulin [facility])   [facility] [place], Salvadore Oxford, NP   1 year ago Encounter for general adult medical examination with abnormal  findings     [facility] [place], Salvadore Oxford, NP   1 year ago Hyponatremia   [place] [facility] [place], Salvadore Oxford, NP   2 years ago Urinary urgency   [place] [facility] [place], Salvadore Oxford, NP       Future Appointments             In 4 days [place], Salvadore Oxford, NP [facility]             atorvastatin (LIPITOR) 20 MG tablet [Pharmacy Med Name: ATORVASTATIN 20 MG TABLET] 90 tablet 0    Sig: TAKE 1 TABLET BY MOUTH EVERY DAY     Cardiovascular:  Antilipid - Statins Failed - 07/30/2022  9:50 PM      Failed - Lipid Panel in normal range within the last 12 months    Cholesterol  Date Value Ref Range Status  05/05/2022 135 <200 mg/dL Final   LDL Cholesterol (Calc)  Date Value Ref Range Status  05/05/2022 65 mg/dL (calc) Final    Comment:    Reference range: <100 . Desirable range <100 mg/dL for primary prevention;   <70 mg/dL for patients with CHD or diabetic patients  with > or = 2 CHD risk factors. Marland Kitchen LDL-C is now calculated using the Martin-Hopkins  calculation, which is a validated novel method providing  better accuracy than the Friedewald equation in the  estimation of LDL-C.  Horald Pollen et al. Lenox Ahr. 1610;960(45): 2061-2068  (http://education.[facility].com/faq/FAQ164)    Direct LDL  Date Value Ref Range Status  12/13/2015 115.0 mg/dL Final    Comment:    Optimal:  <100 mg/dLNear or Above Optimal:  100-129 mg/dLBorderline High:  130-159 mg/dLHigh:  160-189 mg/dLVery High:  >190 mg/dL   HDL  Date Value Ref Range Status  05/05/2022 39 (L) > OR = 50 mg/dL Final   Triglycerides  Date Value Ref Range Status  05/05/2022 247 (H) <150 mg/dL Final    Comment:    . If a non-fasting specimen was collected, consider repeat triglyceride testing on a fasting specimen if clinically indicated.  Perry Mount et al. J. of Clin. Lipidol. 2015;9:129-169. Marland Kitchen          Passed - Patient  is not pregnant      Passed - Valid encounter within last 12 months    Recent Outpatient Visits           2 months ago Type 2 diabetes mellitus with hyperglycemia, without long-term current use of insulin [facility])   [facility] [place], [place] W, NP   10 months ago Type 2 diabetes mellitus with hyperglycemia, without long-term current use of insulin [facility])   [facility] [place], Salvadore Oxford, NP   1 year ago Encounter for general adult medical examination with abnormal findings   [employer] [facility] [place], Salvadore Oxford, NP   1 year ago Hyponatremia   [place] [facility] [place], Salvadore Oxford, NP   2 years ago Urinary urgency   [facility] [place], Salvadore Oxford, NP       Future Appointments             In 4 days [place], Salvadore Oxford, NP [place] [facility], [facility]

## 2022-08-04 ENCOUNTER — Ambulatory Visit (INDEPENDENT_AMBULATORY_CARE_PROVIDER_SITE_OTHER): Payer: Medicare HMO | Admitting: Internal Medicine

## 2022-08-04 ENCOUNTER — Encounter: Payer: Self-pay | Admitting: Internal Medicine

## 2022-08-04 VITALS — BP 126/68 | HR 64 | Temp 96.6°F | Ht 65.0 in | Wt 167.0 lb

## 2022-08-04 DIAGNOSIS — Z6827 Body mass index (BMI) 27.0-27.9, adult: Secondary | ICD-10-CM

## 2022-08-04 DIAGNOSIS — Z1211 Encounter for screening for malignant neoplasm of colon: Secondary | ICD-10-CM | POA: Diagnosis not present

## 2022-08-04 DIAGNOSIS — Z1231 Encounter for screening mammogram for malignant neoplasm of breast: Secondary | ICD-10-CM

## 2022-08-04 DIAGNOSIS — Z0001 Encounter for general adult medical examination with abnormal findings: Secondary | ICD-10-CM | POA: Diagnosis not present

## 2022-08-04 DIAGNOSIS — E663 Overweight: Secondary | ICD-10-CM | POA: Diagnosis not present

## 2022-08-04 DIAGNOSIS — E1165 Type 2 diabetes mellitus with hyperglycemia: Secondary | ICD-10-CM

## 2022-08-04 NOTE — Progress Notes (Signed)
Subjective:    Patient ID: Maria Sloan, female    DOB: December 31, 1950, 72 y.o.   MRN: [id number]  HPI  Patient presents to clinic today for her annual exam.  Flu: 12/2021 Tetanus: 08/2019 COVID: Pfizer x 3 Pneumovax: 08/2019 Prevnar: 12/2017 Shingrix: Never Pap smear: Hysterectomy Mammogram: 08/2021 Bone density: 08/2021 Colon screening: 04/2019, Cologuard Vision screening: annually Dentist: as needed  Diet: She does eat meat. She consumes fruits and veggies. She tries to avoid fried foods. She drinks mostly Dt. Dr. Reino Kent, tea, water and coffee. Exercise: Walking  Review of Systems  Past Medical History:  Diagnosis Date   Anxiety    Arthritis    Cervical cancer (HCC)    Hyperlipidemia    PVD (peripheral vascular disease) (HCC)    R leg venous insufficiency   PVD (peripheral vascular disease) (HCC)    Shingles    Type 2 diabetes mellitus (HCC)     Current Outpatient Medications  Medication Sig Dispense Refill   Accu-Chek Softclix Lancets lancets ONE TIME DAILY AS DIRECTED. FOR ICD-10 E10.9, E11.9 A1C 02/11/2018- 11.8% 100 each 12   atorvastatin (LIPITOR) 20 MG tablet TAKE 1 TABLET BY MOUTH EVERY DAY 90 tablet 0   Blood Glucose Monitoring Suppl (ACCU-CHEK AVIVA PLUS) w/Device KIT 1 Device by Does not apply route as directed. . One time daily as directed. FOR ICD-10 E10.9, E11.9 A1C 02/11/2018- 11.8% 1 kit 0   busPIRone (BUSPAR) 5 MG tablet TAKE 1 TABLET BY MOUTH EVERYDAY AT BEDTIME 90 tablet 1   CALCIUM CITRATE PO Take 1 tablet by mouth daily.      Cholecalciferol (VITAMIN D PO) Take 1 tablet by mouth daily.      citalopram (CELEXA) 20 MG tablet TAKE 1 TABLET BY MOUTH EVERY DAY 90 tablet 2   ezetimibe (ZETIA) 10 MG tablet TAKE 1 TABLET BY MOUTH EVERY DAY 90 tablet 0   fluticasone (FLONASE) 50 MCG/ACT nasal spray PLACE 2 SPRAYS INTO BOTH NOSTRILS DAILY. USE FOR 4-6 WEEKS THEN STOP AND USE AS NEEDED 48 mL 0   glimepiride (AMARYL) 2 MG tablet TAKE 1 TABLET BY MOUTH EVERY DAY  WITH BREAKFAST 90 tablet 1   glucose blood (ACCU-CHEK AVIVA PLUS) test strip USE UP TO ONE TIME AS DIRECTED 100 strip 12   lisinopril (ZESTRIL) 5 MG tablet TAKE 1 TABLET (5 MG TOTAL) BY MOUTH DAILY. 90 tablet 1   metFORMIN (GLUCOPHAGE) 1000 MG tablet TAKE 1 TABLET (1,000 MG TOTAL) BY MOUTH TWICE A DAY WITH FOOD 180 tablet 0   Multiple Vitamin (MULTIVITAMIN) tablet Take 1 tablet by mouth daily.     Omega-3 Fatty Acids (FISH OIL) 1000 MG CAPS Take 1,000 mg by mouth 3 (three) times daily.     OZEMPIC, 0.25 OR 0.5 MG/DOSE, 2 MG/3ML SOPN Inject 0.5 mg into the skin once a week. Start with 0.25mg  weekly x 4 weeks then increase to 0.5mg  weekly injection. 9 mL 1   Potassium 75 MG TABS Take 1 tablet by mouth daily.     No current facility-administered medications for this visit.    Allergies  Allergen Reactions   Prochlorperazine Other (See Comments)    Compazine   Other     Other reaction(s): Dizziness   Actifed Cold-Allergy [Chlorpheniramine-Phenylephrine]     Ok with Robitussin, Mucinex   Penicillins     Family History  Problem Relation Age of Onset   Stroke Mother    Emphysema Father    Lung cancer Father    Hyperlipidemia  Father    Hyperlipidemia Sister    Healthy Son    Colon cancer Neg Hx    Breast cancer Neg Hx    Ovarian cancer Neg Hx     Social History   Socioeconomic History   Marital status: Divorced    Spouse name: Not on file   Number of children: Not on file   Years of education: Not on file   Highest education level: Bachelor's degree (e.g., BA, AB, BS)  Occupational History   Occupation: retired   Tobacco Use   Smoking status: Former    Packs/day: 1.00    Years: 25.00    Additional pack years: 0.00    Total pack years: 25.00    Types: Cigarettes    Quit date: 11/29/1994    Years since quitting: 27.6   Smokeless tobacco: Never  Vaping Use   Vaping Use: Never used  Substance and Sexual Activity   Alcohol use: Yes    Alcohol/week: 0.0 standard drinks of  alcohol    Comment: occasionally   Drug use: Never   Sexual activity: Not Currently    Birth control/protection: None  Other Topics Concern   Not on file  Social History Narrative   Divorced.   1 son.    Moved from [place].   Enjoys making jewelry, spending time with family, crafts, reading.   Social Determinants of Health   Financial Resource Strain: Low Risk  (07/31/2022)   Overall Financial Resource Strain (CARDIA)    Difficulty of Paying Living Expenses: Not very hard  Food Insecurity: No Food Insecurity (07/31/2022)   Hunger Vital Sign    Worried About Running Out of Food in the Last Year: Never true    Ran Out of Food in the Last Year: Never true  Transportation Needs: No Transportation Needs (07/31/2022)   PRAPARE - Administrator, Civil Service (Medical): No    Lack of Transportation (Non-Medical): No  Physical Activity: Sufficiently Active (07/31/2022)   Exercise Vital Sign    Days of Exercise per Week: 4 days    Minutes of Exercise per Session: 50 min  Stress: Stress Concern Present (07/31/2022)   [employer] of Occupational Health - Occupational Stress Questionnaire    Feeling of Stress : To some extent  Social Connections: Socially Isolated (07/31/2022)   Social Connection and Isolation Panel [NHANES]    Frequency of Communication with Friends and Family: More than three times a week    Frequency of Social Gatherings with Friends and Family: Never    Attends Religious Services: Never    Database administrator or Organizations: No    Attends Engineer, structural: Not on file    Marital Status: Divorced  Intimate Partner Violence: Not At Risk (06/05/2021)   Humiliation, Afraid, Rape, and Kick questionnaire    Fear of Current or Ex-Partner: No    Emotionally Abused: No    Physically Abused: No    Sexually Abused: No     Constitutional: Denies fever, malaise, fatigue, headache or abrupt weight changes.  HEENT: Denies eye pain, eye redness,  ear pain, ringing in the ears, wax buildup, runny nose, nasal congestion, bloody nose, or sore throat. Respiratory: Denies difficulty breathing, shortness of breath, cough or sputum production.   Cardiovascular: Denies chest pain, chest tightness, palpitations or swelling in the hands or feet.  Gastrointestinal: Pt reports intermittent nausea. Denies abdominal pain, bloating, constipation, diarrhea or blood in the stool.  GU: Denies urgency, frequency, pain  with urination, burning sensation, blood in urine, odor or discharge. Musculoskeletal: Patient reports knee pain.  Denies decrease in range of motion, difficulty with gait, muscle pain or joint swelling.  Skin: Denies redness, rashes, lesions or ulcercations.  Neurological: Patient reports intermittent dizziness.  Denies difficulty with memory, difficulty with speech or problems with balance and coordination.  Psych: Patient has a history of anxiety and depression.  Denies SI/HI.  No other specific complaints in a complete review of systems (except as listed in HPI above).     Objective:   Physical Exam BP 126/68 (BP Location: Left Arm, Patient Position: Sitting, Cuff Size: Normal)   Pulse 64   Temp (!) 96.6 F (35.9 C) (Temporal)   Ht 5\' 5"  (1.651 m)   Wt 167 lb (75.8 kg)   BMI 27.79 kg/m   Wt Readings from Last 3 Encounters:  05/05/22 173 lb (78.5 kg)  02/28/22 180 lb (81.6 kg)  10/01/21 183 lb (83 kg)    General: Appears her stated age, overweight, in NAD. Skin: Warm, dry and intact. No ulcerations noted. HEENT: Head: normal shape and size; Eyes: sclera white, no icterus, conjunctiva pink, PERRLA and EOMs intact;  Neck:  Neck supple, trachea midline. No masses, lumps or thyromegaly present.  Cardiovascular: Normal rate and rhythm. S1,S2 noted.  No murmur, rubs or gallops noted. No JVD or BLE edema. No carotid bruits noted. Pulmonary/Chest: Normal effort and positive vesicular breath sounds. No respiratory distress. No  wheezes, rales or ronchi noted.  Abdomen: Normal bowel sounds.  Musculoskeletal: Strength 5/5 BUE/BLE. No difficulty with gait.  Neurological: Alert and oriented. Cranial nerves II-XII grossly intact. Coordination normal.  Psychiatric: Mood and affect normal. Behavior is normal. Judgment and thought content normal.     BMET    Component Value Date/Time   NA 142 05/05/2022 0852   K 4.4 05/05/2022 0852   CL 105 05/05/2022 0852   CO2 26 05/05/2022 0852   GLUCOSE 225 (H) 05/05/2022 0852   BUN 19 05/05/2022 0852   CREATININE 0.72 05/05/2022 0852   CALCIUM 9.7 05/05/2022 0852   GFRNONAA >60 03/06/2021 0933   GFRNONAA 94 02/11/2018 0928   GFRAA 109 02/11/2018 0928    Lipid Panel     Component Value Date/Time   CHOL 135 05/05/2022 0852   TRIG 247 (H) 05/05/2022 0852   HDL 39 (L) 05/05/2022 0852   CHOLHDL 3.5 05/05/2022 0852   LDLCALC 65 05/05/2022 0852    CBC    Component Value Date/Time   WBC 7.0 05/05/2022 0852   RBC 4.76 05/05/2022 0852   HGB 14.3 05/05/2022 0852   HCT 42.9 05/05/2022 0852   PLT 274 05/05/2022 0852   MCV 90.1 05/05/2022 0852   MCH 30.0 05/05/2022 0852   MCHC 33.3 05/05/2022 0852   RDW 12.8 05/05/2022 0852   LYMPHSABS 1.3 03/06/2021 0933   MONOABS 1.0 03/06/2021 0933   EOSABS 0.0 03/06/2021 0933   BASOSABS 0.0 03/06/2021 0933    Hgb A1C Lab Results  Component Value Date   HGBA1C 8.3 (A) 05/05/2022            Assessment & Plan:   Preventative Health Maintenance:  Encouraged her to get a flu shot in the fall Tetanus UTD Pneumovax and Prevnar UTD Encouraged her to get her COVID booster Discussed Shingrix vaccine, she will check coverage with her insurance company and schedule a visit if she would like to have this done She no longer needs to screen for cervical cancer  Mammogram ordered-she will call to schedule Bone density UTD Cologuard ordered Encouraged her to consume a balanced diet and exercise regimen Advised her to see an  eye doctor and dentist annually We will check CBC, c-Met, lipid, A1c and urine microalbumin today  RTC in 36 months, follow-up chronic conditions Nicki Reaper, NP

## 2022-08-04 NOTE — Patient Instructions (Signed)
Health Maintenance for Postmenopausal Women Menopause is a normal process in which your ability to get pregnant comes to an end. This process happens slowly over many months or years, usually between the ages of 48 and 55. Menopause is complete when you have missed your menstrual period for 12 months. It is important to talk with your health care provider about some of the most common conditions that affect women after menopause (postmenopausal women). These include heart disease, cancer, and bone loss (osteoporosis). Adopting a healthy lifestyle and getting preventive care can help to promote your health and wellness. The actions you take can also lower your chances of developing some of these common conditions. What are the signs and symptoms of menopause? During menopause, you may have the following symptoms: Hot flashes. These can be moderate or severe. Night sweats. Decrease in sex drive. Mood swings. Headaches. Tiredness (fatigue). Irritability. Memory problems. Problems falling asleep or staying asleep. Talk with your health care provider about treatment options for your symptoms. Do I need hormone replacement therapy? Hormone replacement therapy is effective in treating symptoms that are caused by menopause, such as hot flashes and night sweats. Hormone replacement carries certain risks, especially as you become older. If you are thinking about using estrogen or estrogen with progestin, discuss the benefits and risks with your health care provider. How can I reduce my risk for heart disease and stroke? The risk of heart disease, heart attack, and stroke increases as you age. One of the causes may be a change in the body's hormones during menopause. This can affect how your body uses dietary fats, triglycerides, and cholesterol. Heart attack and stroke are medical emergencies. There are many things that you can do to help prevent heart disease and stroke. Watch your blood pressure High  blood pressure causes heart disease and increases the risk of stroke. This is more likely to develop in people who have high blood pressure readings or are overweight. Have your blood pressure checked: Every 3-5 years if you are 18-39 years of age. Every year if you are 40 years old or older. Eat a healthy diet  Eat a diet that includes plenty of vegetables, fruits, low-fat dairy products, and lean protein. Do not eat a lot of foods that are high in solid fats, added sugars, or sodium. Get regular exercise Get regular exercise. This is one of the most important things you can do for your health. Most adults should: Try to exercise for at least 150 minutes each week. The exercise should increase your heart rate and make you sweat (moderate-intensity exercise). Try to do strengthening exercises at least twice each week. Do these in addition to the moderate-intensity exercise. Spend less time sitting. Even light physical activity can be beneficial. Other tips Work with your health care provider to achieve or maintain a healthy weight. Do not use any products that contain nicotine or tobacco. These products include cigarettes, chewing tobacco, and vaping devices, such as e-cigarettes. If you need help quitting, ask your health care provider. Know your numbers. Ask your health care provider to check your cholesterol and your blood sugar (glucose). Continue to have your blood tested as directed by your health care provider. Do I need screening for cancer? Depending on your health history and family history, you may need to have cancer screenings at different stages of your life. This may include screening for: Breast cancer. Cervical cancer. Lung cancer. Colorectal cancer. What is my risk for osteoporosis? After menopause, you may be   at increased risk for osteoporosis. Osteoporosis is a condition in which bone destruction happens more quickly than new bone creation. To help prevent osteoporosis or  the bone fractures that can happen because of osteoporosis, you may take the following actions: If you are 19-50 years old, get at least 1,000 mg of calcium and at least 600 international units (IU) of vitamin D per day. If you are older than age 50 but younger than age 70, get at least 1,200 mg of calcium and at least 600 international units (IU) of vitamin D per day. If you are older than age 70, get at least 1,200 mg of calcium and at least 800 international units (IU) of vitamin D per day. Smoking and drinking excessive alcohol increase the risk of osteoporosis. Eat foods that are rich in calcium and vitamin D, and do weight-bearing exercises several times each week as directed by your health care provider. How does menopause affect my mental health? Depression may occur at any age, but it is more common as you become older. Common symptoms of depression include: Feeling depressed. Changes in sleep patterns. Changes in appetite or eating patterns. Feeling an overall lack of motivation or enjoyment of activities that you previously enjoyed. Frequent crying spells. Talk with your health care provider if you think that you are experiencing any of these symptoms. General instructions See your health care provider for regular wellness exams and vaccines. This may include: Scheduling regular health, dental, and eye exams. Getting and maintaining your vaccines. These include: Influenza vaccine. Get this vaccine each year before the flu season begins. Pneumonia vaccine. Shingles vaccine. Tetanus, diphtheria, and pertussis (Tdap) booster vaccine. Your health care provider may also recommend other immunizations. Tell your health care provider if you have ever been abused or do not feel safe at home. Summary Menopause is a normal process in which your ability to get pregnant comes to an end. This condition causes hot flashes, night sweats, decreased interest in sex, mood swings, headaches, or lack  of sleep. Treatment for this condition may include hormone replacement therapy. Take actions to keep yourself healthy, including exercising regularly, eating a healthy diet, watching your weight, and checking your blood pressure and blood sugar levels. Get screened for cancer and depression. Make sure that you are up to date with all your vaccines. This information is not intended to replace advice given to you by your health care provider. Make sure you discuss any questions you have with your health care provider. Document Revised: 07/08/2020 Document Reviewed: 07/08/2020 [employer] Patient Education  2024 [employer]  

## 2022-08-04 NOTE — Assessment & Plan Note (Signed)
Encourage diet and exercise for weight loss 

## 2022-08-05 LAB — CBC
HCT: 40.4 % (ref 35.0–45.0)
Hemoglobin: 13.5 g/dL (ref 11.7–15.5)
MCH: 30.1 pg (ref 27.0–33.0)
MCHC: 33.4 g/dL (ref 32.0–36.0)
MCV: 90 fL (ref 80.0–100.0)
MPV: 11 fL (ref 7.5–12.5)
Platelets: 247 10*3/uL (ref 140–400)
RBC: 4.49 10*6/uL (ref 3.80–5.10)
RDW: 12.6 % (ref 11.0–15.0)
WBC: 7.8 10*3/uL (ref 3.8–10.8)

## 2022-08-05 LAB — LIPID PANEL
Cholesterol: 101 mg/dL (ref <200)
HDL: 37 mg/dL — ABNORMAL LOW (ref >=50)
LDL Cholesterol (Calc): 41 mg/dL (calc)
Non-HDL Cholesterol (Calc): 64 mg/dL (calc) (ref <130)
Total CHOL/HDL Ratio: 2.7 (calc) (ref <5.0)
Triglycerides: 144 mg/dL (ref <150)

## 2022-08-05 LAB — COMPLETE METABOLIC PANEL WITH GFR
AG Ratio: 1.8 (calc) (ref 1.0–2.5)
ALT: 26 U/L (ref 6–29)
AST: 20 U/L (ref 10–35)
Albumin: 4.3 g/dL (ref 3.6–5.1)
Alkaline phosphatase (APISO): 64 U/L (ref 37–153)
BUN/Creatinine Ratio: 24 (calc) — ABNORMAL HIGH (ref 6–22)
BUN: 14 mg/dL (ref 7–25)
CO2: 27 mmol/L (ref 20–32)
Calcium: 9.6 mg/dL (ref 8.6–10.4)
Chloride: 107 mmol/L (ref 98–110)
Creat: 0.59 mg/dL — ABNORMAL LOW (ref 0.60–1.00)
Globulin: 2.4 g/dL (calc) (ref 1.9–3.7)
Glucose, Bld: 124 mg/dL — ABNORMAL HIGH (ref 65–99)
Potassium: 4.6 mmol/L (ref 3.5–5.3)
Sodium: 143 mmol/L (ref 135–146)
Total Bilirubin: 0.5 mg/dL (ref 0.2–1.2)
Total Protein: 6.7 g/dL (ref 6.1–8.1)
eGFR: 96 mL/min/{1.73_m2} (ref >=60)

## 2022-08-05 LAB — HEMOGLOBIN A1C
Hgb A1c MFr Bld: 6.4 % of total Hgb — ABNORMAL HIGH (ref <5.7)
Mean Plasma Glucose: 137 mg/dL
eAG (mmol/L): 7.6 mmol/L

## 2022-08-05 LAB — MICROALBUMIN / CREATININE URINE RATIO
Creatinine, Urine: 104 mg/dL (ref 20–275)
Microalb Creat Ratio: 6 mg/g creat (ref <30)
Microalb, Ur: 0.6 mg/dL

## 2022-08-07 ENCOUNTER — Ambulatory Visit (INDEPENDENT_AMBULATORY_CARE_PROVIDER_SITE_OTHER): Payer: Medicare HMO

## 2022-08-07 VITALS — BP 100/50 | Ht 63.0 in | Wt 168.6 lb

## 2022-08-07 DIAGNOSIS — Z0000 Encounter for general adult medical examination without abnormal findings: Secondary | ICD-10-CM

## 2022-08-07 NOTE — Progress Notes (Signed)
Subjective:   Maria Sloan is a 72 y.o. female who presents for Medicare Annual (Subsequent) preventive examination.  Review of Systems     Cardiac Risk Factors include: advanced age (>10men, >61 women);diabetes mellitus;dyslipidemia     Objective:    Today's Vitals   08/07/22 0913  BP: (!) 100/50  Weight: 168 lb 9.6 oz (76.5 kg)  Height: 5\' 3"  (1.6 m)   Body mass index is 29.87 kg/m.     08/07/2022    9:22 AM 02/28/2022   10:12 AM 06/05/2021   10:23 AM 03/06/2021    9:22 AM 05/28/2020    9:42 AM 05/23/2019    9:22 AM 04/18/2018   10:18 AM  Advanced Directives  Does Patient Have a Medical Advance Directive? No No No No No No   Would patient like information on creating a medical advance directive? No - Patient declined  No - Patient declined No - Patient declined   No - Patient declined    Current Medications (verified) Outpatient Encounter Medications as of 08/07/2022  Medication Sig   Accu-Chek Softclix Lancets lancets ONE TIME DAILY AS DIRECTED. FOR ICD-10 E10.9, E11.9 A1C 02/11/2018- 11.8%   atorvastatin (LIPITOR) 20 MG tablet TAKE 1 TABLET BY MOUTH EVERY DAY   Blood Glucose Monitoring Suppl (ACCU-CHEK AVIVA PLUS) w/Device KIT 1 Device by Does not apply route as directed. . One time daily as directed. FOR ICD-10 E10.9, E11.9 A1C 02/11/2018- 11.8%   busPIRone (BUSPAR) 5 MG tablet TAKE 1 TABLET BY MOUTH EVERYDAY AT BEDTIME   CALCIUM CITRATE PO Take 1 tablet by mouth daily.    Cholecalciferol (VITAMIN D PO) Take 1 tablet by mouth daily.    citalopram (CELEXA) 20 MG tablet TAKE 1 TABLET BY MOUTH EVERY DAY   ezetimibe (ZETIA) 10 MG tablet TAKE 1 TABLET BY MOUTH EVERY DAY   fluticasone (FLONASE) 50 MCG/ACT nasal spray PLACE 2 SPRAYS INTO BOTH NOSTRILS DAILY. USE FOR 4-6 WEEKS THEN STOP AND USE AS NEEDED   glimepiride (AMARYL) 2 MG tablet TAKE 1 TABLET BY MOUTH EVERY DAY WITH BREAKFAST   glucose blood (ACCU-CHEK AVIVA PLUS) test strip USE UP TO ONE TIME AS DIRECTED   lisinopril  (ZESTRIL) 5 MG tablet TAKE 1 TABLET (5 MG TOTAL) BY MOUTH DAILY.   metFORMIN (GLUCOPHAGE) 1000 MG tablet TAKE 1 TABLET (1,000 MG TOTAL) BY MOUTH TWICE A DAY WITH FOOD   Multiple Vitamin (MULTIVITAMIN) tablet Take 1 tablet by mouth daily.   Omega-3 Fatty Acids (FISH OIL) 1000 MG CAPS Take 1,000 mg by mouth 3 (three) times daily.   OZEMPIC, 0.25 OR 0.5 MG/DOSE, 2 MG/3ML SOPN Inject 0.5 mg into the skin once a week. Start with 0.25mg  weekly x 4 weeks then increase to 0.5mg  weekly injection.   Potassium 75 MG TABS Take 1 tablet by mouth daily.   No facility-administered encounter medications on file as of 08/07/2022.    Allergies (verified) Prochlorperazine, Other, Triprolidine-pseudoephedrine, Actifed cold-allergy [chlorpheniramine-phenylephrine], and Penicillins   History: Past Medical History:  Diagnosis Date   Anxiety    Arthritis    Cervical cancer (HCC)    Hyperlipidemia    PVD (peripheral vascular disease) (HCC)    R leg venous insufficiency   PVD (peripheral vascular disease) (HCC)    Shingles    Type 2 diabetes mellitus (HCC)    Past Surgical History:  Procedure Laterality Date   PARTIAL HYSTERECTOMY     ovaries remain   ROTATOR CUFF REPAIR Right    Family History  Problem Relation Age of Onset   Stroke Mother    Emphysema Father    Lung cancer Father    Hyperlipidemia Father    Hyperlipidemia Sister    Healthy Son    Colon cancer Neg Hx    Breast cancer Neg Hx    Ovarian cancer Neg Hx    Social History   Socioeconomic History   Marital status: Divorced    Spouse name: Not on file   Number of children: Not on file   Years of education: Not on file   Highest education level: Bachelor's degree (e.g., BA, AB, BS)  Occupational History   Occupation: retired   Tobacco Use   Smoking status: Former    Packs/day: 1.00    Years: 25.00    Additional pack years: 0.00    Total pack years: 25.00    Types: Cigarettes    Quit date: 11/29/1994    Years since quitting:  27.7   Smokeless tobacco: Never  Vaping Use   Vaping Use: Never used  Substance and Sexual Activity   Alcohol use: Yes    Alcohol/week: 0.0 standard drinks of alcohol    Comment: occasionally   Drug use: Never   Sexual activity: Not Currently    Birth control/protection: None  Other Topics Concern   Not on file  Social History Narrative   Divorced.   1 son.    Moved from [place].   Enjoys making jewelry, spending time with family, crafts, reading.   Social Determinants of Health   Financial Resource Strain: Low Risk  (08/07/2022)   Overall Financial Resource Strain (CARDIA)    Difficulty of Paying Living Expenses: Not very hard  Food Insecurity: No Food Insecurity (08/07/2022)   Hunger Vital Sign    Worried About Running Out of Food in the Last Year: Never true    Ran Out of Food in the Last Year: Never true  Transportation Needs: No Transportation Needs (08/07/2022)   PRAPARE - Administrator, Civil Service (Medical): No    Lack of Transportation (Non-Medical): No  Physical Activity: Sufficiently Active (08/07/2022)   Exercise Vital Sign    Days of Exercise per Week: 4 days    Minutes of Exercise per Session: 50 min  Stress: No Stress Concern Present (08/07/2022)   [employer] of Occupational Health - Occupational Stress Questionnaire    Feeling of Stress : Only a little  Recent Concern: Stress - Stress Concern Present (07/31/2022)   [employer] of Occupational Health - Occupational Stress Questionnaire    Feeling of Stress : To some extent  Social Connections: Socially Isolated (08/07/2022)   Social Connection and Isolation Panel [NHANES]    Frequency of Communication with Friends and Family: More than three times a week    Frequency of Social Gatherings with Friends and Family: Once a week    Attends Religious Services: Never    Database administrator or Organizations: No    Attends Engineer, structural: Never    Marital Status: Divorced     Tobacco Counseling Counseling given: Not Answered   Clinical Intake:  Pre-visit preparation completed: Yes  Pain : No/denies pain     Nutritional Risks: None Diabetes: Yes CBG done?: No Did pt. bring in CBG monitor from home?: No  How often do you need to have someone help you when you read instructions, pamphlets, or other written materials from your doctor or pharmacy?: 1 - Never  Diabetic?yes Nutrition Risk Assessment:  Has the patient had any N/V/D within the last 2 months?  Yes  Does the patient have any non-healing wounds?  No  Has the patient had any unintentional weight loss or weight gain?  No   Diabetes:  Is the patient diabetic?  Yes  If diabetic, was a CBG obtained today?  No  Did the patient bring in their glucometer from home?  No  How often do you monitor your CBG's? Every other day.   Financial Strains and Diabetes Management:  Are you having any financial strains with the device, your supplies or your medication? No .  Does the patient want to be seen by Chronic Care Management for management of their diabetes?  No  Would the patient like to be referred to a Nutritionist or for Diabetic Management?  No   Diabetic Exams:  Diabetic Eye Exam: Completed 07/17/21. Overdue for diabetic eye exam. Pt has been advised about the importance in completing this exam.   Diabetic Foot Exam: Completed 08/04/22. Pt has been advised about the importance in completing this exam.     Interpreter Needed?: No  Information entered by :: Kennedy Bucker, LPN   Activities of Daily Living    08/07/2022    9:23 AM 08/04/2022   10:38 AM  In your present state of health, do you have any difficulty performing the following activities:  Hearing? 1 0  Vision? 0 0  Difficulty concentrating or making decisions? 0 0  Walking or climbing stairs? 0 0  Comment arthritic knee makes me go slow   Dressing or bathing? 0 0  Doing errands, shopping? 0 0  Preparing Food and eating ?  N N  Using the Toilet? N N  In the past six months, have you accidently leaked urine? N N  Do you have problems with loss of bowel control? N N  Managing your Medications? N N  Managing your Finances? N N  Housekeeping or managing your Housekeeping? N N    Patient Care Team: Lorre Munroe, NP as PCP - General (Internal Medicine)  Indicate any recent Medical Services you may have received from other than [facility] providers in the past year (date may be approximate).     Assessment:   This is a routine wellness examination for Maria Sloan.  Hearing/Vision screen Hearing Screening - Comments:: Wears aids Vision Screening - Comments:: Wears glasses- Dr.Woodard  Dietary issues and exercise activities discussed: Current Exercise Habits: Home exercise routine, Type of exercise: walking, Time (Minutes): 50, Frequency (Times/Week): 4, Weekly Exercise (Minutes/Week): 200, Intensity: Mild   Goals Addressed             This Visit's Progress    DIET - EAT MORE FRUITS AND VEGETABLES         Depression Screen    08/07/2022    9:20 AM 08/04/2022    9:28 AM 05/05/2022    8:49 AM 10/01/2021    8:40 AM 06/26/2021    8:27 AM 06/05/2021   10:18 AM 03/11/2021    8:26 AM  PHQ 2/9 Scores  PHQ - 2 Score 2 1 3  0 1 0 0  PHQ- 9 Score 4  6 2 4  9     Fall Risk    08/07/2022    9:22 AM 08/04/2022   10:38 AM 08/04/2022    9:28 AM 05/05/2022    8:49 AM 10/01/2021    8:40 AM  Fall Risk   Falls in the past year? 1 1 1 1  0  Number falls in past yr: 0 0 0 0 0  Injury with Fall? 0 0 0 0 0  Risk for fall due to : History of fall(s)  Impaired mobility No Fall Risks No Fall Risks  Follow up Falls prevention discussed;Falls evaluation completed    Falls evaluation completed    FALL RISK PREVENTION PERTAINING TO THE HOME:  Any stairs in or around the home? Yes  If so, are there any without handrails? No  Home free of loose throw rugs in walkways, pet beds, electrical cords, etc? Yes  Adequate lighting in your home to  reduce risk of falls? Yes   ASSISTIVE DEVICES UTILIZED TO PREVENT FALLS:  Life alert? No  Use of a cane, walker or w/c? No  Grab bars in the bathroom? No  Shower chair or bench in shower? Yes  Elevated toilet seat or a handicapped toilet? No   TIMED UP AND GO:  Was the test performed? Yes .  Length of time to ambulate 10 feet: 4 sec.   Gait steady and fast without use of assistive device  Cognitive Function:        08/07/2022    9:29 AM 06/05/2021   10:20 AM 05/28/2020    9:45 AM  6CIT Screen  What Year? 0 points 0 points 0 points  What month? 0 points 0 points 0 points  What time? 0 points 0 points 0 points  Count back from 20 0 points 0 points 0 points  Months in reverse 0 points 0 points 0 points  Repeat phrase 0 points 0 points 0 points  Total Score 0 points 0 points 0 points    Immunizations Immunization History  Administered Date(s) Administered   Fluad Quad(high Dose 65+) 01/08/2021   Influenza, High Dose Seasonal PF 12/17/2018   Influenza,inj,Quad PF,6+ Mos 12/13/2015   Influenza-Unspecified 12/10/2014, 01/07/2018, 01/30/2019   PFIZER(Purple Top)SARS-COV-2 Vaccination 05/12/2019, 06/12/2019, 02/17/2020   Pneumococcal Conjugate-13 01/07/2018   Pneumococcal Polysaccharide-23 09/13/2019   Tdap 04/05/2015, 09/13/2019    TDAP status: Up to date  Flu Vaccine status: Declined, Education has been provided regarding the importance of this vaccine but patient still declined. Advised may receive this vaccine at local pharmacy or Health Dept. Aware to provide a copy of the vaccination record if obtained from local pharmacy or Health Dept. Verbalized acceptance and understanding.  Pneumococcal vaccine status: Up to date  Covid-19 vaccine status: Completed vaccines  Qualifies for Shingles Vaccine? Yes   Zostavax completed No   Shingrix Completed?: No.    Education has been provided regarding the importance of this vaccine. Patient has been advised to call insurance  company to determine out of pocket expense if they have not yet received this vaccine. Advised may also receive vaccine at local pharmacy or Health Dept. Verbalized acceptance and understanding.  Screening Tests Health Maintenance  Topic Date Due   Zoster Vaccines- Shingrix (1 of 2) Never done   Fecal DNA (Cologuard)  04/29/2022   OPHTHALMOLOGY EXAM  07/18/2022   INFLUENZA VACCINE  10/01/2022   HEMOGLOBIN A1C  02/03/2023   Diabetic kidney evaluation - eGFR measurement  08/04/2023   Diabetic kidney evaluation - Urine ACR  08/04/2023   FOOT EXAM  08/04/2023   Medicare Annual Wellness (AWV)  08/07/2023   MAMMOGRAM  09/17/2023   DTaP/Tdap/Td (3 - Td or Tdap) 09/12/2029   Pneumonia Vaccine 30+ Years old  Completed   DEXA SCAN  Completed   Hepatitis C Screening  Completed   HPV VACCINES  Aged Out   COVID-19 Vaccine  Discontinued    Health Maintenance  Health Maintenance Due  Topic Date Due   Zoster Vaccines- Shingrix (1 of 2) Never done   Fecal DNA (Cologuard)  04/29/2022   OPHTHALMOLOGY EXAM  07/18/2022    Colorectal cancer screening: Type of screening: Cologuard. Completed 04/30/19. Repeat every 3 years- HAS KIT, WILL DO NEXT WEEK  Mammogram status: Ordered 08/04/22. Pt provided with contact info and advised to call to schedule appt.   Bone Density status: Completed 09/16/21. Results reflect: Bone density results: OSTEOPENIA. Repeat every 5 years.  Lung Cancer Screening: (Low Dose CT Chest recommended if Age 75-80 years, 30 pack-year currently smoking OR have quit w/in 15years.) does not qualify.    Additional Screening:  Hepatitis C Screening: does qualify; Completed 12/13/15  Vision Screening: Recommended annual ophthalmology exams for early detection of glaucoma and other disorders of the eye. Is the patient up to date with their annual eye exam?  Yes  Who is the provider or what is the name of the office in which the patient attends annual eye exams? Dr. Clydene Pugh If pt is  not established with a provider, would they like to be referred to a provider to establish care? No .   Dental Screening: Recommended annual dental exams for proper oral hygiene  Community Resource Referral / Chronic Care Management: CRR required this visit?  No   CCM required this visit?  No      Plan:     I have personally reviewed and noted the following in the patient's chart:   Medical and social history Use of alcohol, tobacco or illicit drugs  Current medications and supplements including opioid prescriptions. Patient is not currently taking opioid prescriptions. Functional ability and status Nutritional status Physical activity Advanced directives List of other physicians Hospitalizations, surgeries, and ER visits in previous 12 months Vitals Screenings to include cognitive, depression, and falls Referrals and appointments  In addition, I have reviewed and discussed with patient certain preventive protocols, quality metrics, and best practice recommendations. A written personalized care plan for preventive services as well as general preventive health recommendations were provided to patient.     Hal Hope, LPN   03/07/1094   Nurse Notes: none

## 2022-08-07 NOTE — Patient Instructions (Signed)
Maria Sloan , Thank you for taking time to come for your Medicare Wellness Visit. I appreciate your ongoing commitment to your health goals. Please review the following plan we discussed and let me know if I can assist you in the future.   These are the goals we discussed:  Goals      DIET - EAT MORE FRUITS AND VEGETABLES     Have 3 meals a day     Patient Stated     05/28/2020, wants to weigh 155 pounds        This is a list of the screening recommended for you and due dates:  Health Maintenance  Topic Date Due   Zoster (Shingles) Vaccine (1 of 2) Never done   Cologuard (Stool DNA test)  04/29/2022   Eye exam for diabetics  07/18/2022   Flu Shot  10/01/2022   Hemoglobin A1C  02/03/2023   Yearly kidney function blood test for diabetes  08/04/2023   Yearly kidney health urinalysis for diabetes  08/04/2023   Complete foot exam   08/04/2023   Medicare Annual Wellness Visit  08/07/2023   Mammogram  09/17/2023   DTaP/Tdap/Td vaccine (3 - Td or Tdap) 09/12/2029   Pneumonia Vaccine  Completed   DEXA scan (bone density measurement)  Completed   Hepatitis C Screening  Completed   HPV Vaccine  Aged Out   COVID-19 Vaccine  Discontinued    Advanced directives: no  Conditions/risks identified: none  Next appointment: Follow up in one year for your annual wellness visit 08/13/23 @ 8:45 am in person   Preventive Care 65 Years and Older, Female Preventive care refers to lifestyle choices and visits with your health care provider that can promote health and wellness. What does preventive care include? A yearly physical exam. This is also called an annual well check. Dental exams once or twice a year. Routine eye exams. Ask your health care provider how often you should have your eyes checked. Personal lifestyle choices, including: Daily care of your teeth and gums. Regular physical activity. Eating a healthy diet. Avoiding tobacco and drug use. Limiting alcohol use. Practicing safe  sex. Taking low-dose aspirin every day. Taking vitamin and mineral supplements as recommended by your health care provider. What happens during an annual well check? The services and screenings done by your health care provider during your annual well check will depend on your age, overall health, lifestyle risk factors, and family history of disease. Counseling  Your health care provider may ask you questions about your: Alcohol use. Tobacco use. Drug use. Emotional well-being. Home and relationship well-being. Sexual activity. Eating habits. History of falls. Memory and ability to understand (cognition). Work and work Astronomer. Reproductive health. Screening  You may have the following tests or measurements: Height, weight, and BMI. Blood pressure. Lipid and cholesterol levels. These may be checked every 5 years, or more frequently if you are over 77 years old. Skin check. Lung cancer screening. You may have this screening every year starting at age 17 if you have a 30-pack-year history of smoking and currently smoke or have quit within the past 15 years. Fecal occult blood test (FOBT) of the stool. You may have this test every year starting at age 61. Flexible sigmoidoscopy or colonoscopy. You may have a sigmoidoscopy every 5 years or a colonoscopy every 10 years starting at age 65. Hepatitis C blood test. Hepatitis B blood test. Sexually transmitted disease (STD) testing. Diabetes screening. This is done by checking your  blood sugar (glucose) after you have not eaten for a while (fasting). You may have this done every 1-3 years. Bone density scan. This is done to screen for osteoporosis. You may have this done starting at age 67. Mammogram. This may be done every 1-2 years. Talk to your health care provider about how often you should have regular mammograms. Talk with your health care provider about your test results, treatment options, and if necessary, the need for more  tests. Vaccines  Your health care provider may recommend certain vaccines, such as: Influenza vaccine. This is recommended every year. Tetanus, diphtheria, and acellular pertussis (Tdap, Td) vaccine. You may need a Td booster every 10 years. Zoster vaccine. You may need this after age 65. Pneumococcal 13-valent conjugate (PCV13) vaccine. One dose is recommended after age 86. Pneumococcal polysaccharide (PPSV23) vaccine. One dose is recommended after age 38. Talk to your health care provider about which screenings and vaccines you need and how often you need them. This information is not intended to replace advice given to you by your health care provider. Make sure you discuss any questions you have with your health care provider. Document Released: 03/15/2015 Document Revised: 11/06/2015 Document Reviewed: 12/18/2014 Elsevier Interactive Patient Education  2017 [employer].  Fall Prevention in the Home Falls can cause injuries. They can happen to people of all ages. There are many things you can do to make your home safe and to help prevent falls. What can I do on the outside of my home? Regularly fix the edges of walkways and driveways and fix any cracks. Remove anything that might make you trip as you walk through a door, such as a raised step or threshold. Trim any bushes or trees on the path to your home. Use bright outdoor lighting. Clear any walking paths of anything that might make someone trip, such as rocks or tools. Regularly check to see if handrails are loose or broken. Make sure that both sides of any steps have handrails. Any raised decks and porches should have guardrails on the edges. Have any leaves, snow, or ice cleared regularly. Use sand or salt on walking paths during winter. Clean up any spills in your garage right away. This includes oil or grease spills. What can I do in the bathroom? Use night lights. Install grab bars by the toilet and in the tub and shower.  Do not use towel bars as grab bars. Use non-skid mats or decals in the tub or shower. If you need to sit down in the shower, use a plastic, non-slip stool. Keep the floor dry. Clean up any water that spills on the floor as soon as it happens. Remove soap buildup in the tub or shower regularly. Attach bath mats securely with double-sided non-slip rug tape. Do not have throw rugs and other things on the floor that can make you trip. What can I do in the bedroom? Use night lights. Make sure that you have a light by your bed that is easy to reach. Do not use any sheets or blankets that are too big for your bed. They should not hang down onto the floor. Have a firm chair that has side arms. You can use this for support while you get dressed. Do not have throw rugs and other things on the floor that can make you trip. What can I do in the kitchen? Clean up any spills right away. Avoid walking on wet floors. Keep items that you use a lot in  easy-to-reach places. If you need to reach something above you, use a strong step stool that has a grab bar. Keep electrical cords out of the way. Do not use floor polish or wax that makes floors slippery. If you must use wax, use non-skid floor wax. Do not have throw rugs and other things on the floor that can make you trip. What can I do with my stairs? Do not leave any items on the stairs. Make sure that there are handrails on both sides of the stairs and use them. Fix handrails that are broken or loose. Make sure that handrails are as long as the stairways. Check any carpeting to make sure that it is firmly attached to the stairs. Fix any carpet that is loose or worn. Avoid having throw rugs at the top or bottom of the stairs. If you do have throw rugs, attach them to the floor with carpet tape. Make sure that you have a light switch at the top of the stairs and the bottom of the stairs. If you do not have them, ask someone to add them for you. What else  can I do to help prevent falls? Wear shoes that: Do not have high heels. Have rubber bottoms. Are comfortable and fit you well. Are closed at the toe. Do not wear sandals. If you use a stepladder: Make sure that it is fully opened. Do not climb a closed stepladder. Make sure that both sides of the stepladder are locked into place. Ask someone to hold it for you, if possible. Clearly mark and make sure that you can see: Any grab bars or handrails. First and last steps. Where the edge of each step is. Use tools that help you move around (mobility aids) if they are needed. These include: Canes. Walkers. Scooters. Crutches. Turn on the lights when you go into a dark area. Replace any light bulbs as soon as they burn out. Set up your furniture so you have a clear path. Avoid moving your furniture around. If any of your floors are uneven, fix them. If there are any pets around you, be aware of where they are. Review your medicines with your doctor. Some medicines can make you feel dizzy. This can increase your chance of falling. Ask your doctor what other things that you can do to help prevent falls. This information is not intended to replace advice given to you by your health care provider. Make sure you discuss any questions you have with your health care provider. Document Released: 12/13/2008 Document Revised: 07/25/2015 Document Reviewed: 03/23/2014 Elsevier Interactive Patient Education  2017 [employer].

## 2022-08-17 DIAGNOSIS — Z1211 Encounter for screening for malignant neoplasm of colon: Secondary | ICD-10-CM | POA: Diagnosis not present

## 2022-08-20 ENCOUNTER — Encounter: Payer: Self-pay | Admitting: Internal Medicine

## 2022-08-20 DIAGNOSIS — R195 Other fecal abnormalities: Secondary | ICD-10-CM

## 2022-08-20 LAB — COLOGUARD: COLOGUARD: POSITIVE — AB

## 2022-08-21 ENCOUNTER — Telehealth: Payer: Self-pay

## 2022-08-21 ENCOUNTER — Other Ambulatory Visit: Payer: Self-pay

## 2022-08-21 DIAGNOSIS — R195 Other fecal abnormalities: Secondary | ICD-10-CM

## 2022-08-21 DIAGNOSIS — Z1211 Encounter for screening for malignant neoplasm of colon: Secondary | ICD-10-CM

## 2022-08-21 MED ORDER — NA SULFATE-K SULFATE-MG SULF 17.5-3.13-1.6 GM/177ML PO SOLN: 1.0000 | Freq: Once | ORAL | 0 refills | Status: AC

## 2022-08-21 NOTE — Telephone Encounter (Signed)
Patient left a voicemail because she is calling to schedule her colonoscopy. Please call patient to schedule

## 2022-08-21 NOTE — Telephone Encounter (Signed)
Gastroenterology Pre-Procedure Review  Request Date: 10/12/22 Requesting Physician: Dr. Allegra Lai  PATIENT REVIEW QUESTIONS: The patient responded to the following health history questions as indicated:    1. Are you having any GI issues? Positive cologuard however this is pts 1st colonoscopy yes (Diarrhea associated with diabetic meds every once and while) 2. Do you have a personal history of Polyps? no 3. Do you have a family history of Colon Cancer or Polyps? no 4. Diabetes Mellitus? yes (Pt has been advised to stop Ozempic 7 days prior to colonoscopy, stop metformin 2 days prior to colonoscopy, stop glimeperide 1 day prior to colonoscopy) 5. Joint replacements in the past 12 months?no 6. Major health problems in the past 3 months?no 7. Any artificial heart valves, MVP, or defibrillator?no    MEDICATIONS & ALLERGIES:    Patient reports the following regarding taking any anticoagulation/antiplatelet therapy:   Plavix, Coumadin, Eliquis, Xarelto, Lovenox, Pradaxa, Brilinta, or Effient? no Aspirin? no  Patient confirms/reports the following medications:  Current Outpatient Medications  Medication Sig Dispense Refill   Accu-Chek Softclix Lancets lancets ONE TIME DAILY AS DIRECTED. FOR ICD-10 E10.9, E11.9 A1C 02/11/2018- 11.8% 100 each 12   atorvastatin (LIPITOR) 20 MG tablet TAKE 1 TABLET BY MOUTH EVERY DAY 90 tablet 0   Blood Glucose Monitoring Suppl (ACCU-CHEK AVIVA PLUS) w/Device KIT 1 Device by Does not apply route as directed. . One time daily as directed. FOR ICD-10 E10.9, E11.9 A1C 02/11/2018- 11.8% 1 kit 0   busPIRone (BUSPAR) 5 MG tablet TAKE 1 TABLET BY MOUTH EVERYDAY AT BEDTIME 90 tablet 1   CALCIUM CITRATE PO Take 1 tablet by mouth daily.      Cholecalciferol (VITAMIN D PO) Take 1 tablet by mouth daily.      citalopram (CELEXA) 20 MG tablet TAKE 1 TABLET BY MOUTH EVERY DAY 90 tablet 2   ezetimibe (ZETIA) 10 MG tablet TAKE 1 TABLET BY MOUTH EVERY DAY 90 tablet 0   fluticasone  (FLONASE) 50 MCG/ACT nasal spray PLACE 2 SPRAYS INTO BOTH NOSTRILS DAILY. USE FOR 4-6 WEEKS THEN STOP AND USE AS NEEDED 48 mL 0   glimepiride (AMARYL) 2 MG tablet TAKE 1 TABLET BY MOUTH EVERY DAY WITH BREAKFAST 90 tablet 1   glucose blood (ACCU-CHEK AVIVA PLUS) test strip USE UP TO ONE TIME AS DIRECTED 100 strip 12   lisinopril (ZESTRIL) 5 MG tablet TAKE 1 TABLET (5 MG TOTAL) BY MOUTH DAILY. 90 tablet 1   metFORMIN (GLUCOPHAGE) 1000 MG tablet TAKE 1 TABLET (1,000 MG TOTAL) BY MOUTH TWICE A DAY WITH FOOD 180 tablet 0   Multiple Vitamin (MULTIVITAMIN) tablet Take 1 tablet by mouth daily.     Omega-3 Fatty Acids (FISH OIL) 1000 MG CAPS Take 1,000 mg by mouth 3 (three) times daily.     OZEMPIC, 0.25 OR 0.5 MG/DOSE, 2 MG/3ML SOPN Inject 0.5 mg into the skin once a week. Start with 0.25mg  weekly x 4 weeks then increase to 0.5mg  weekly injection. 9 mL 1   Potassium 75 MG TABS Take 1 tablet by mouth daily.     No current facility-administered medications for this visit.    Patient confirms/reports the following allergies:  Allergies  Allergen Reactions   Prochlorperazine Other (See Comments)    Compazine   Other Hives    Other reaction(s): Dizziness   Triprolidine-Pseudoephedrine Other (See Comments)   Actifed Cold-Allergy [Chlorpheniramine-Phenylephrine]     Ok with Robitussin, Mucinex   Penicillins     No orders of the defined  types were placed in this encounter.   AUTHORIZATION INFORMATION Primary Insurance: 1D#: Group #:  Secondary Insurance: 1D#: Group #:  SCHEDULE INFORMATION: Date: 10/12/22 Time: Location: [facility]

## 2022-08-25 ENCOUNTER — Encounter: Payer: Self-pay | Admitting: Internal Medicine

## 2022-08-25 DIAGNOSIS — F419 Anxiety disorder, unspecified: Secondary | ICD-10-CM

## 2022-08-26 MED ORDER — CITALOPRAM HYDROBROMIDE 20 MG PO TABS: 20.0000 mg | ORAL_TABLET | Freq: Every day | ORAL | 1 refills | Status: DC

## 2022-08-28 ENCOUNTER — Encounter: Payer: Self-pay | Admitting: Internal Medicine

## 2022-09-09 DIAGNOSIS — H40023 Open angle with borderline findings, high risk, bilateral: Secondary | ICD-10-CM | POA: Diagnosis not present

## 2022-09-09 DIAGNOSIS — H4322 Crystalline deposits in vitreous body, left eye: Secondary | ICD-10-CM | POA: Diagnosis not present

## 2022-09-09 DIAGNOSIS — E119 Type 2 diabetes mellitus without complications: Secondary | ICD-10-CM | POA: Diagnosis not present

## 2022-09-09 DIAGNOSIS — H2513 Age-related nuclear cataract, bilateral: Secondary | ICD-10-CM | POA: Diagnosis not present

## 2022-09-09 LAB — HM DIABETES EYE EXAM

## 2022-09-11 ENCOUNTER — Other Ambulatory Visit: Payer: Self-pay

## 2022-09-15 ENCOUNTER — Encounter: Payer: Self-pay | Admitting: Internal Medicine

## 2022-09-16 MED ORDER — ATORVASTATIN CALCIUM 40 MG PO TABS: 40.0000 mg | ORAL_TABLET | Freq: Every day | ORAL | 1 refills | Status: DC

## 2022-09-17 MED ORDER — ATORVASTATIN CALCIUM 40 MG PO TABS: 40.0000 mg | ORAL_TABLET | Freq: Every day | ORAL | 1 refills | Status: DC

## 2022-09-17 NOTE — Addendum Note (Signed)
Addended by: Kavin Leech E on: 09/17/2022 10:54 AM   Modules accepted: Orders

## 2022-09-22 ENCOUNTER — Ambulatory Visit
Admission: RE | Admit: 2022-09-22 | Discharge: 2022-09-22 | Disposition: A | Discharge status: Home / Self Care | Payer: Medicare HMO | Source: Ambulatory Visit | Attending: Internal Medicine | Admitting: Internal Medicine

## 2022-09-22 DIAGNOSIS — Z1231 Encounter for screening mammogram for malignant neoplasm of breast: Secondary | ICD-10-CM | POA: Diagnosis not present

## 2022-09-24 ENCOUNTER — Encounter: Payer: Self-pay | Admitting: Internal Medicine

## 2022-09-25 MED ORDER — SITAGLIPTIN PHOSPHATE 50 MG PO TABS: 50.0000 mg | ORAL_TABLET | Freq: Every day | ORAL | 1 refills | Status: DC

## 2022-09-25 NOTE — Addendum Note (Signed)
Addended by: Lorre Munroe on: 09/25/2022 10:57 AM   Modules accepted: Orders

## 2022-10-05 ENCOUNTER — Encounter: Payer: Self-pay | Admitting: Gastroenterology

## 2022-10-06 ENCOUNTER — Telehealth: Payer: Self-pay

## 2022-10-06 NOTE — Telephone Encounter (Signed)
Patient contacted office to make sure that her rash will not interfere with her having her colonoscopy on Monday.  The rash is all over and she said it came from her laundry detergent.  She said its not a painful rash and she has been treating it with OTC medication.   Chart reviewed she messaged her PCP  on 08/28/22 that she  received both Shingles vaccines last year: 06/12/21 and 09/09/21.   I informed her that I did not think it would cause any problems with her having her colonoscopy as scheduled on 10/12/22 however if it gets worse she can reschedule.  Thanks,  [place], [place]

## 2022-10-12 ENCOUNTER — Ambulatory Visit: Payer: Medicare HMO | Admitting: Anesthesiology

## 2022-10-12 ENCOUNTER — Encounter: Payer: Self-pay | Admitting: Gastroenterology

## 2022-10-12 ENCOUNTER — Ambulatory Visit
Admission: RE | Admit: 2022-10-12 | Discharge: 2022-10-12 | Disposition: A | Discharge status: Home / Self Care | Payer: Medicare HMO | Source: Home / Self Care | Attending: Gastroenterology | Admitting: Gastroenterology

## 2022-10-12 ENCOUNTER — Encounter
Admission: RE | Disposition: A | Discharge status: Home / Self Care | Payer: Self-pay | Source: Home / Self Care | Attending: Gastroenterology

## 2022-10-12 ENCOUNTER — Other Ambulatory Visit: Payer: Self-pay

## 2022-10-12 DIAGNOSIS — D124 Benign neoplasm of descending colon: Secondary | ICD-10-CM | POA: Diagnosis not present

## 2022-10-12 DIAGNOSIS — K649 Unspecified hemorrhoids: Secondary | ICD-10-CM | POA: Insufficient documentation

## 2022-10-12 DIAGNOSIS — Z87891 Personal history of nicotine dependence: Secondary | ICD-10-CM | POA: Insufficient documentation

## 2022-10-12 DIAGNOSIS — K635 Polyp of colon: Secondary | ICD-10-CM | POA: Diagnosis not present

## 2022-10-12 DIAGNOSIS — Z1211 Encounter for screening for malignant neoplasm of colon: Secondary | ICD-10-CM

## 2022-10-12 DIAGNOSIS — R195 Other fecal abnormalities: Secondary | ICD-10-CM | POA: Diagnosis not present

## 2022-10-12 DIAGNOSIS — D120 Benign neoplasm of cecum: Secondary | ICD-10-CM | POA: Insufficient documentation

## 2022-10-12 HISTORY — PX: COLONOSCOPY WITH PROPOFOL: SHX5780

## 2022-10-12 HISTORY — PX: POLYPECTOMY: SHX5525

## 2022-10-12 LAB — GLUCOSE, CAPILLARY: Glucose-Capillary: 144 mg/dL — ABNORMAL HIGH (ref 70–99)

## 2022-10-12 SURGERY — COLONOSCOPY WITH PROPOFOL
Anesthesia: General

## 2022-10-12 MED ORDER — PROPOFOL 1000 MG/100ML IV EMUL
INTRAVENOUS | Status: AC
  Filled 2022-10-12: qty 100

## 2022-10-12 MED ORDER — PHENYLEPHRINE 80 MCG/ML (10ML) SYRINGE FOR IV PUSH (FOR BLOOD PRESSURE SUPPORT)
PREFILLED_SYRINGE | INTRAVENOUS | Status: AC
  Filled 2022-10-12: qty 10

## 2022-10-12 MED ORDER — PROPOFOL 500 MG/50ML IV EMUL
INTRAVENOUS | Status: DC | PRN
  Administered 2022-10-12: 145 ug/kg/min via INTRAVENOUS

## 2022-10-12 MED ORDER — PHENYLEPHRINE HCL (PRESSORS) 10 MG/ML IV SOLN
INTRAVENOUS | Status: DC | PRN
  Administered 2022-10-12: 160 ug via INTRAVENOUS

## 2022-10-12 MED ORDER — SODIUM CHLORIDE 0.9 % IV SOLN: INTRAVENOUS | Status: DC

## 2022-10-12 NOTE — Anesthesia Postprocedure Evaluation (Signed)
Anesthesia Post Note  Patient: Maria Sloan  Procedure(s) Performed: COLONOSCOPY WITH PROPOFOL POLYPECTOMY  Patient location during evaluation: Endoscopy Anesthesia Type: General Level of consciousness: awake and alert Pain management: pain level controlled Vital Signs Assessment: post-procedure vital signs reviewed and stable Respiratory status: spontaneous breathing, nonlabored ventilation, respiratory function stable and patient connected to nasal cannula oxygen Cardiovascular status: blood pressure returned to baseline and stable Postop Assessment: no apparent nausea or vomiting Anesthetic complications: no   No notable events documented.   Last Vitals:  Vitals:   10/12/22 0800 10/12/22 0919  BP: 130/83 (!) 89/59  Pulse: 66 72  Resp: 16 14  Temp: (!) 35.8 C (!) 35.6 C  SpO2: 99% 97%    Last Pain:  Vitals:   10/12/22 0939  TempSrc:   PainSc: 0-No pain                 Cleda Mccreedy 

## 2022-10-12 NOTE — Op Note (Signed)
[facility] Gastroenterology Patient Name: Maria Sloan Procedure Date: 10/12/2022 8:38 AM MRN: [id number] Account #: [id number] Date of Birth: Sep 10, 1950 Admit Type: Outpatient Age: 72 Room: [facility] ENDO ROOM 4 Gender: Female Note Status: Finalized Instrument Name: Peds Colonoscope [id number] Procedure:             Colonoscopy Indications:           This is the patient's first colonoscopy, Positive                         Cologuard test Providers:             Toney Reil MD, MD Referring MD:          Lorre Munroe (Referring MD) Medicines:             General Anesthesia Complications:         No immediate complications. Estimated blood loss: None. Procedure:             Pre-Anesthesia Assessment:                        - Prior to the procedure, a History and Physical was                         performed, and patient medications and allergies were                         reviewed. The patient is competent. The risks and                         benefits of the procedure and the sedation options and                         risks were discussed with the patient. All questions                         were answered and informed consent was obtained.                         Patient identification and proposed procedure were                         verified by the physician, the nurse, the                         anesthesiologist, the anesthetist and the technician                         in the pre-procedure area in the procedure room in the                         endoscopy suite. Mental Status Examination: alert and                         oriented. Airway Examination: normal oropharyngeal                         airway and neck mobility. Respiratory Examination:  clear to auscultation. CV Examination: normal.                         Prophylactic Antibiotics: The patient does not require                         prophylactic antibiotics.  Prior Anticoagulants: The                         patient has taken no anticoagulant or antiplatelet                         agents. ASA Grade Assessment: III - A patient with                         severe systemic disease. After reviewing the risks and                         benefits, the patient was deemed in satisfactory                         condition to undergo the procedure. The anesthesia                         plan was to use general anesthesia. Immediately prior                         to administration of medications, the patient was                         re-assessed for adequacy to receive sedatives. The                         heart rate, respiratory rate, oxygen saturations,                         blood pressure, adequacy of pulmonary ventilation, and                         response to care were monitored throughout the                         procedure. The physical status of the patient was                         re-assessed after the procedure.                        After obtaining informed consent, the colonoscope was                         passed under direct vision. Throughout the procedure,                         the patient's blood pressure, pulse, and oxygen                         saturations were monitored continuously. The  Colonoscope was introduced through the anus and                         advanced to the the cecum, identified by appendiceal                         orifice and ileocecal valve. The colonoscopy was                         performed without difficulty. The ileocecal valve,                         appendiceal orifice, and rectum were photographed. Findings:      The perianal and digital rectal examinations were normal. Pertinent       negatives include normal sphincter tone and no palpable rectal lesions.      Four sessile polyps were found in the descending colon, transverse colon       and cecum. The polyps  were 4 to 5 mm in size. These polyps were removed       with a cold snare. Resection and retrieval were complete.      Hemorrhoids were found during retroflexion. The hemorrhoids were small. Impression:            - Four 4 to 5 mm polyps in the descending colon, in                         the transverse colon and in the cecum, removed with a                         cold snare. Resected and retrieved.                        - Hemorrhoids. Recommendation:        - Discharge patient to home (with escort).                        - Resume previous diet today.                        - Continue present medications.                        - Await pathology results.                        - Repeat colonoscopy in 5-10 years for surveillance                         based on pathology results. Procedure Code(s):     --- Professional ---                        [phone], Colonoscopy, flexible; with removal of                         tumor(s), polyp(s), or other lesion(s) by snare                         technique Diagnosis Code(s):     --- Professional ---  D12.4, Benign neoplasm of descending colon                        D12.3, Benign neoplasm of transverse colon (hepatic                         flexure or splenic flexure)                        D12.0, Benign neoplasm of cecum                        K64.9, Unspecified hemorrhoids                        R19.5, Other fecal abnormalities CPT copyright 2022 American Medical Association. All rights reserved. The codes documented in this report are preliminary and upon coder review may  be revised to meet current compliance requirements. Dr. Libby Maw Toney Reil MD, MD 10/12/2022 9:18:29 AM This report has been signed electronically. Number of Addenda: 0 Note Initiated On: 10/12/2022 8:38 AM Scope Withdrawal Time: 0 hours 26 minutes 10 seconds  Total Procedure Duration: 0 hours 30 minutes 10 seconds  Estimated Blood Loss:   Estimated blood loss: none.      [facility]

## 2022-10-12 NOTE — Anesthesia Preprocedure Evaluation (Signed)
Anesthesia Evaluation  Patient identified by MRN, date of birth, ID band Patient awake    Reviewed: Allergy & Precautions, NPO status , Patient's Chart, lab work & pertinent test results  History of Anesthesia Complications Negative for: history of anesthetic complications  Airway Mallampati: III  TM Distance: <3 FB Neck ROM: full    Dental  (+) Chipped   Pulmonary neg shortness of breath, former smoker   Pulmonary exam normal        Cardiovascular Exercise Tolerance: Good (-) angina Normal cardiovascular exam     Neuro/Psych  PSYCHIATRIC DISORDERS      negative neurological ROS     GI/Hepatic negative GI ROS, Neg liver ROS,,,  Endo/Other  diabetes, Type 2    Renal/GU Renal disease  negative genitourinary   Musculoskeletal   Abdominal   Peds  Hematology negative hematology ROS (+)   Anesthesia Other Findings Past Medical History: No date: Anxiety No date: Arthritis No date: Cervical cancer (HCC) No date: Hyperlipidemia No date: PVD (peripheral vascular disease) (HCC)     Comment:  R leg venous insufficiency No date: PVD (peripheral vascular disease) (HCC) No date: Shingles No date: Type 2 diabetes mellitus (HCC)  Past Surgical History: No date: PARTIAL HYSTERECTOMY     Comment:  ovaries remain No date: ROTATOR CUFF REPAIR; Right  BMI    Body Mass Index: 27.29 kg/m      Reproductive/Obstetrics negative OB ROS                             Anesthesia Physical Anesthesia Plan  ASA: 3  Anesthesia Plan: General   Post-op Pain Management:    Induction: Intravenous  PONV Risk Score and Plan: Propofol infusion and TIVA  Airway Management Planned: Natural Airway and Nasal Cannula  Additional Equipment:   Intra-op Plan:   Post-operative Plan:   Informed Consent: I have reviewed the patients History and Physical, chart, labs and discussed the procedure including the  risks, benefits and alternatives for the proposed anesthesia with the patient or authorized representative who has indicated his/her understanding and acceptance.     Dental Advisory Given  Plan Discussed with: Anesthesiologist, CRNA and Surgeon  Anesthesia Plan Comments: (Patient consented for risks of anesthesia including but not limited to:  - adverse reactions to medications - risk of airway placement if required - damage to eyes, teeth, lips or other oral mucosa - nerve damage due to positioning  - sore throat or hoarseness - Damage to heart, brain, nerves, lungs, other parts of body or loss of life  Patient voiced understanding.)       Anesthesia Quick Evaluation

## 2022-10-12 NOTE — Transfer of Care (Signed)
Immediate Anesthesia Transfer of Care Note  Patient: Maria Sloan  Procedure(s) Performed: COLONOSCOPY WITH PROPOFOL POLYPECTOMY  Patient Location: PACU  Anesthesia Type:General  Level of Consciousness: awake and alert   Airway & Oxygen Therapy: Patient Spontanous Breathing and Patient connected to nasal cannula oxygen  Post-op Assessment: Report given to RN and Post -op Vital signs reviewed and stable  Post vital signs: Reviewed and stable  Last Vitals:  Vitals Value Taken Time  BP    Temp    Pulse    Resp    SpO2      Last Pain:  Vitals:   10/12/22 0800  TempSrc: Temporal  PainSc: 0-No pain         Complications: No notable events documented.

## 2022-10-12 NOTE — H&P (Signed)
Arlyss Repress, MD [address]  [address]  [place], [place] [address]  Main: [phone]  Fax: [phone] Pager: [phone]  Primary Care Physician:  Lorre Munroe, NP Primary Gastroenterologist:  Dr. Arlyss Repress  Pre-Procedure History & Physical: HPI:  Maria Sloan is a 72 y.o. female is here for an colonoscopy.   Past Medical History:  Diagnosis Date   Anxiety    Arthritis    Cervical cancer (HCC)    Hyperlipidemia    PVD (peripheral vascular disease) (HCC)    R leg venous insufficiency   PVD (peripheral vascular disease) (HCC)    Shingles    Type 2 diabetes mellitus (HCC)     Past Surgical History:  Procedure Laterality Date   PARTIAL HYSTERECTOMY     ovaries remain   ROTATOR CUFF REPAIR Right     Prior to Admission medications   Medication Sig Start Date End Date Taking? Authorizing Provider  CALCIUM CITRATE PO Take 1 tablet by mouth daily.    Yes [provider]  Cholecalciferol (VITAMIN D PO) Take 1 tablet by mouth daily.    Yes [provider]  Multiple Vitamin (MULTIVITAMIN) tablet Take 1 tablet by mouth daily.   Yes [provider]  Omega-3 Fatty Acids (FISH OIL) 1000 MG CAPS Take 1,000 mg by mouth 3 (three) times daily.   Yes [provider]  Potassium 75 MG TABS Take 1 tablet by mouth daily.   Yes [provider]  Accu-Chek Softclix Lancets lancets ONE TIME DAILY AS DIRECTED. FOR ICD-10 E10.9, E11.9 A1C 02/11/2018- 11.8% 07/30/19   Malfi, Jodelle Gross, FNP  atorvastatin (LIPITOR) 40 MG tablet Take 1 tablet (40 mg total) by mouth daily. 09/17/22   Lorre Munroe, NP  Blood Glucose Monitoring Suppl (ACCU-CHEK AVIVA PLUS) w/Device KIT 1 Device by Does not apply route as directed. . One time daily as directed. FOR ICD-10 E10.9, E11.9 A1C 02/11/2018- 11.8% 02/15/18   Galen Manila, NP  busPIRone (BUSPAR) 5 MG tablet TAKE 1 TABLET BY MOUTH EVERYDAY AT BEDTIME 07/31/22   Lorre Munroe, NP  citalopram  (CELEXA) 20 MG tablet Take 1 tablet (20 mg total) by mouth daily. 08/26/22   Lorre Munroe, NP  ezetimibe (ZETIA) 10 MG tablet TAKE 1 TABLET BY MOUTH EVERY DAY 06/04/22   Lorre Munroe, NP  fluticasone (FLONASE) 50 MCG/ACT nasal spray PLACE 2 SPRAYS INTO BOTH NOSTRILS DAILY. USE FOR 4-6 WEEKS THEN STOP AND USE AS NEEDED 08/17/19   Malfi, Jodelle Gross, FNP  glimepiride (AMARYL) 2 MG tablet TAKE 1 TABLET BY MOUTH EVERY DAY WITH BREAKFAST 07/31/22   Lorre Munroe, NP  glucose blood (ACCU-CHEK AVIVA PLUS) test strip USE UP TO ONE TIME AS DIRECTED 10/23/21   Lorre Munroe, NP  lisinopril (ZESTRIL) 5 MG tablet TAKE 1 TABLET (5 MG TOTAL) BY MOUTH DAILY. 05/20/22   Lorre Munroe, NP  metFORMIN (GLUCOPHAGE) 1000 MG tablet TAKE 1 TABLET (1,000 MG TOTAL) BY MOUTH TWICE A DAY WITH FOOD 06/04/22   Lorre Munroe, NP  sitaGLIPtin (JANUVIA) 50 MG tablet Take 1 tablet (50 mg total) by mouth daily. Patient not taking: Reported on 10/12/2022 09/25/22   Lorre Munroe, NP    Allergies as of 08/21/2022 - Review Complete 08/21/2022  Allergen Reaction Noted   Prochlorperazine Other (See Comments) 11/29/2014   Other Hives 09/13/2019   Triprolidine-pseudoephedrine Other (See Comments) 08/07/2022   Actifed cold-allergy [chlorpheniramine-phenylephrine]  11/29/2014   Penicillins  11/29/2014    Family History  Problem Relation Age of Onset   Stroke Mother    Emphysema Father    Lung cancer Father    Hyperlipidemia Father    Hyperlipidemia Sister    Healthy Son    Colon cancer Neg Hx    Breast cancer Neg Hx    Ovarian cancer Neg Hx     Social History   Socioeconomic History   Marital status: Divorced    Spouse name: Not on file   Number of children: Not on file   Years of education: Not on file   Highest education level: Bachelor's degree (e.g., BA, AB, BS)  Occupational History   Occupation: retired   Tobacco Use   Smoking status: Former    Current packs/day: 0.00    Average packs/day: 1 pack/day for  25.0 years (25.0 ttl pk-yrs)    Types: Cigarettes    Start date: 11/28/1969    Quit date: 11/29/1994    Years since quitting: 27.8   Smokeless tobacco: Never  Vaping Use   Vaping status: Never Used  Substance and Sexual Activity   Alcohol use: Yes    Alcohol/week: 0.0 standard drinks of alcohol    Comment: occasionally   Drug use: Never   Sexual activity: Not Currently    Birth control/protection: None  Other Topics Concern   Not on file  Social History Narrative   Divorced.   1 son.    Moved from [place].   Enjoys making jewelry, spending time with family, crafts, reading.   Social Determinants of Health   Financial Resource Strain: Low Risk  (08/07/2022)   Overall Financial Resource Strain (CARDIA)    Difficulty of Paying Living Expenses: Not very hard  Food Insecurity: No Food Insecurity (08/07/2022)   Hunger Vital Sign    Worried About Running Out of Food in the Last Year: Never true    Ran Out of Food in the Last Year: Never true  Transportation Needs: No Transportation Needs (08/07/2022)   PRAPARE - Administrator, Civil Service (Medical): No    Lack of Transportation (Non-Medical): No  Physical Activity: Sufficiently Active (08/07/2022)   Exercise Vital Sign    Days of Exercise per Week: 4 days    Minutes of Exercise per Session: 50 min  Stress: No Stress Concern Present (08/07/2022)   [employer] of Occupational Health - Occupational Stress Questionnaire    Feeling of Stress : Only a little  Recent Concern: Stress - Stress Concern Present (07/31/2022)   [employer] of Occupational Health - Occupational Stress Questionnaire    Feeling of Stress : To some extent  Social Connections: Socially Isolated (08/07/2022)   Social Connection and Isolation Panel [NHANES]    Frequency of Communication with Friends and Family: More than three times a week    Frequency of Social Gatherings with Friends and Family: Once a week    Attends Religious Services: Never     Database administrator or Organizations: No    Attends Banker Meetings: Never    Marital Status: Divorced  Catering manager Violence: Not At Risk (08/07/2022)   Humiliation, Afraid, Rape, and Kick questionnaire    Fear of Current or Ex-Partner: No    Emotionally Abused: No    Physically Abused: No    Sexually Abused: No    Review of Systems: See HPI, otherwise negative ROS  Physical Exam: BP 130/83   Pulse 66   Temp (!) 96.4  F (35.8 C) (Temporal)   Resp 16   Ht 5\' 5"  (1.651 m)   Wt 74.4 kg   SpO2 99%   BMI 27.29 kg/m  General:   Alert,  pleasant and cooperative in NAD Head:  Normocephalic and atraumatic. Neck:  Supple; no masses or thyromegaly. Lungs:  Clear throughout to auscultation.    Heart:  Regular rate and rhythm. Abdomen:  Soft, nontender and nondistended. Normal bowel sounds, without guarding, and without rebound.   Neurologic:  Alert and  oriented x4;  grossly normal neurologically.  Impression/Plan: Maria Sloan is here for an colonoscopy to be performed for positive cologuard  Risks, benefits, limitations, and alternatives regarding  colonoscopy have been reviewed with the patient.  Questions have been answered.  All parties agreeable.   Lannette Donath, MD  10/12/2022, 8:05 AM

## 2022-10-13 ENCOUNTER — Encounter: Payer: Self-pay | Admitting: Gastroenterology

## 2022-10-14 ENCOUNTER — Encounter: Payer: Self-pay | Admitting: Gastroenterology

## 2022-10-14 ENCOUNTER — Other Ambulatory Visit: Payer: Self-pay | Admitting: Internal Medicine

## 2022-10-14 DIAGNOSIS — E1165 Type 2 diabetes mellitus with hyperglycemia: Secondary | ICD-10-CM

## 2022-10-14 DIAGNOSIS — E782 Mixed hyperlipidemia: Secondary | ICD-10-CM

## 2022-10-14 DIAGNOSIS — E1129 Type 2 diabetes mellitus with other diabetic kidney complication: Secondary | ICD-10-CM

## 2022-10-15 MED ORDER — METFORMIN HCL 1000 MG PO TABS: 1000.0000 mg | ORAL_TABLET | Freq: Two times a day (BID) | ORAL | 1 refills | Status: DC

## 2022-10-16 NOTE — Telephone Encounter (Signed)
Requested Prescriptions  Pending Prescriptions Disp Refills   ezetimibe (ZETIA) 10 MG tablet [Pharmacy Med Name: EZETIMIBE 10 MG TABLET] 90 tablet 0    Sig: TAKE 1 TABLET BY MOUTH EVERY DAY     Cardiovascular:  Antilipid - Sterol Transport Inhibitors Failed - 10/14/2022  9:16 PM      Failed - Lipid Panel in normal range within the last 12 months    Cholesterol  Date Value Ref Range Status  08/04/2022 101 <200 mg/dL Final   LDL Cholesterol (Calc)  Date Value Ref Range Status  08/04/2022 41 mg/dL (calc) Final    Comment:    Reference range: <100 . Desirable range <100 mg/dL for primary prevention;   <70 mg/dL for patients with CHD or diabetic patients  with > or = 2 CHD risk factors. Marland Kitchen LDL-C is now calculated using the Martin-Hopkins  calculation, which is a validated novel method providing  better accuracy than the Friedewald equation in the  estimation of LDL-C.  Horald Pollen et al. Lenox Ahr. 1191;478(29): 2061-2068  (http://education.[facility].com/faq/FAQ164)    Direct LDL  Date Value Ref Range Status  12/13/2015 115.0 mg/dL Final    Comment:    Optimal:  <100 mg/dLNear or Above Optimal:  100-129 mg/dLBorderline High:  130-159 mg/dLHigh:  160-189 mg/dLVery High:  >190 mg/dL   HDL  Date Value Ref Range Status  08/04/2022 37 (L) > OR = 50 mg/dL Final   Triglycerides  Date Value Ref Range Status  08/04/2022 144 <150 mg/dL Final         Passed - AST in normal range and within 360 days    AST  Date Value Ref Range Status  08/04/2022 20 10 - 35 U/L Final         Passed - ALT in normal range and within 360 days    ALT  Date Value Ref Range Status  08/04/2022 26 6 - 29 U/L Final         Passed - Patient is not pregnant      Passed - Valid encounter within last 12 months    Recent Outpatient Visits           2 months ago Encounter for general adult medical examination with abnormal findings   [facility] [place], Salvadore Oxford, NP   5  months ago Type 2 diabetes mellitus with hyperglycemia, without long-term current use of insulin [facility])   [facility] [place], [place] W, NP   1 year ago Type 2 diabetes mellitus with hyperglycemia, without long-term current use of insulin [place])   [place] [facility] [place], Salvadore Oxford, NP   1 year ago Encounter for general adult medical examination with abnormal findings   [place] [facility] [place], Salvadore Oxford, NP   1 year ago Hyponatremia   [facility] [place], Salvadore Oxford, NP       Future Appointments             In 3 months [name], Salvadore Oxford, NP [place] [facility], [facility]             lisinopril (ZESTRIL) 5 MG tablet [Pharmacy Med Name: LISINOPRIL 5 MG TABLET] 90 tablet 0    Sig: TAKE 1 TABLET (5 MG TOTAL) BY MOUTH DAILY.     Cardiovascular:  ACE Inhibitors Failed - 10/14/2022  9:16 PM      Failed - Cr in normal  range and within 180 days    Creat  Date Value Ref Range Status  08/04/2022 0.59 (L) 0.60 - 1.00 mg/dL Final   Creatinine,U  Date Value Ref Range Status  12/13/2015 153.6 mg/dL Final   Creatinine, Urine  Date Value Ref Range Status  08/04/2022 104 20 - 275 mg/dL Final         Failed - Last BP in normal range    BP Readings from Last 1 Encounters:  10/12/22 (!) 89/59         Passed - K in normal range and within 180 days    Potassium  Date Value Ref Range Status  08/04/2022 4.6 3.5 - 5.3 mmol/L Final         Passed - Patient is not pregnant      Passed - Valid encounter within last 6 months    Recent Outpatient Visits           2 months ago Encounter for general adult medical examination with abnormal findings   [place] [facility] [place], [place] W, NP   5 months ago Type 2 diabetes mellitus with hyperglycemia, without long-term current use of insulin [facility])   [place] [facility] [place], [place] W, NP   1 year ago  Type 2 diabetes mellitus with hyperglycemia, without long-term current use of insulin [facility])   [place] [facility] [place], Salvadore Oxford, NP   1 year ago Encounter for general adult medical examination with abnormal findings   [place] [facility] [place], Salvadore Oxford, NP   1 year ago Hyponatremia   [place] [facility] [place], Salvadore Oxford, NP       Future Appointments             In 3 months [name], Salvadore Oxford, NP [place] [facility], [facility]

## 2022-11-19 IMAGING — CR DG CHEST 2V
1 series · 2 of 2 positions shown · non-contrast
Comparison: 02/07/2015

CLINICAL DATA: Cough, chest congestion, sinus congestion, sore
throat, fever, body aches, and shortness of breath since [REDACTED]

EXAM:
CHEST - 2 VIEW

[Series 1: dg chest 2 view · 0.14mm/px · 2 of 2 slices shown]
[im 1/2]
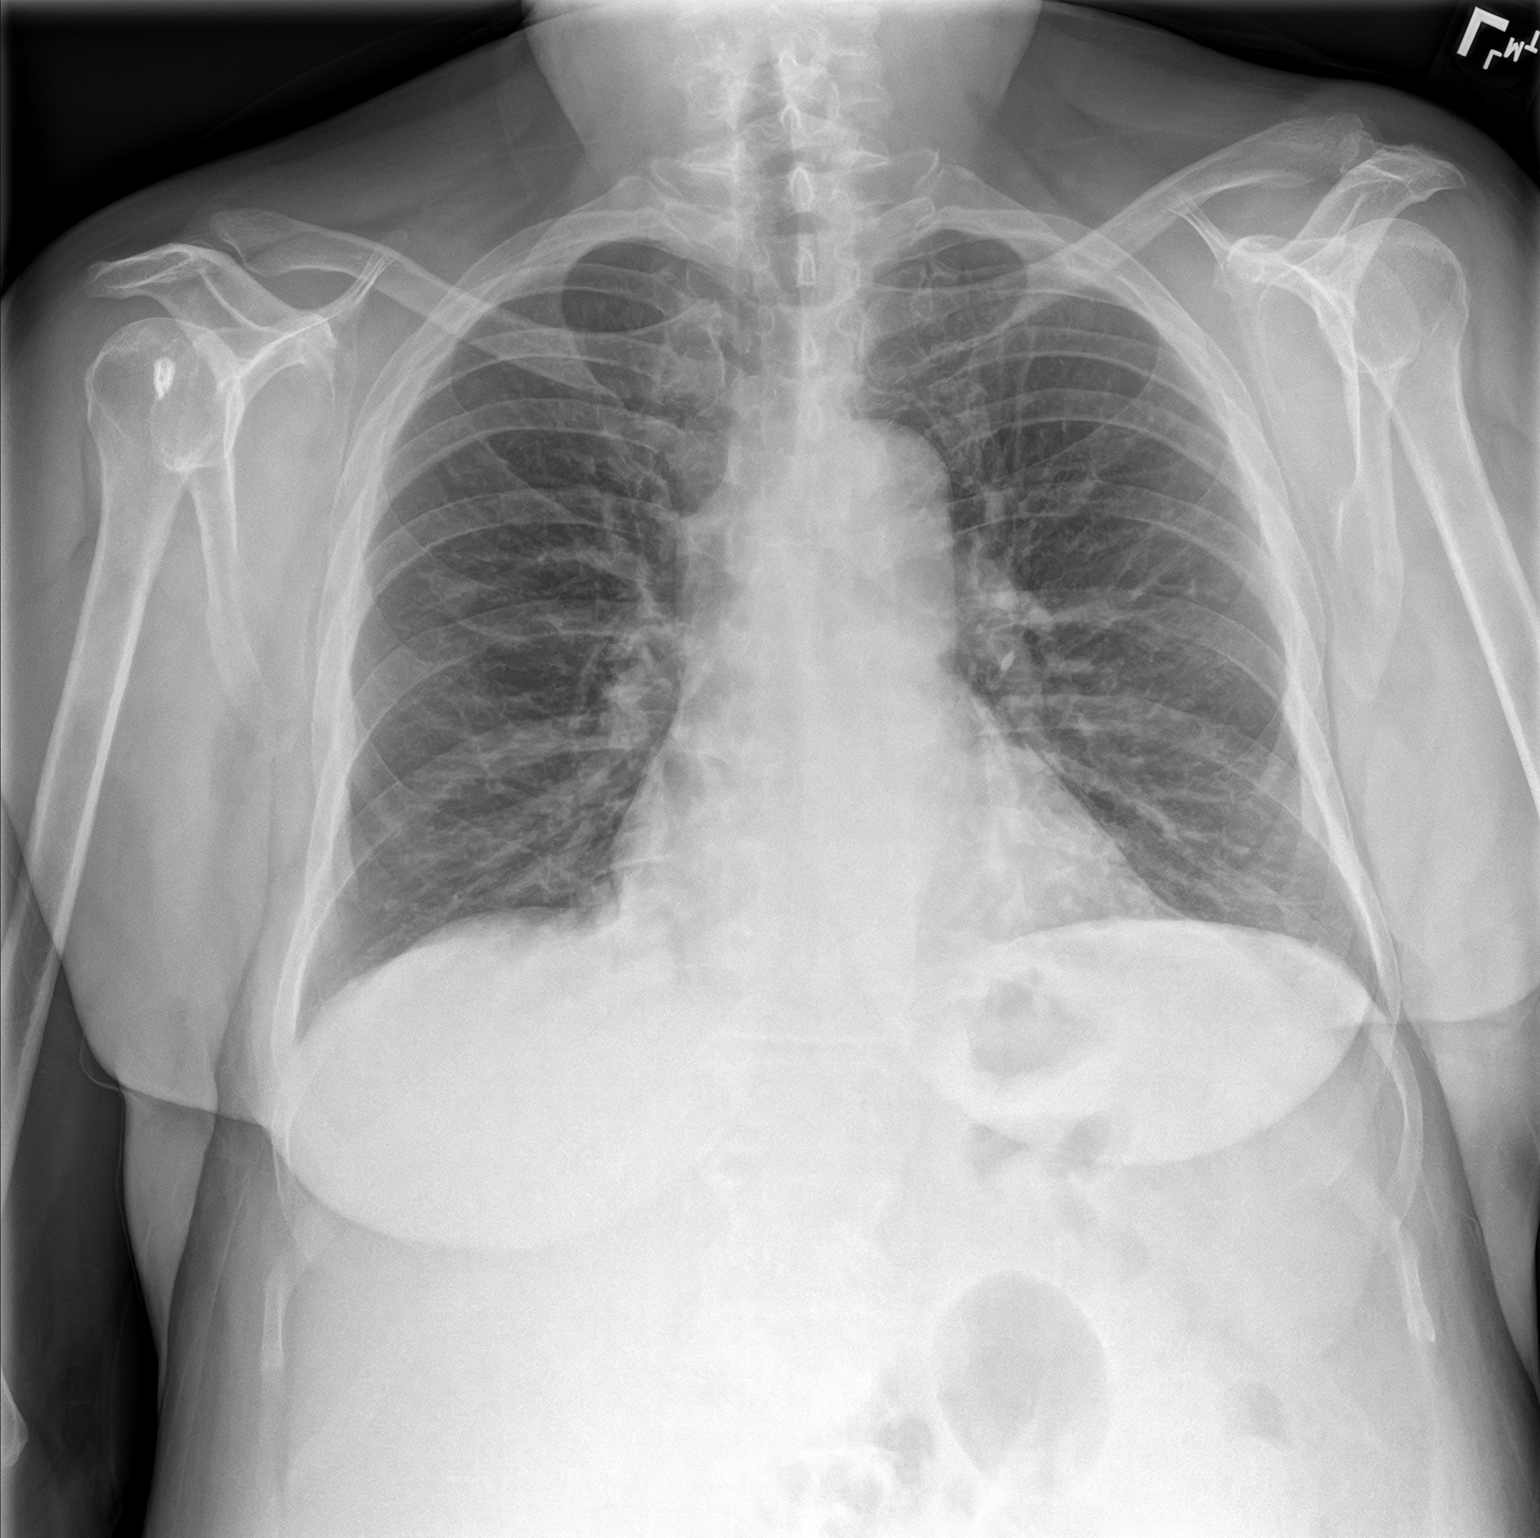
[im 2/2]
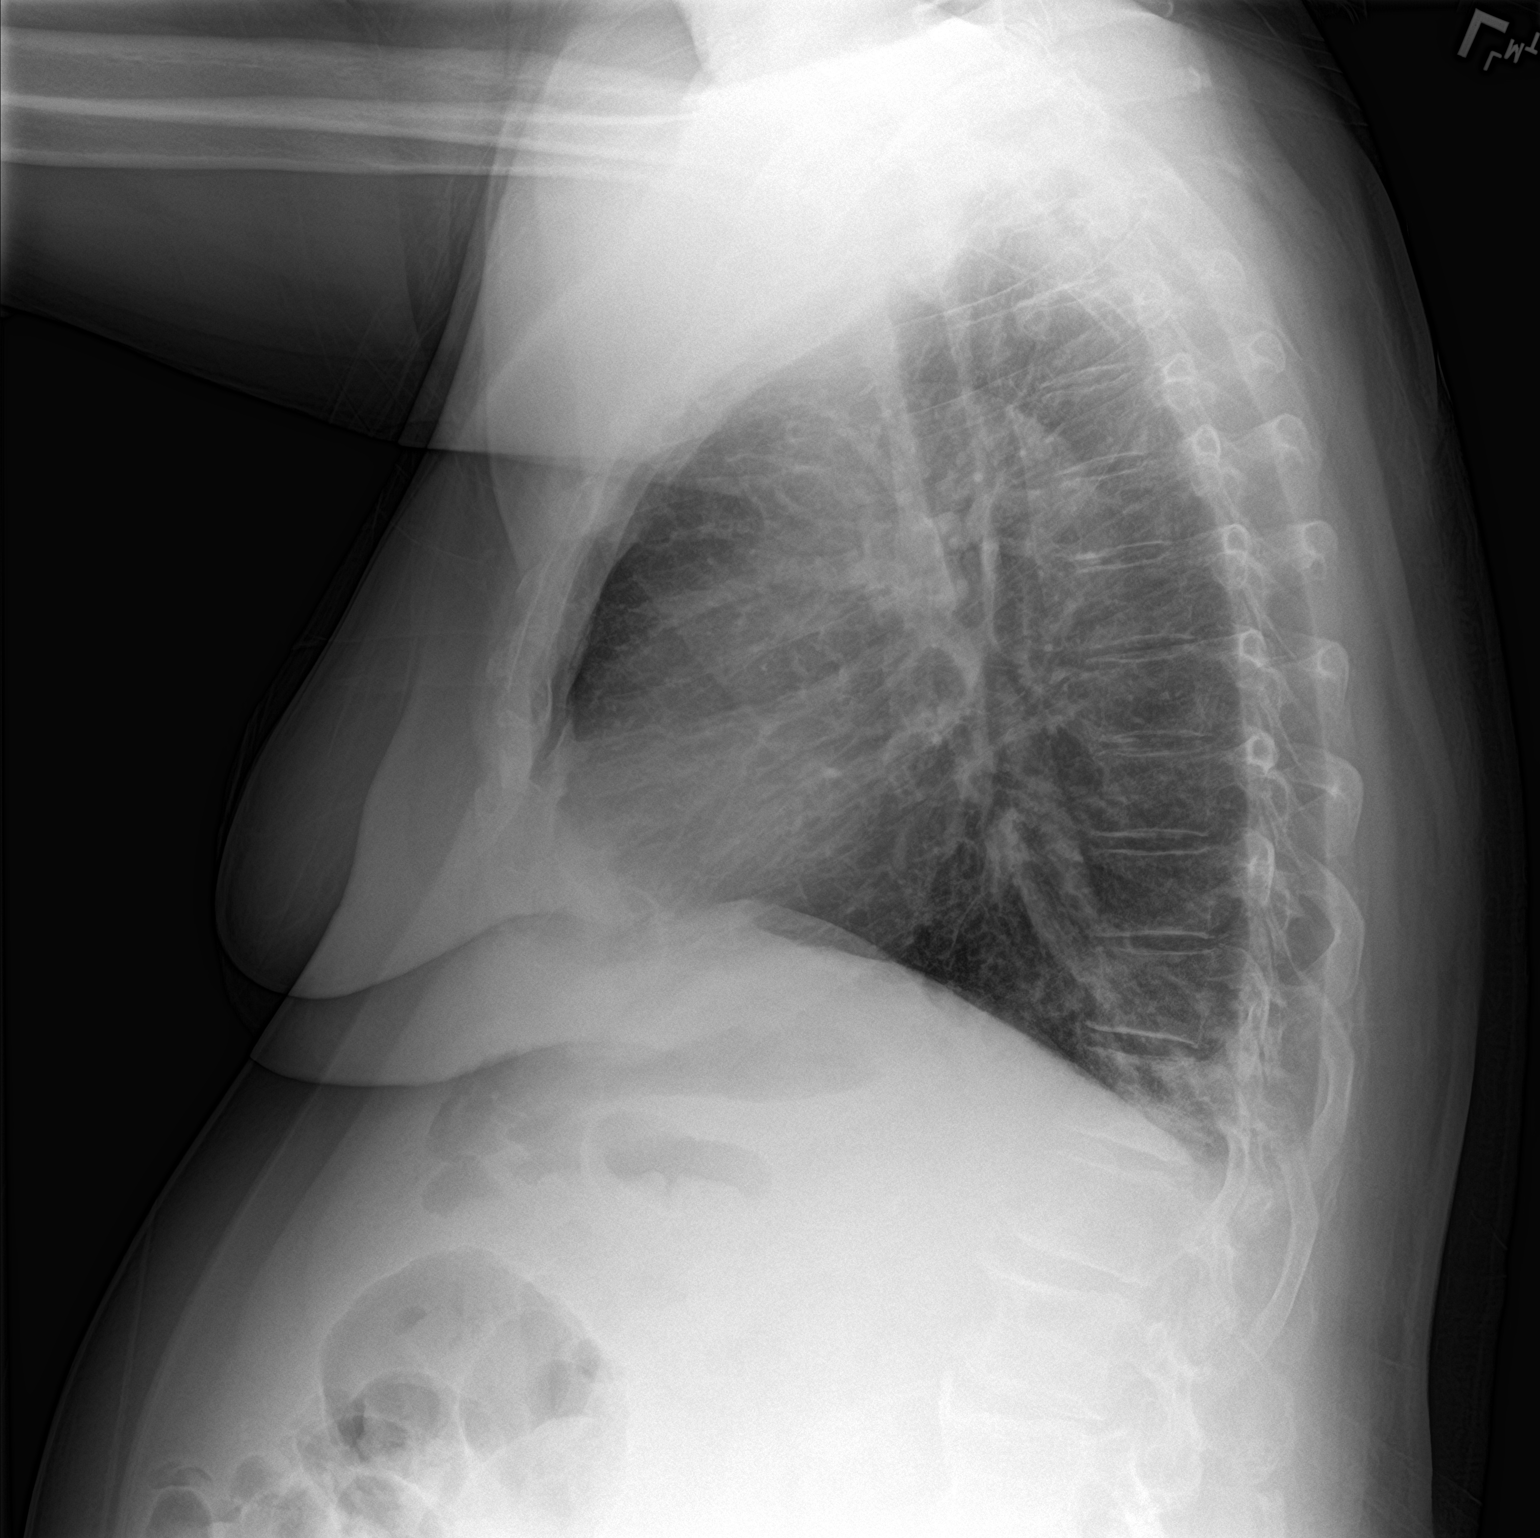

[2 of 2 positions shown; findings below may reference images not displayed]

FINDINGS: Normal heart size, mediastinal contours, and pulmonary vascularity.

Streaky bibasilar opacities favor atelectasis over infiltrate.

Remaining lungs clear.

No pleural effusion or pneumothorax.

Bones appear demineralized.
IMPRESSION: Streaky bibasilar opacities likely representing atelectasis.

## 2022-11-20 ENCOUNTER — Encounter: Payer: Self-pay | Admitting: Internal Medicine

## 2022-12-30 ENCOUNTER — Encounter: Payer: Self-pay | Admitting: Internal Medicine

## 2022-12-30 MED ORDER — METFORMIN HCL 1000 MG PO TABS: 1000.0000 mg | ORAL_TABLET | Freq: Two times a day (BID) | ORAL | 0 refills | Status: DC

## 2023-01-13 ENCOUNTER — Other Ambulatory Visit: Payer: Self-pay | Admitting: Internal Medicine

## 2023-01-13 DIAGNOSIS — E782 Mixed hyperlipidemia: Secondary | ICD-10-CM

## 2023-01-14 NOTE — Telephone Encounter (Signed)
Requested Prescriptions  Pending Prescriptions Disp Refills   ezetimibe (ZETIA) 10 MG tablet [Pharmacy Med Name: EZETIMIBE 10 MG TABLET] 90 tablet 1    Sig: TAKE 1 TABLET BY MOUTH EVERY DAY     Cardiovascular:  Antilipid - Sterol Transport Inhibitors Failed - 01/13/2023  1:32 AM      Failed - Lipid Panel in normal range within the last 12 months    Cholesterol  Date Value Ref Range Status  08/04/2022 101 <200 mg/dL Final   LDL Cholesterol (Calc)  Date Value Ref Range Status  08/04/2022 41 mg/dL (calc) Final    Comment:    Reference range: <100 . Desirable range <100 mg/dL for primary prevention;   <70 mg/dL for patients with CHD or diabetic patients  with > or = 2 CHD risk factors. Marland Kitchen LDL-C is now calculated using the Martin-Hopkins  calculation, which is a validated novel method providing  better accuracy than the Friedewald equation in the  estimation of LDL-C.  Horald Pollen et al. Lenox Ahr. 1610;960(45): 2061-2068  (http://education.[facility].com/faq/FAQ164)    Direct LDL  Date Value Ref Range Status  12/13/2015 115.0 mg/dL Final    Comment:    Optimal:  <100 mg/dLNear or Above Optimal:  100-129 mg/dLBorderline High:  130-159 mg/dLHigh:  160-189 mg/dLVery High:  >190 mg/dL   HDL  Date Value Ref Range Status  08/04/2022 37 (L) > OR = 50 mg/dL Final   Triglycerides  Date Value Ref Range Status  08/04/2022 144 <150 mg/dL Final         Passed - AST in normal range and within 360 days    AST  Date Value Ref Range Status  08/04/2022 20 10 - 35 U/L Final         Passed - ALT in normal range and within 360 days    ALT  Date Value Ref Range Status  08/04/2022 26 6 - 29 U/L Final         Passed - Patient is not pregnant      Passed - Valid encounter within last 12 months    Recent Outpatient Visits           5 months ago Encounter for general adult medical examination with abnormal findings   [place] [facility] [place], Salvadore Oxford, NP   8  months ago Type 2 diabetes mellitus with hyperglycemia, without long-term current use of insulin [facility])   [place] [place], [place] W, NP   1 year ago Type 2 diabetes mellitus with hyperglycemia, without long-term current use of insulin [facility])   [place] [facility] [place], Salvadore Oxford, NP   1 year ago Encounter for general adult medical examination with abnormal findings   [place] [facility] [place], Salvadore Oxford, NP   1 year ago Hyponatremia   [place] [facility] [place], Salvadore Oxford, NP       Future Appointments             In 3 weeks Sampson Si, Salvadore Oxford, NP [facility], [facility]

## 2023-01-20 ENCOUNTER — Other Ambulatory Visit: Payer: Self-pay | Admitting: Internal Medicine

## 2023-01-20 DIAGNOSIS — E1165 Type 2 diabetes mellitus with hyperglycemia: Secondary | ICD-10-CM

## 2023-01-21 NOTE — Telephone Encounter (Signed)
Requested Prescriptions  Pending Prescriptions Disp Refills   glucose blood (ACCU-CHEK AVIVA PLUS) test strip [Pharmacy Med Name: ACCU-CHEK AVIVA PLUS TEST STRP] 100 strip 2    Sig: USE UP TO ONE TIME AS DIRECTED     Endocrinology: Diabetes - Testing Supplies Passed - 01/20/2023  1:11 AM      Passed - Valid encounter within last 12 months    Recent Outpatient Visits           5 months ago Encounter for general adult medical examination with abnormal findings   [place] [facility] [place], [place] W, NP   8 months ago Type 2 diabetes mellitus with hyperglycemia, without long-term current use of insulin [facility])   [place] [facility] [place], [place] W, NP   1 year ago Type 2 diabetes mellitus with hyperglycemia, without long-term current use of insulin [facility])   [place] [facility] [place], Salvadore Oxford, NP   1 year ago Encounter for general adult medical examination with abnormal findings   [place] [facility] [place], Salvadore Oxford, NP   1 year ago Hyponatremia   [place] [facility] [place], Salvadore Oxford, NP       Future Appointments             In 2 weeks Sampson Si, Salvadore Oxford, NP  [facility], [facility]

## 2023-01-24 ENCOUNTER — Other Ambulatory Visit: Payer: Self-pay | Admitting: Internal Medicine

## 2023-01-24 DIAGNOSIS — F419 Anxiety disorder, unspecified: Secondary | ICD-10-CM

## 2023-01-26 ENCOUNTER — Other Ambulatory Visit: Payer: Self-pay | Admitting: Internal Medicine

## 2023-01-26 DIAGNOSIS — F32A Depression, unspecified: Secondary | ICD-10-CM

## 2023-01-26 DIAGNOSIS — F419 Anxiety disorder, unspecified: Secondary | ICD-10-CM

## 2023-01-26 NOTE — Telephone Encounter (Signed)
Requested Prescriptions  Pending Prescriptions Disp Refills   busPIRone (BUSPAR) 5 MG tablet [Pharmacy Med Name: BUSPIRONE HCL 5 MG TABLET] 90 tablet 1    Sig: TAKE 1 TABLET BY MOUTH EVERYDAY AT BEDTIME     Psychiatry: Anxiolytics/Hypnotics - Non-controlled Passed - 01/26/2023  1:33 AM      Passed - Valid encounter within last 12 months    Recent Outpatient Visits           5 months ago Encounter for general adult medical examination with abnormal findings   [place] [facility] [place], Salvadore Oxford, NP   8 months ago Type 2 diabetes mellitus with hyperglycemia, without long-term current use of insulin (HCC)   [place] [facility] [employer], [place] W, NP   1 year ago Type 2 diabetes mellitus with hyperglycemia, without long-term current use of insulin (HCC)   [place] [facility] [place], [place] W, NP   1 year ago Encounter for general adult medical examination with abnormal findings   [place] [facility] [place], Salvadore Oxford, NP   1 year ago Hyponatremia   [place] [place], Salvadore Oxford, NP       Future Appointments             In 1 week Sampson Si, Salvadore Oxford, NP [place] [facility], [facility]             glimepiride (AMARYL) 2 MG tablet [Pharmacy Med Name: GLIMEPIRIDE 2 MG TABLET] 90 tablet 1    Sig: TAKE 1 TABLET BY MOUTH EVERY DAY WITH BREAKFAST     Endocrinology:  Diabetes - Sulfonylureas Failed - 01/26/2023  1:33 AM      Failed - Cr in normal range and within 360 days    Creat  Date Value Ref Range Status  08/04/2022 0.59 (L) 0.60 - 1.00 mg/dL Final   Creatinine,U  Date Value Ref Range Status  12/13/2015 153.6 mg/dL Final   Creatinine, Urine  Date Value Ref Range Status  08/04/2022 104 20 - 275 mg/dL Final         Passed - HBA1C is between 0 and 7.9 and within 180 days    Hgb A1c MFr Bld  Date Value Ref Range Status  08/04/2022 6.4 (H) <5.7 % of total Hgb Final     Comment:    For someone without known diabetes, a hemoglobin  A1c value between 5.7% and 6.4% is consistent with prediabetes and should be confirmed with a  follow-up test. . For someone with known diabetes, a value <7% indicates that their diabetes is well controlled. A1c targets should be individualized based on duration of diabetes, age, comorbid conditions, and other considerations. . This assay result is consistent with an increased risk of diabetes. . Currently, no consensus exists regarding use of hemoglobin A1c for diagnosis of diabetes for children. Verna Czech - Valid encounter within last 6 months    Recent Outpatient Visits           5 months ago Encounter for general adult medical examination with abnormal findings   [place] [facility] [place], [place] W, NP   8 months ago Type 2 diabetes mellitus with hyperglycemia, without long-term current use of insulin [facility])   [place] [place], [place] W, NP   1 year ago Type 2 diabetes mellitus with hyperglycemia,  without long-term current use of insulin [facility])   [place] [place], Salvadore Oxford, NP   1 year ago Encounter for general adult medical examination with abnormal findings   [place] [facility] [place], Salvadore Oxford, NP   1 year ago Hyponatremia   [place] [facility] [place], Salvadore Oxford, NP       Future Appointments             In 1 week Sampson Si, Salvadore Oxford, NP [place] [facility], [facility]

## 2023-01-26 NOTE — Telephone Encounter (Signed)
Requested Prescriptions  Refused Prescriptions Disp Refills   citalopram (CELEXA) 20 MG tablet [Pharmacy Med Name: CITALOPRAM HBR 20 MG TABLET] 90 tablet 1    Sig: TAKE 1 TABLET BY MOUTH EVERY DAY     Psychiatry:  Antidepressants - SSRI Passed - 01/24/2023  8:48 AM      Passed - Completed PHQ-2 or PHQ-9 in the last 360 days      Passed - Valid encounter within last 6 months    Recent Outpatient Visits           5 months ago Encounter for general adult medical examination with abnormal findings   [place] [facility] [place], [place] W, NP   8 months ago Type 2 diabetes mellitus with hyperglycemia, without long-term current use of insulin [facility])   [place] [place], [place] W, NP   1 year ago Type 2 diabetes mellitus with hyperglycemia, without long-term current use of insulin [facility])   [place] [facility] [place], Salvadore Oxford, NP   1 year ago Encounter for general adult medical examination with abnormal findings   [place] [facility] [place], Salvadore Oxford, NP   1 year ago Hyponatremia   [place] [facility] [place], Salvadore Oxford, NP       Future Appointments             In 1 week Sampson Si, Salvadore Oxford, NP [place] [facility], [facility]             atorvastatin (LIPITOR) 40 MG tablet [Pharmacy Med Name: ATORVASTATIN 40 MG TABLET] 90 tablet 1    Sig: TAKE 1 TABLET BY MOUTH EVERY DAY     Cardiovascular:  Antilipid - Statins Failed - 01/24/2023  8:48 AM      Failed - Lipid Panel in normal range within the last 12 months    Cholesterol  Date Value Ref Range Status  08/04/2022 101 <200 mg/dL Final   LDL Cholesterol (Calc)  Date Value Ref Range Status  08/04/2022 41 mg/dL (calc) Final    Comment:    Reference range: <100 . Desirable range <100 mg/dL for primary prevention;   <70 mg/dL for patients with CHD or diabetic patients  with > or = 2 CHD risk factors. Marland Kitchen LDL-C is now  calculated using the Martin-Hopkins  calculation, which is a validated novel method providing  better accuracy than the Friedewald equation in the  estimation of LDL-C.  Horald Pollen et al. Lenox Ahr. 1610;960(45): 2061-2068  (http://education.[facility].com/faq/FAQ164)    Direct LDL  Date Value Ref Range Status  12/13/2015 115.0 mg/dL Final    Comment:    Optimal:  <100 mg/dLNear or Above Optimal:  100-129 mg/dLBorderline High:  130-159 mg/dLHigh:  160-189 mg/dLVery High:  >190 mg/dL   HDL  Date Value Ref Range Status  08/04/2022 37 (L) > OR = 50 mg/dL Final   Triglycerides  Date Value Ref Range Status  08/04/2022 144 <150 mg/dL Final         Passed - Patient is not pregnant      Passed - Valid encounter within last 12 months    Recent Outpatient Visits           5 months ago Encounter for general adult medical examination with abnormal findings   [place] [facility] [place], Salvadore Oxford, NP   8 months ago Type 2 diabetes mellitus with hyperglycemia, without long-term current use  of insulin [facility])   [place] [facility] [place], [place] W, NP   1 year ago Type 2 diabetes mellitus with hyperglycemia, without long-term current use of insulin [facility])   [place] [facility] [place], Salvadore Oxford, NP   1 year ago Encounter for general adult medical examination with abnormal findings   [place] [facility] [place], Salvadore Oxford, NP   1 year ago Hyponatremia   [place] [facility] [place], Salvadore Oxford, NP       Future Appointments             In 1 week Sampson Si, Salvadore Oxford, NP [place] [facility], [facility]

## 2023-02-04 ENCOUNTER — Encounter: Payer: Self-pay | Admitting: Internal Medicine

## 2023-02-04 ENCOUNTER — Ambulatory Visit (INDEPENDENT_AMBULATORY_CARE_PROVIDER_SITE_OTHER): Payer: Medicare HMO | Admitting: Internal Medicine

## 2023-02-04 VITALS — BP 100/60 | Ht 65.0 in | Wt 165.2 lb

## 2023-02-04 DIAGNOSIS — M25561 Pain in right knee: Secondary | ICD-10-CM | POA: Diagnosis not present

## 2023-02-04 DIAGNOSIS — R42 Dizziness and giddiness: Secondary | ICD-10-CM | POA: Diagnosis not present

## 2023-02-04 DIAGNOSIS — M8588 Other specified disorders of bone density and structure, other site: Secondary | ICD-10-CM

## 2023-02-04 DIAGNOSIS — E1129 Type 2 diabetes mellitus with other diabetic kidney complication: Secondary | ICD-10-CM | POA: Diagnosis not present

## 2023-02-04 DIAGNOSIS — F419 Anxiety disorder, unspecified: Secondary | ICD-10-CM

## 2023-02-04 DIAGNOSIS — E1169 Type 2 diabetes mellitus with other specified complication: Secondary | ICD-10-CM | POA: Diagnosis not present

## 2023-02-04 DIAGNOSIS — G8929 Other chronic pain: Secondary | ICD-10-CM

## 2023-02-04 DIAGNOSIS — F32A Depression, unspecified: Secondary | ICD-10-CM

## 2023-02-04 DIAGNOSIS — E663 Overweight: Secondary | ICD-10-CM

## 2023-02-04 DIAGNOSIS — E1165 Type 2 diabetes mellitus with hyperglycemia: Secondary | ICD-10-CM | POA: Diagnosis not present

## 2023-02-04 DIAGNOSIS — R809 Proteinuria, unspecified: Secondary | ICD-10-CM

## 2023-02-04 DIAGNOSIS — E785 Hyperlipidemia, unspecified: Secondary | ICD-10-CM | POA: Diagnosis not present

## 2023-02-04 DIAGNOSIS — Z6827 Body mass index (BMI) 27.0-27.9, adult: Secondary | ICD-10-CM

## 2023-02-04 LAB — POCT GLYCOSYLATED HEMOGLOBIN (HGB A1C): Hemoglobin A1C: 6.8 % — AB (ref 4.0–5.6)

## 2023-02-04 MED ORDER — MECLIZINE HCL 25 MG PO TABS: 25.0000 mg | ORAL_TABLET | Freq: Three times a day (TID) | ORAL | 0 refills | Status: AC | PRN

## 2023-02-04 MED ORDER — METHOCARBAMOL 500 MG PO TABS: 500.0000 mg | ORAL_TABLET | Freq: Every day | ORAL | 0 refills | Status: DC | PRN

## 2023-02-04 NOTE — Assessment & Plan Note (Signed)
Stable on current dose of citalopram and buspirone Support offered

## 2023-02-04 NOTE — Assessment & Plan Note (Signed)
Continue lisinopril for renal protection 

## 2023-02-04 NOTE — Assessment & Plan Note (Signed)
Encourage regular physical activity Continue methocarbamol as needed

## 2023-02-04 NOTE — Assessment & Plan Note (Signed)
Continue Epley maneuver and meclizine as needed

## 2023-02-04 NOTE — Assessment & Plan Note (Signed)
Continue calcium and vitamin D Encourage daily weightbearing exercise 

## 2023-02-04 NOTE — Assessment & Plan Note (Signed)
Encourage diet and exercise for weight loss 

## 2023-02-04 NOTE — Progress Notes (Signed)
Subjective:    Patient ID: Maria Sloan, female    DOB: 09-15-1950, 72 y.o.   MRN: [id number]  HPI  Patient presents to clinic today for 75-month follow-up of chronic conditions.  HLD: Her last LDL was 41, triglycerides 875, 08/2022.  She denies myalgias on atorvastatin and ezetimibe.  She tries to consume low-fat diet.  DM2: Her last A1c was 6.4%, 08/2022.  She is taking metformin, glimepiride as prescribed.  She is on lisinopril for renal protection.  Her sugars range 133-186. She checks her feet as needed.  Her last eye exam was 08/2022.  Flu 11/2022.  Pneumovax 12/2019.  Prevnar 12/2017.  COVID Pfizer x 3.  Anxiety and depression: Chronic, managed on citalopram and buspirone. She is not currently seeing a therapist.  She denies SI/HI.  Osteopenia: She is taking calcium and vitamin d OTC.  She tries to get weightbearing exercise daily.  Bone density from 08/2021 reviewed.  OA: Mainly in back and her right knee.  She is taking methocarbamol with minimal relief of symptoms. She has had a steroid injection in her knee. She does follow with orthopedics.  Vertigo: Intermittent.  She typically manages this with the epley maneuver and takes meclizine as needed with good relief of symptoms.  Review of Systems     Past Medical History:  Diagnosis Date   Anxiety    Arthritis    Cervical cancer (HCC)    Hyperlipidemia    PVD (peripheral vascular disease) (HCC)    R leg venous insufficiency   PVD (peripheral vascular disease) (HCC)    Shingles    Type 2 diabetes mellitus (HCC)     Current Outpatient Medications  Medication Sig Dispense Refill   Accu-Chek Softclix Lancets lancets ONE TIME DAILY AS DIRECTED. FOR ICD-10 E10.9, E11.9 A1C 02/11/2018- 11.8% 100 each 12   atorvastatin (LIPITOR) 40 MG tablet Take 1 tablet (40 mg total) by mouth daily. 90 tablet 1   Blood Glucose Monitoring Suppl (ACCU-CHEK AVIVA PLUS) w/Device KIT 1 Device by Does not apply route as directed. . One time daily as  directed. FOR ICD-10 E10.9, E11.9 A1C 02/11/2018- 11.8% 1 kit 0   busPIRone (BUSPAR) 5 MG tablet TAKE 1 TABLET BY MOUTH EVERYDAY AT BEDTIME 90 tablet 1   CALCIUM CITRATE PO Take 1 tablet by mouth daily.      Cholecalciferol (VITAMIN D PO) Take 1 tablet by mouth daily.      citalopram (CELEXA) 20 MG tablet Take 1 tablet (20 mg total) by mouth daily. 90 tablet 1   ezetimibe (ZETIA) 10 MG tablet TAKE 1 TABLET BY MOUTH EVERY DAY 90 tablet 1   fluticasone (FLONASE) 50 MCG/ACT nasal spray PLACE 2 SPRAYS INTO BOTH NOSTRILS DAILY. USE FOR 4-6 WEEKS THEN STOP AND USE AS NEEDED 48 mL 0   glimepiride (AMARYL) 2 MG tablet TAKE 1 TABLET BY MOUTH EVERY DAY WITH BREAKFAST 90 tablet 1   glucose blood (ACCU-CHEK AVIVA PLUS) test strip USE UP TO ONE TIME AS DIRECTED 100 strip 2   lisinopril (ZESTRIL) 5 MG tablet TAKE 1 TABLET (5 MG TOTAL) BY MOUTH DAILY. 90 tablet 0   metFORMIN (GLUCOPHAGE) 1000 MG tablet Take 1 tablet (1,000 mg total) by mouth 2 (two) times daily with a meal. 180 tablet 0   Multiple Vitamin (MULTIVITAMIN) tablet Take 1 tablet by mouth daily.     Omega-3 Fatty Acids (FISH OIL) 1000 MG CAPS Take 1,000 mg by mouth 3 (three) times daily.  Potassium 75 MG TABS Take 1 tablet by mouth daily.     sitaGLIPtin (JANUVIA) 50 MG tablet Take 1 tablet (50 mg total) by mouth daily. (Patient not taking: Reported on 10/12/2022) 90 tablet 1   No current facility-administered medications for this visit.    Allergies  Allergen Reactions   Prochlorperazine Other (See Comments)    Compazine   Other Hives    Other reaction(s): Dizziness   Triprolidine-Pseudoephedrine Other (See Comments)   Actifed Cold-Allergy [Chlorpheniramine-Phenylephrine]     Ok with Robitussin, Mucinex   Penicillins     Family History  Problem Relation Age of Onset   Stroke Mother    Emphysema Father    Lung cancer Father    Hyperlipidemia Father    Hyperlipidemia Sister    Healthy Son    Colon cancer Neg Hx    Breast cancer  Neg Hx    Ovarian cancer Neg Hx     Social History   Socioeconomic History   Marital status: Divorced    Spouse name: Not on file   Number of children: Not on file   Years of education: Not on file   Highest education level: Bachelor's degree (e.g., BA, AB, BS)  Occupational History   Occupation: retired   Tobacco Use   Smoking status: Former    Current packs/day: 0.00    Average packs/day: 1 pack/day for 25.0 years (25.0 ttl pk-yrs)    Types: Cigarettes    Start date: 11/28/1969    Quit date: 11/29/1994    Years since quitting: 28.2   Smokeless tobacco: Never  Vaping Use   Vaping status: Never Used  Substance and Sexual Activity   Alcohol use: Yes    Alcohol/week: 0.0 standard drinks of alcohol    Comment: occasionally   Drug use: Never   Sexual activity: Not Currently    Birth control/protection: None  Other Topics Concern   Not on file  Social History Narrative   Divorced.   1 son.    Moved from [place].   Enjoys making jewelry, spending time with family, crafts, reading.   Social Determinants of Health   Financial Resource Strain: Low Risk  (08/07/2022)   Overall Financial Resource Strain (CARDIA)    Difficulty of Paying Living Expenses: Not very hard  Food Insecurity: No Food Insecurity (08/07/2022)   Hunger Vital Sign    Worried About Running Out of Food in the Last Year: Never true    Ran Out of Food in the Last Year: Never true  Transportation Needs: No Transportation Needs (08/07/2022)   PRAPARE - Administrator, Civil Service (Medical): No    Lack of Transportation (Non-Medical): No  Physical Activity: Sufficiently Active (08/07/2022)   Exercise Vital Sign    Days of Exercise per Week: 4 days    Minutes of Exercise per Session: 50 min  Stress: No Stress Concern Present (08/07/2022)   [employer] of Occupational Health - Occupational Stress Questionnaire    Feeling of Stress : Only a little  Recent Concern: Stress - Stress Concern Present  (07/31/2022)   [employer] of Occupational Health - Occupational Stress Questionnaire    Feeling of Stress : To some extent  Social Connections: Socially Isolated (08/07/2022)   Social Connection and Isolation Panel [NHANES]    Frequency of Communication with Friends and Family: More than three times a week    Frequency of Social Gatherings with Friends and Family: Once a week    Attends Religious  Services: Never    Active Member of Clubs or Organizations: No    Attends Banker Meetings: Never    Marital Status: Divorced  Catering manager Violence: Not At Risk (08/07/2022)   Humiliation, Afraid, Rape, and Kick questionnaire    Fear of Current or Ex-Partner: No    Emotionally Abused: No    Physically Abused: No    Sexually Abused: No     Constitutional: Denies fever, malaise, fatigue, headache or abrupt weight changes.  HEENT: Denies eye pain, eye redness, ear pain, ringing in the ears, wax buildup, runny nose, nasal congestion, bloody nose, or sore throat. Respiratory: Denies difficulty breathing, shortness of breath, cough or sputum production.   Cardiovascular: Denies chest pain, chest tightness, palpitations or swelling in the hands or feet.  Gastrointestinal: Denies abdominal pain, bloating, constipation, diarrhea or blood in the stool.  GU: Denies urgency, frequency, pain with urination, burning sensation, blood in urine, or discharge. Musculoskeletal: Patient reports knee pain.  Denies decrease in range of motion, difficulty with gait, muscle pain or joint pain and swelling.  Skin: Denies redness, rashes, lesions or ulcercations.  Neurological: Patient reports intermittent dizziness.  Denies difficulty with memory, difficulty with speech or problems with balance and coordination.  Psych: Patient has a history of anxiety and depression.  Denies SI/HI.  No other specific complaints in a complete review of systems (except as listed in HPI above).  Objective:    Physical Exam BP 100/60   Ht 5\' 5"  (1.651 m)   Wt 165 lb 3.2 oz (74.9 kg)   BMI 27.49 kg/m    Wt Readings from Last 3 Encounters:  10/12/22 164 lb (74.4 kg)  08/07/22 168 lb 9.6 oz (76.5 kg)  08/04/22 167 lb (75.8 kg)    General: Appears her stated age, overweight, in NAD. Skin: Warm, dry and intact. No ulcerations noted. HEENT: Head: normal shape and size; Eyes: sclera white, no icterus, conjunctiva pink, PERRLA and EOMs intact;  Cardiovascular: Normal rate and rhythm. S1,S2 noted.  No murmur, rubs or gallops noted. No JVD or BLE edema. No carotid bruits noted. Pulmonary/Chest: Normal effort and positive vesicular breath sounds. No respiratory distress. No wheezes, rales or ronchi noted.  Musculoskeletal: Joint enlargement noted of right knee. Strength 5/5 BLE.  No difficulty with gait.  Neurological: Alert and oriented. Coordination normal.  Psychiatric: Mood and affect normal. Mildly anxious appearing. Judgment and thought content normal.     BMET    Component Value Date/Time   NA 143 08/04/2022 0928   K 4.6 08/04/2022 0928   CL 107 08/04/2022 0928   CO2 27 08/04/2022 0928   GLUCOSE 124 (H) 08/04/2022 0928   BUN 14 08/04/2022 0928   CREATININE 0.59 (L) 08/04/2022 0928   CALCIUM 9.6 08/04/2022 0928   GFRNONAA >60 03/06/2021 0933   GFRNONAA 94 02/11/2018 0928   GFRAA 109 02/11/2018 0928    Lipid Panel     Component Value Date/Time   CHOL 101 08/04/2022 0928   TRIG 144 08/04/2022 0928   HDL 37 (L) 08/04/2022 0928   CHOLHDL 2.7 08/04/2022 0928   LDLCALC 41 08/04/2022 0928    CBC    Component Value Date/Time   WBC 7.8 08/04/2022 0928   RBC 4.49 08/04/2022 0928   HGB 13.5 08/04/2022 0928   HCT 40.4 08/04/2022 0928   PLT 247 08/04/2022 0928   MCV 90.0 08/04/2022 0928   MCH 30.1 08/04/2022 0928   MCHC 33.4 08/04/2022 0928   RDW 12.6  08/04/2022 0928   LYMPHSABS 1.3 03/06/2021 0933   MONOABS 1.0 03/06/2021 0933   EOSABS 0.0 03/06/2021 0933   BASOSABS 0.0  03/06/2021 0933    Hgb A1C Lab Results  Component Value Date   HGBA1C 6.4 (H) 08/04/2022            Assessment & Plan:    RTC in 6 months for annual exam Nicki Reaper, NP

## 2023-02-04 NOTE — Assessment & Plan Note (Signed)
C-Met and lipid profile today Encouraged to consume low-fat diet Continue atorvastatin and ezetimibe

## 2023-02-04 NOTE — Patient Instructions (Signed)

## 2023-02-04 NOTE — Assessment & Plan Note (Signed)
POCT A1c 6.8 % Urine microalbumin has been checked within the last year Encouraged her to consume a low-carb diet and exercise for weight loss Continue metformin and glimepiride Encourage routine eye exam Encouraged routine foot exam Immunizations UTD

## 2023-02-05 ENCOUNTER — Encounter: Payer: Self-pay | Admitting: Internal Medicine

## 2023-02-05 ENCOUNTER — Other Ambulatory Visit: Payer: Self-pay

## 2023-02-05 LAB — LIPID PANEL
Cholesterol: 114 mg/dL (ref <200)
HDL: 40 mg/dL — ABNORMAL LOW (ref >=50)
LDL Cholesterol (Calc): 47 mg/dL
Non-HDL Cholesterol (Calc): 74 mg/dL (ref <130)
Total CHOL/HDL Ratio: 2.9 (calc) (ref <5.0)
Triglycerides: 199 mg/dL — ABNORMAL HIGH (ref <150)

## 2023-02-05 LAB — COMPLETE METABOLIC PANEL WITH GFR
AG Ratio: 1.6 (calc) (ref 1.0–2.5)
ALT: 22 U/L (ref 6–29)
AST: 20 U/L (ref 10–35)
Albumin: 4.2 g/dL (ref 3.6–5.1)
Alkaline phosphatase (APISO): 64 U/L (ref 37–153)
BUN: 15 mg/dL (ref 7–25)
CO2: 32 mmol/L (ref 20–32)
Calcium: 9.4 mg/dL (ref 8.6–10.4)
Chloride: 103 mmol/L (ref 98–110)
Creat: 0.63 mg/dL (ref 0.60–1.00)
Globulin: 2.7 g/dL (ref 1.9–3.7)
Glucose, Bld: 148 mg/dL — ABNORMAL HIGH (ref 65–99)
Potassium: 4.8 mmol/L (ref 3.5–5.3)
Sodium: 142 mmol/L (ref 135–146)
Total Bilirubin: 0.5 mg/dL (ref 0.2–1.2)
Total Protein: 6.9 g/dL (ref 6.1–8.1)
eGFR: 94 mL/min/{1.73_m2} (ref >=60)

## 2023-02-05 LAB — CBC
HCT: 41.4 % (ref 35.0–45.0)
Hemoglobin: 13.8 g/dL (ref 11.7–15.5)
MCH: 30 pg (ref 27.0–33.0)
MCHC: 33.3 g/dL (ref 32.0–36.0)
MCV: 90 fL (ref 80.0–100.0)
MPV: 11.7 fL (ref 7.5–12.5)
Platelets: 269 10*3/uL (ref 140–400)
RBC: 4.6 10*6/uL (ref 3.80–5.10)
RDW: 12.3 % (ref 11.0–15.0)
WBC: 6.9 10*3/uL (ref 3.8–10.8)

## 2023-02-05 MED ORDER — ATORVASTATIN CALCIUM 80 MG PO TABS: 80.0000 mg | ORAL_TABLET | Freq: Every day | ORAL | 3 refills | Status: DC

## 2023-02-05 NOTE — Addendum Note (Signed)
Addended by: Luanna Cole D on: 02/05/2023 10:57 AM   Modules accepted: Orders

## 2023-02-14 ENCOUNTER — Other Ambulatory Visit: Payer: Self-pay | Admitting: Internal Medicine

## 2023-02-14 DIAGNOSIS — E1129 Type 2 diabetes mellitus with other diabetic kidney complication: Secondary | ICD-10-CM

## 2023-02-14 DIAGNOSIS — E1165 Type 2 diabetes mellitus with hyperglycemia: Secondary | ICD-10-CM

## 2023-02-15 NOTE — Telephone Encounter (Signed)
Requested by interface surescripts. Future visit in 5 months.  Requested Prescriptions  Pending Prescriptions Disp Refills   lisinopril (ZESTRIL) 5 MG tablet [Pharmacy Med Name: LISINOPRIL 5 MG TABLET] 90 tablet 0    Sig: TAKE 1 TABLET (5 MG TOTAL) BY MOUTH DAILY.     Cardiovascular:  ACE Inhibitors Passed - 02/15/2023  4:18 PM      Passed - Cr in normal range and within 180 days    Creat  Date Value Ref Range Status  02/04/2023 0.63 0.60 - 1.00 mg/dL Final   Creatinine,U  Date Value Ref Range Status  12/13/2015 153.6 mg/dL Final   Creatinine, Urine  Date Value Ref Range Status  08/04/2022 104 20 - 275 mg/dL Final         Passed - K in normal range and within 180 days    Potassium  Date Value Ref Range Status  02/04/2023 4.8 3.5 - 5.3 mmol/L Final         Passed - Patient is not pregnant      Passed - Last BP in normal range    BP Readings from Last 1 Encounters:  02/04/23 100/60         Passed - Valid encounter within last 6 months    Recent Outpatient Visits           1 week ago Type 2 diabetes mellitus with hyperglycemia, without long-term current use of insulin [facility])   [place] [facility] [place], Salvadore Oxford, NP   6 months ago Encounter for general adult medical examination with abnormal findings   [place] [facility] [place], [place] W, NP   9 months ago Type 2 diabetes mellitus with hyperglycemia, without long-term current use of insulin [facility])   [place] [facility] [place], [place] W, NP   1 year ago Type 2 diabetes mellitus with hyperglycemia, without long-term current use of insulin [facility])   [place] [facility] [place], Salvadore Oxford, NP   1 year ago Encounter for general adult medical examination with abnormal findings   [place] [facility] [place], Salvadore Oxford, NP       Future Appointments             In 5 months [name], Salvadore Oxford, NP [facility], [facility]

## 2023-02-26 ENCOUNTER — Other Ambulatory Visit: Payer: Self-pay | Admitting: Internal Medicine

## 2023-02-26 DIAGNOSIS — F419 Anxiety disorder, unspecified: Secondary | ICD-10-CM

## 2023-02-26 DIAGNOSIS — E1129 Type 2 diabetes mellitus with other diabetic kidney complication: Secondary | ICD-10-CM

## 2023-02-26 DIAGNOSIS — E1165 Type 2 diabetes mellitus with hyperglycemia: Secondary | ICD-10-CM

## 2023-03-03 ENCOUNTER — Encounter: Payer: Self-pay | Admitting: Internal Medicine

## 2023-03-03 NOTE — Telephone Encounter (Signed)
 Requested Prescriptions  Pending Prescriptions Disp Refills   atorvastatin  (LIPITOR) 40 MG tablet [Pharmacy Med Name: ATORVASTATIN  40 MG TABLET] 90 tablet     Sig: TAKE 1 TABLET BY MOUTH EVERY DAY     Cardiovascular:  Antilipid - Statins Failed - 03/03/2023 10:58 AM      Failed - Lipid Panel in normal range within the last 12 months    Cholesterol  Date Value Ref Range Status  02/04/2023 114 <200 mg/dL Final   LDL Cholesterol (Calc)  Date Value Ref Range Status  02/04/2023 47 mg/dL (calc) Final    Comment:    Reference range: <100 . Desirable range <100 mg/dL for primary prevention;   <70 mg/dL for patients with CHD or diabetic patients  with > or = 2 CHD risk factors. SABRA LDL-C is now calculated using the Martin-Hopkins  calculation, which is a validated novel method providing  better accuracy than the Friedewald equation in the  estimation of LDL-C.  Maria Sloan et al. SANDREA. 7986;689(80): 2061-2068  (http://education.[facility].com/faq/FAQ164)    Direct LDL  Date Value Ref Range Status  12/13/2015 115.0 mg/dL Final    Comment:    Optimal:  <100 mg/dLNear or Above Optimal:  100-129 mg/dLBorderline High:  130-159 mg/dLHigh:  160-189 mg/dLVery High:  >190 mg/dL   HDL  Date Value Ref Range Status  02/04/2023 40 (L) > OR = 50 mg/dL Final   Triglycerides  Date Value Ref Range Status  02/04/2023 199 (H) <150 mg/dL Final         Passed - Patient is not pregnant      Passed - Valid encounter within last 12 months    Recent Outpatient Visits           3 weeks ago Type 2 diabetes mellitus with hyperglycemia, without long-term current use of insulin (HCC)   [place] [facility] [place], [name], NP   7 months ago Encounter for general adult medical examination with abnormal findings   [place] [facility] [place], [place] W, NP   10 months ago Type 2 diabetes mellitus with hyperglycemia, without long-term current use of insulin [facility])    [place] [facility] [place], [place] W, NP   1 year ago Type 2 diabetes mellitus with hyperglycemia, without long-term current use of insulin [facility])   [place] [facility] [place], [name], NP   1 year ago Encounter for general adult medical examination with abnormal findings   [place] [facility] [place], [name], NP       Future Appointments             In 5 months [place], [name], NP [place] [facility], [facility]             citalopram  (CELEXA ) 20 MG tablet [Pharmacy Med Name: CITALOPRAM  HBR 20 MG TABLET] 90 tablet 1    Sig: TAKE 1 TABLET BY MOUTH EVERY DAY     Psychiatry:  Antidepressants - SSRI Passed - 03/03/2023 10:58 AM      Passed - Completed PHQ-2 or PHQ-9 in the last 360 days      Passed - Valid encounter within last 6 months    Recent Outpatient Visits           3 weeks ago Type 2 diabetes mellitus with hyperglycemia, without long-term current use of insulin [facility])   [name] [place], [name], [place]  7 months ago Encounter for general adult medical examination with abnormal findings   [name] [place], [place] W, NP   10 months ago Type 2 diabetes mellitus with hyperglycemia, without long-term current use of insulin [facility])   [name] [place], [place] W, NP   1 year ago Type 2 diabetes mellitus with hyperglycemia, without long-term current use of insulin [facility])   [place] [facility] [place], [name], NP   1 year ago Encounter for general adult medical examination with abnormal findings   [place] [facility] [place], [name], NP       Future Appointments             In 5 months [name], [name], NP [place] [facility], [facility]             lisinopril  (ZESTRIL ) 5 MG tablet [Pharmacy Med Name: LISINOPRIL  5 MG TABLET] 90 tablet     Sig: TAKE 1 TABLET (5 MG  TOTAL) BY MOUTH DAILY.     Cardiovascular:  ACE Inhibitors Passed - 03/03/2023 10:58 AM      Passed - Cr in normal range and within 180 days    Creat  Date Value Ref Range Status  02/04/2023 0.63 0.60 - 1.00 mg/dL Final   Creatinine,U  Date Value Ref Range Status  12/13/2015 153.6 mg/dL Final   Creatinine, Urine  Date Value Ref Range Status  08/04/2022 104 20 - 275 mg/dL Final         Passed - K in normal range and within 180 days    Potassium  Date Value Ref Range Status  02/04/2023 4.8 3.5 - 5.3 mmol/L Final         Passed - Patient is not pregnant      Passed - Last BP in normal range    BP Readings from Last 1 Encounters:  02/04/23 100/60         Passed - Valid encounter within last 6 months    Recent Outpatient Visits           3 weeks ago Type 2 diabetes mellitus with hyperglycemia, without long-term current use of insulin [facility])   [place] [facility] [place], [name], NP   7 months ago Encounter for general adult medical examination with abnormal findings   [place] [facility] [place], [place] W, NP   10 months ago Type 2 diabetes mellitus with hyperglycemia, without long-term current use of insulin [name])   [place] [facility] [place], [place] W, NP   1 year ago Type 2 diabetes mellitus with hyperglycemia, without long-term current use of insulin [facility])   [place] [facility] [place], [name], NP   1 year ago Encounter for general adult medical examination with abnormal findings   [place] [facility] [place], [name], NP       Future Appointments             In 5 months [name], [name], NP  [facility], [facility]

## 2023-04-26 ENCOUNTER — Other Ambulatory Visit: Payer: Self-pay | Admitting: Internal Medicine

## 2023-04-26 DIAGNOSIS — E782 Mixed hyperlipidemia: Secondary | ICD-10-CM

## 2023-04-27 NOTE — Telephone Encounter (Signed)
 Requested by interface surescripts. Future visit in 3 months.  Last labs 02/04/23.  Requested Prescriptions  Pending Prescriptions Disp Refills   ezetimibe (ZETIA) 10 MG tablet [Pharmacy Med Name: EZETIMIBE 10 MG TABLET] 90 tablet 1    Sig: TAKE 1 TABLET BY MOUTH EVERY DAY     Cardiovascular:  Antilipid - Sterol Transport Inhibitors Failed - 04/27/2023 10:43 AM      Failed - Lipid Panel in normal range within the last 12 months    Cholesterol  Date Value Ref Range Status  02/04/2023 114 <200 mg/dL Final   LDL Cholesterol (Calc)  Date Value Ref Range Status  02/04/2023 47 mg/dL (calc) Final    Comment:    Reference range: <100 . Desirable range <100 mg/dL for primary prevention;   <70 mg/dL for patients with CHD or diabetic patients  with > or = 2 CHD risk factors. Mar[place] Kitchen LDL-C is now calculated using the Martin-Hopkins  calculation, which is a validated novel method providing  better accuracy than the Friedewald equation in the  estimation of LDL-C.  Horald Pollen et al. [place]ox Ahr. 1191;478(29): 2061-2068  (http://education.[facility].com/faq/FAQ164)    Direct LDL  Date Value Ref Range Status  12/13/2015 115.0 mg/dL Final    Comment:    Optimal:  <100 mg/dLNear or Above Optimal:  100-129 mg/dLBorderline High:  130-159 mg/dLHigh:  160-189 mg/dLVery High:  >190 mg/dL   HDL  Date Value Ref Range Status  02/04/2023 40 (L) > OR = 50 mg/dL Final   Triglycerides  Date Value Ref Range Status  02/04/2023 199 (H) <150 mg/dL Final         Passed - AST in normal range and within 360 days    AST  Date Value Ref Range Status  02/04/2023 20 10 - 35 U/L Final         Passed - ALT in normal range and within 360 days    ALT  Date Value Ref Range Status  02/04/2023 22 6 - 29 U/L Final         Passed - Patient is not pregnant      Passed - Valid encounter within last 12 months    Recent Outpatient Visits           2 months ago Type 2 diabetes mellitus with hyperglycemia, without  long-term current use of insulin [facility])   [place] [facility] [place], Salvadore Oxford, NP   8 months ago Encounter for general adult medical examination with abnormal findings   [place] [facility] [place], [place] W, NP   11 months ago Type 2 diabetes mellitus with hyperglycemia, without long-term current use of insulin [facility])   [place] [facility] [place], [place] W, NP   1 year ago Type 2 diabetes mellitus with hyperglycemia, without long-term current use of insulin [facility])   [place] [facility] [place], Salvadore Oxford, NP   1 year ago Encounter for general adult medical examination with abnormal findings   [place] [place], Salvadore Oxford, NP       Future Appointments             In 3 months [name], Salvadore Oxford, NP [place] [facility] [place], [facility]

## 2023-07-24 ENCOUNTER — Other Ambulatory Visit: Payer: Self-pay | Admitting: Internal Medicine

## 2023-07-24 DIAGNOSIS — F419 Anxiety disorder, unspecified: Secondary | ICD-10-CM

## 2023-07-24 DIAGNOSIS — F32A Depression, unspecified: Secondary | ICD-10-CM

## 2023-07-28 NOTE — Telephone Encounter (Signed)
 Requested Prescriptions  Pending Prescriptions Disp Refills   glimepiride  (AMARYL ) 2 MG tablet [Pharmacy Med Name: GLIMEPIRIDE  2 MG TABLET] 90 tablet 0    Sig: TAKE 1 TABLET BY MOUTH EVERY DAY WITH BREAKFAST     Endocrinology:  Diabetes - Sulfonylureas Failed - 07/28/2023  9:08 AM      Failed - Valid encounter within last 6 months    Recent Outpatient Visits   None     Future Appointments             In 1 week [name], Rankin Buzzard, NP [place] [facility]            Passed - HBA1C is between 0 and 7.9 and within 180 days    Hemoglobin A1C  Date Value Ref Range Status  02/04/2023 6.8 (A) 4.0 - 5.6 % Final   Hgb A1c MFr Bld  Date Value Ref Range Status  08/04/2022 6.4 (H) <5.7 % of total Hgb Final    Comment:    For someone without known diabetes, a hemoglobin  A1c value between 5.7% and 6.4% is consistent with prediabetes and should be confirmed with a  follow-up test. . For someone with known diabetes, a value <7% indicates that their diabetes is well controlled. A1c targets should be individualized based on duration of diabetes, age, comorbid conditions, and other considerations. . This assay result is consistent with an increased risk of diabetes. . Currently, no consensus exists regarding use of hemoglobin A1c for diagnosis of diabetes for children. .          Passed - Cr in normal range and within 360 days    Creat  Date Value Ref Range Status  02/04/2023 0.63 0.60 - 1.00 mg/dL Final   Creatinine,U  Date Value Ref Range Status  12/13/2015 153.6 mg/dL Final   Creatinine, Urine  Date Value Ref Range Status  08/04/2022 104 20 - 275 mg/dL Final          busPIRone  (BUSPAR ) 5 MG tablet [Pharmacy Med Name: BUSPIRONE  HCL 5 MG TABLET] 90 tablet 0    Sig: TAKE 1 TABLET BY MOUTH EVERYDAY AT BEDTIME     Psychiatry: Anxiolytics/Hypnotics - Non-controlled Failed - 07/28/2023  9:08 AM      Failed - Valid encounter within last 12 months     Recent Outpatient Visits   None     Future Appointments             In 1 week [name], Rankin Buzzard, NP [place] [facility], [facility]

## 2023-08-05 ENCOUNTER — Ambulatory Visit (INDEPENDENT_AMBULATORY_CARE_PROVIDER_SITE_OTHER): Payer: Self-pay | Admitting: Internal Medicine

## 2023-08-05 ENCOUNTER — Encounter: Payer: Self-pay | Admitting: Internal Medicine

## 2023-08-05 VITALS — BP 112/68 | Ht 65.0 in | Wt 165.2 lb

## 2023-08-05 DIAGNOSIS — R809 Proteinuria, unspecified: Secondary | ICD-10-CM

## 2023-08-05 DIAGNOSIS — Z1231 Encounter for screening mammogram for malignant neoplasm of breast: Secondary | ICD-10-CM

## 2023-08-05 DIAGNOSIS — E1165 Type 2 diabetes mellitus with hyperglycemia: Secondary | ICD-10-CM | POA: Diagnosis not present

## 2023-08-05 DIAGNOSIS — E663 Overweight: Secondary | ICD-10-CM

## 2023-08-05 DIAGNOSIS — Z6827 Body mass index (BMI) 27.0-27.9, adult: Secondary | ICD-10-CM

## 2023-08-05 DIAGNOSIS — E1129 Type 2 diabetes mellitus with other diabetic kidney complication: Secondary | ICD-10-CM

## 2023-08-05 DIAGNOSIS — Z7984 Long term (current) use of oral hypoglycemic drugs: Secondary | ICD-10-CM | POA: Diagnosis not present

## 2023-08-05 DIAGNOSIS — Z0001 Encounter for general adult medical examination with abnormal findings: Secondary | ICD-10-CM | POA: Diagnosis not present

## 2023-08-05 NOTE — Progress Notes (Signed)
 Subjective:    Patient ID: Maria Sloan, female    DOB: 12/23/1950, 73 y.o.   MRN: [id number]  HPI  Patient presents to clinic today for her annual exam.  Flu: 11/2022 Tetanus: 08/2019 COVID: Pfizer x 3 Pneumovax: 08/2019 Prevnar: 12/2017 Shingrix: 05/2021, 08/2021 Pap smear: Hysterectomy Mammogram: 08/2022 Bone density: 08/2021 Colon screening: 10/2022, 5 year recall Vision screening: annually Dentist: as needed  Diet: She does eat meat. She consumes fruits and veggies. She tries to avoid fried foods. She drinks mostly Dt. Dr. Kathlene Paradise, tea, water and coffee. Exercise: Walking  Review of Systems  Past Medical History:  Diagnosis Date   Anxiety    Arthritis    Cervical cancer (HCC)    Hyperlipidemia    PVD (peripheral vascular disease) (HCC)    R leg venous insufficiency   PVD (peripheral vascular disease) (HCC)    Shingles    Type 2 diabetes mellitus (HCC)     Current Outpatient Medications  Medication Sig Dispense Refill   Accu-Chek Softclix Lancets lancets ONE TIME DAILY AS DIRECTED. FOR ICD-10 E10.9, E11.9 A1C 02/11/2018- 11.8% 100 each 12   atorvastatin  (LIPITOR) 80 MG tablet Take 1 tablet (80 mg total) by mouth daily. 90 tablet 3   Blood Glucose Monitoring Suppl (ACCU-CHEK AVIVA PLUS) w/Device KIT 1 Device by Does not apply route as directed. . One time daily as directed. FOR ICD-10 E10.9, E11.9 A1C 02/11/2018- 11.8% 1 kit 0   busPIRone  (BUSPAR ) 5 MG tablet TAKE 1 TABLET BY MOUTH EVERYDAY AT BEDTIME 90 tablet 0   CALCIUM  CITRATE PO Take 1 tablet by mouth daily.      Cholecalciferol (VITAMIN D  PO) Take 1 tablet by mouth daily.      citalopram  (CELEXA ) 20 MG tablet TAKE 1 TABLET BY MOUTH EVERY DAY 90 tablet 1   ezetimibe  (ZETIA ) 10 MG tablet TAKE 1 TABLET BY MOUTH EVERY DAY 90 tablet 1   fluticasone  (FLONASE ) 50 MCG/ACT nasal spray PLACE 2 SPRAYS INTO BOTH NOSTRILS DAILY. USE FOR 4-6 WEEKS THEN STOP AND USE AS NEEDED 48 mL 0   glimepiride  (AMARYL ) 2 MG tablet TAKE 1  TABLET BY MOUTH EVERY DAY WITH BREAKFAST 90 tablet 0   glucose blood (ACCU-CHEK AVIVA PLUS) test strip USE UP TO ONE TIME AS DIRECTED 100 strip 2   lisinopril  (ZESTRIL ) 5 MG tablet TAKE 1 TABLET (5 MG TOTAL) BY MOUTH DAILY. 90 tablet 0   meclizine  (ANTIVERT ) 25 MG tablet Take 1 tablet (25 mg total) by mouth 3 (three) times daily as needed for dizziness. 30 tablet 0   metFORMIN  (GLUCOPHAGE ) 1000 MG tablet Take 1 tablet (1,000 mg total) by mouth 2 (two) times daily with a meal. 180 tablet 0   methocarbamol  (ROBAXIN ) 500 MG tablet Take 1 tablet (500 mg total) by mouth daily as needed. 30 tablet 0   Multiple Vitamin (MULTIVITAMIN) tablet Take 1 tablet by mouth daily.     Omega-3 Fatty Acids (FISH OIL) 1000 MG CAPS Take 1,000 mg by mouth 3 (three) times daily.     Potassium 75 MG TABS Take 1 tablet by mouth daily.     No current facility-administered medications for this visit.    Allergies  Allergen Reactions   Prochlorperazine Other (See Comments)    Compazine   Other Hives    Other reaction(s): Dizziness   Triprolidine-Pseudoephedrine Other (See Comments)   Actifed Cold-Allergy [Chlorpheniramine-Phenylephrine ]     Ok with Robitussin, Mucinex   Penicillins     Family History  Problem Relation Age of Onset   Stroke Mother    Emphysema Father    Lung cancer Father    Hyperlipidemia Father    Hyperlipidemia Sister    Healthy Son    Colon cancer Neg Hx    Breast cancer Neg Hx    Ovarian cancer Neg Hx     Social History   Socioeconomic History   Marital status: Divorced    Spouse name: Not on file   Number of children: Not on file   Years of education: Not on file   Highest education level: Bachelor's degree (e.g., BA, AB, BS)  Occupational History   Occupation: retired   Tobacco Use   Smoking status: Former    Current packs/day: 0.00    Average packs/day: 1 pack/day for 25.0 years (25.0 ttl pk-yrs)    Types: Cigarettes    Start date: 11/28/1969    Quit date: 11/29/1994     Years since quitting: 28.7   Smokeless tobacco: Never  Vaping Use   Vaping status: Never Used  Substance and Sexual Activity   Alcohol use: Yes    Alcohol/week: 0.0 standard drinks of alcohol    Comment: occasionally   Drug use: Never   Sexual activity: Not Currently    Birth control/protection: None  Other Topics Concern   Not on file  Social History Narrative   Divorced.   1 son.    Moved from [place] .   Enjoys Nutritional therapist, spending time with family, crafts, reading.   Social Drivers of Corporate investment banker Strain: Low Risk  (08/07/2022)   Overall Financial Resource Strain (CARDIA)    Difficulty of Paying Living Expenses: Not very hard  Food Insecurity: No Food Insecurity (08/07/2022)   Hunger Vital Sign    Worried About Running Out of Food in the Last Year: Never true    Ran Out of Food in the Last Year: Never true  Transportation Needs: No Transportation Needs (08/07/2022)   PRAPARE - Administrator, Civil Service (Medical): No    Lack of Transportation (Non-Medical): No  Physical Activity: Sufficiently Active (08/07/2022)   Exercise Vital Sign    Days of Exercise per Week: 4 days    Minutes of Exercise per Session: 50 min  Stress: No Stress Concern Present (02/04/2023)   [employer] of Occupational Health - Occupational Stress Questionnaire    Feeling of Stress : Only a little  Social Connections: Socially Isolated (02/04/2023)   Social Connection and Isolation Panel [NHANES]    Frequency of Communication with Friends and Family: More than three times a week    Frequency of Social Gatherings with Friends and Family: Three times a week    Attends Religious Services: Never    Active Member of Clubs or Organizations: No    Attends Banker Meetings: Never    Marital Status: Divorced  Catering manager Violence: Not At Risk (08/07/2022)   Humiliation, Afraid, Rape, and Kick questionnaire    Fear of Current or Ex-Partner: No    Emotionally  Abused: No    Physically Abused: No    Sexually Abused: No     Constitutional: Denies fever, malaise, fatigue, headache or abrupt weight changes.  HEENT: Denies eye pain, eye redness, ear pain, ringing in the ears, wax buildup, runny nose, nasal congestion, bloody nose, or sore throat. Respiratory: Denies difficulty breathing, shortness of breath, cough or sputum production.   Cardiovascular: Denies chest pain, chest tightness, palpitations or swelling in  the hands or feet.  Gastrointestinal: Denies abdominal pain, bloating, constipation, diarrhea or blood in the stool.  GU: Denies urgency, frequency, pain with urination, burning sensation, blood in urine, odor or discharge. Musculoskeletal: Patient reports intermittent knee swelling.  Denies decrease in range of motion, difficulty with gait, muscle pain or joint pain.  Skin: Denies redness, rashes, lesions or ulcercations.  Neurological: Patient reports intermittent dizziness.  Denies difficulty with memory, difficulty with speech or problems with balance and coordination.  Psych: Patient has a history of anxiety and depression.  Denies SI/HI.  No other specific complaints in a complete review of systems (except as listed in HPI above).     Objective:   Physical Exam BP 112/68 (BP Location: Left Arm, Patient Position: Sitting, Cuff Size: Normal)   Ht 5\' 5"  (1.651 m)   Wt 165 lb 3.2 oz (74.9 kg)   BMI 27.49 kg/m    Wt Readings from Last 3 Encounters:  02/04/23 165 lb 3.2 oz (74.9 kg)  10/12/22 164 lb (74.4 kg)  08/07/22 168 lb 9.6 oz (76.5 kg)    General: Appears her stated age, overweight, in NAD. Skin: Warm, dry and intact. No ulcerations noted. HEENT: Head: normal shape and size; Eyes: sclera white, no icterus, conjunctiva pink, PERRLA and EOMs intact;  Neck:  Neck supple, trachea midline. No masses, lumps or thyromegaly present.  Cardiovascular: Normal rate and rhythm. S1,S2 noted.  No murmur, rubs or gallops noted. No JVD  or BLE edema. No carotid bruits noted. Pulmonary/Chest: Normal effort and positive vesicular breath sounds. No respiratory distress. No wheezes, rales or ronchi noted.  Abdomen: Normal bowel sounds.  Musculoskeletal: No enlargement noted of bilateral knees.  Strength 5/5 BUE/BLE. No difficulty with gait.  Neurological: Alert and oriented. Cranial nerves II-XII grossly intact. Coordination normal.  Psychiatric: Mood and affect normal. Behavior is normal. Judgment and thought content normal.     BMET    Component Value Date/Time   NA 142 02/04/2023 0914   K 4.8 02/04/2023 0914   CL 103 02/04/2023 0914   CO2 32 02/04/2023 0914   GLUCOSE 148 (H) 02/04/2023 0914   BUN 15 02/04/2023 0914   CREATININE 0.63 02/04/2023 0914   CALCIUM  9.4 02/04/2023 0914   GFRNONAA >60 03/06/2021 0933   GFRNONAA 94 02/11/2018 0928   GFRAA 109 02/11/2018 0928    Lipid Panel     Component Value Date/Time   CHOL 114 02/04/2023 0914   TRIG 199 (H) 02/04/2023 0914   HDL 40 (L) 02/04/2023 0914   CHOLHDL 2.9 02/04/2023 0914   LDLCALC 47 02/04/2023 0914    CBC    Component Value Date/Time   WBC 6.9 02/04/2023 0914   RBC 4.60 02/04/2023 0914   HGB 13.8 02/04/2023 0914   HCT 41.4 02/04/2023 0914   PLT 269 02/04/2023 0914   MCV 90.0 02/04/2023 0914   MCH 30.0 02/04/2023 0914   MCHC 33.3 02/04/2023 0914   RDW 12.3 02/04/2023 0914   LYMPHSABS 1.3 03/06/2021 0933   MONOABS 1.0 03/06/2021 0933   EOSABS 0.0 03/06/2021 0933   BASOSABS 0.0 03/06/2021 0933    Hgb A1C Lab Results  Component Value Date   HGBA1C 6.8 (A) 02/04/2023            Assessment & Plan:   Preventative Health Maintenance:  Encouraged her to get a flu shot in the fall Tetanus UTD Pneumovax and Prevnar UTD Encouraged her to get her COVID booster Shingrix UTD She no longer needs to  screen for cervical cancer Mammogram ordered-she will call to schedule Bone density UTD Colon screening UTD Encouraged her to consume a  balanced diet and exercise regimen Advised her to see an eye doctor and dentist annually We will check CBC, c-Met, lipid, A1c and urine microalbumin today  RTC in 6 months, follow-up chronic conditions Helayne Lo, NP

## 2023-08-05 NOTE — Assessment & Plan Note (Signed)
Urine microalbumin today. 

## 2023-08-05 NOTE — Patient Instructions (Signed)
 Health Maintenance for Postmenopausal Women Menopause is a normal process in which your ability to get pregnant comes to an end. This process happens slowly over many months or years, usually between the ages of 24 and 62. Menopause is complete when you have missed your menstrual period for 12 months. It is important to talk with your health care provider about some of the most common conditions that affect women after menopause (postmenopausal women). These include heart disease, cancer, and bone loss (osteoporosis). Adopting a healthy lifestyle and getting preventive care can help to promote your health and wellness. The actions you take can also lower your chances of developing some of these common conditions. What are the signs and symptoms of menopause? During menopause, you may have the following symptoms: Hot flashes. These can be moderate or severe. Night sweats. Decrease in sex drive. Mood swings. Headaches. Tiredness (fatigue). Irritability. Memory problems. Problems falling asleep or staying asleep. Talk with your health care provider about treatment options for your symptoms. Do I need hormone replacement therapy? Hormone replacement therapy is effective in treating symptoms that are caused by menopause, such as hot flashes and night sweats. Hormone replacement carries certain risks, especially as you become older. If you are thinking about using estrogen or estrogen with progestin, discuss the benefits and risks with your health care provider. How can I reduce my risk for heart disease and stroke? The risk of heart disease, heart attack, and stroke increases as you age. One of the causes may be a change in the body's hormones during menopause. This can affect how your body uses dietary fats, triglycerides, and cholesterol. Heart attack and stroke are medical emergencies. There are many things that you can do to help prevent heart disease and stroke. Watch your blood pressure High  blood pressure causes heart disease and increases the risk of stroke. This is more likely to develop in people who have high blood pressure readings or are overweight. Have your blood pressure checked: Every 3-5 years if you are 50-75 years of age. Every year if you are 77 years old or older. Eat a healthy diet  Eat a diet that includes plenty of vegetables, fruits, low-fat dairy products, and lean protein. Do not eat a lot of foods that are high in solid fats, added sugars, or sodium. Get regular exercise Get regular exercise. This is one of the most important things you can do for your health. Most adults should: Try to exercise for at least 150 minutes each week. The exercise should increase your heart rate and make you sweat (moderate-intensity exercise). Try to do strengthening exercises at least twice each week. Do these in addition to the moderate-intensity exercise. Spend less time sitting. Even light physical activity can be beneficial. Other tips Work with your health care provider to achieve or maintain a healthy weight. Do not use any products that contain nicotine or tobacco. These products include cigarettes, chewing tobacco, and vaping devices, such as e-cigarettes. If you need help quitting, ask your health care provider. Know your numbers. Ask your health care provider to check your cholesterol and your blood sugar (glucose). Continue to have your blood tested as directed by your health care provider. Do I need screening for cancer? Depending on your health history and family history, you may need to have cancer screenings at different stages of your life. This may include screening for: Breast cancer. Cervical cancer. Lung cancer. Colorectal cancer. What is my risk for osteoporosis? After menopause, you may be  at increased risk for osteoporosis. Osteoporosis is a condition in which bone destruction happens more quickly than new bone creation. To help prevent osteoporosis or  the bone fractures that can happen because of osteoporosis, you may take the following actions: If you are 61-3 years old, get at least 1,000 mg of calcium and at least 600 international units (IU) of vitamin D per day. If you are older than age 61 but younger than age 75, get at least 1,200 mg of calcium and at least 600 international units (IU) of vitamin D per day. If you are older than age 62, get at least 1,200 mg of calcium and at least 800 international units (IU) of vitamin D per day. Smoking and drinking excessive alcohol increase the risk of osteoporosis. Eat foods that are rich in calcium and vitamin D, and do weight-bearing exercises several times each week as directed by your health care provider. How does menopause affect my mental health? Depression may occur at any age, but it is more common as you become older. Common symptoms of depression include: Feeling depressed. Changes in sleep patterns. Changes in appetite or eating patterns. Feeling an overall lack of motivation or enjoyment of activities that you previously enjoyed. Frequent crying spells. Talk with your health care provider if you think that you are experiencing any of these symptoms. General instructions See your health care provider for regular wellness exams and vaccines. This may include: Scheduling regular health, dental, and eye exams. Getting and maintaining your vaccines. These include: Influenza vaccine. Get this vaccine each year before the flu season begins. Pneumonia vaccine. Shingles vaccine. Tetanus, diphtheria, and pertussis (Tdap) booster vaccine. Your health care provider may also recommend other immunizations. Tell your health care provider if you have ever been abused or do not feel safe at home. Summary Menopause is a normal process in which your ability to get pregnant comes to an end. This condition causes hot flashes, night sweats, decreased interest in sex, mood swings, headaches, or lack  of sleep. Treatment for this condition may include hormone replacement therapy. Take actions to keep yourself healthy, including exercising regularly, eating a healthy diet, watching your weight, and checking your blood pressure and blood sugar levels. Get screened for cancer and depression. Make sure that you are up to date with all your vaccines. This information is not intended to replace advice given to you by your health care provider. Make sure you discuss any questions you have with your health care provider. Document Revised: 07/08/2020 Document Reviewed: 07/08/2020 Elsevier Patient Education  2024 [employer].

## 2023-08-05 NOTE — Assessment & Plan Note (Signed)
 Encourage diet and exercise for weight loss

## 2023-08-06 ENCOUNTER — Ambulatory Visit: Payer: Self-pay | Admitting: Internal Medicine

## 2023-08-06 LAB — LIPID PANEL
Cholesterol: 107 mg/dL (ref <200)
HDL: 38 mg/dL — ABNORMAL LOW (ref >=50)
LDL Cholesterol (Calc): 47 mg/dL
Non-HDL Cholesterol (Calc): 69 mg/dL (ref <130)
Total CHOL/HDL Ratio: 2.8 (calc) (ref <5.0)
Triglycerides: 132 mg/dL (ref <150)

## 2023-08-06 LAB — CBC
HCT: 42.7 % (ref 35.0–45.0)
Hemoglobin: 14.1 g/dL (ref 11.7–15.5)
MCH: 29.8 pg (ref 27.0–33.0)
MCHC: 33 g/dL (ref 32.0–36.0)
MCV: 90.3 fL (ref 80.0–100.0)
MPV: 11.3 fL (ref 7.5–12.5)
Platelets: 251 10*3/uL (ref 140–400)
RBC: 4.73 10*6/uL (ref 3.80–5.10)
RDW: 12.6 % (ref 11.0–15.0)
WBC: 6.8 10*3/uL (ref 3.8–10.8)

## 2023-08-06 LAB — COMPREHENSIVE METABOLIC PANEL WITH GFR
AG Ratio: 1.5 (calc) (ref 1.0–2.5)
ALT: 56 U/L — ABNORMAL HIGH (ref 6–29)
AST: 43 U/L — ABNORMAL HIGH (ref 10–35)
Albumin: 4.4 g/dL (ref 3.6–5.1)
Alkaline phosphatase (APISO): 81 U/L (ref 37–153)
BUN: 19 mg/dL (ref 7–25)
CO2: 26 mmol/L (ref 20–32)
Calcium: 9.6 mg/dL (ref 8.6–10.4)
Chloride: 101 mmol/L (ref 98–110)
Creat: 0.63 mg/dL (ref 0.60–1.00)
Globulin: 3 g/dL (ref 1.9–3.7)
Glucose, Bld: 168 mg/dL — ABNORMAL HIGH (ref 65–99)
Potassium: 4.4 mmol/L (ref 3.5–5.3)
Sodium: 138 mmol/L (ref 135–146)
Total Bilirubin: 0.7 mg/dL (ref 0.2–1.2)
Total Protein: 7.4 g/dL (ref 6.1–8.1)
eGFR: 94 mL/min/{1.73_m2} (ref >=60)

## 2023-08-06 LAB — HEMOGLOBIN A1C
Hgb A1c MFr Bld: 7.8 % — ABNORMAL HIGH (ref <5.7)
Mean Plasma Glucose: 177 mg/dL
eAG (mmol/L): 9.8 mmol/L

## 2023-08-06 LAB — MICROALBUMIN / CREATININE URINE RATIO
Creatinine, Urine: 97 mg/dL (ref 20–275)
Microalb Creat Ratio: 13 mg/g{creat} (ref <30)
Microalb, Ur: 1.3 mg/dL

## 2023-08-06 MED ORDER — OZEMPIC (0.25 OR 0.5 MG/DOSE) 2 MG/3ML ~~LOC~~ SOPN: 0.5000 mg | PEN_INJECTOR | SUBCUTANEOUS | 1 refills | Status: DC

## 2023-08-13 ENCOUNTER — Ambulatory Visit (INDEPENDENT_AMBULATORY_CARE_PROVIDER_SITE_OTHER): Payer: Medicare HMO

## 2023-08-13 VITALS — BP 122/68 | Ht 65.0 in | Wt 165.4 lb

## 2023-08-13 DIAGNOSIS — Z0000 Encounter for general adult medical examination without abnormal findings: Secondary | ICD-10-CM | POA: Diagnosis not present

## 2023-08-13 NOTE — Patient Instructions (Addendum)
 Maria Sloan , Thank you for taking time out of your busy schedule to complete your Annual Wellness Visit with me. I enjoyed our conversation and look forward to speaking with you again next year. I, as well as your care team,  appreciate your ongoing commitment to your health goals. Please review the following plan we discussed and let me know if I can assist you in the future.   Follow up Visits: Next Medicare AWV with our clinical staff:   08/18/24 @ 8:50 AM IN PERSON Have you seen your provider in the last 6 months (3 months if uncontrolled diabetes)? Yes   Clinician Recommendations:  Aim for 30 minutes of exercise or brisk walking, 6-8 glasses of water, and 5 servings of fruits and vegetables each day. TAKE CARE!      This is a list of the screening recommended for you and due dates:  Health Maintenance  Topic Date Due   Eye exam for diabetics  09/09/2023   Mammogram  09/22/2023   Flu Shot  10/01/2023   Hemoglobin A1C  02/04/2024   Yearly kidney function blood test for diabetes  08/04/2024   Yearly kidney health urinalysis for diabetes  08/04/2024   Complete foot exam   08/04/2024   Medicare Annual Wellness Visit  08/12/2024   Cologuard (Stool DNA test)  08/16/2025   DEXA scan (bone density measurement)  09/17/2026   DTaP/Tdap/Td vaccine (3 - Td or Tdap) 09/12/2029   Pneumococcal Vaccine for age over 80  Completed   Hepatitis C Screening  Completed   Zoster (Shingles) Vaccine  Completed   HPV Vaccine  Aged Out   Meningitis B Vaccine  Aged Out   Colon Cancer Screening  Discontinued   COVID-19 Vaccine  Discontinued    Advanced directives: (ACP Link)Information on Advanced Care Planning can be found at [employer] [facility] Advance Health Care Directives Advance Health Care Directives. [email or web address]  Advance Care Planning is important because it:  [x]  Makes sure you receive the medical care that is consistent with your values, goals, and preferences  [x]  It provides  guidance to your family and loved ones and reduces their decisional burden about whether or not they are making the right decisions based on your wishes.  Follow the link provided in your after visit summary or read over the paperwork we have mailed to you to help you started getting your Advance Directives in place. If you need assistance in completing these, please reach out to us  so that we can help you!

## 2023-08-13 NOTE — Progress Notes (Signed)
 Subjective:   Maria Sloan is a 73 y.o. who presents for a Medicare Wellness preventive visit.  As a reminder, Annual Wellness Visits don't include a physical exam, and some assessments may be limited, especially if this visit is performed virtually. We may recommend an in-person follow-up visit with your provider if needed.  Visit Complete: In person  VideoDeclined- This patient declined Interactive audio and Acupuncturist. Therefore the visit was completed with audio only.  Persons Participating in Visit: Patient.  AWV Questionnaire: No: Patient Medicare AWV questionnaire was not completed prior to this visit.  Cardiac Risk Factors include: advanced age (>42men, >48 women);dyslipidemia;diabetes mellitus;microalbuminuria     Objective:    Today's Vitals   08/13/23 0846  BP: 122/68  Weight: 165 lb 6.4 oz (75 kg)  Height: 5' 5 (1.651 m)   Body mass index is 27.52 kg/m.     08/13/2023    8:54 AM 10/12/2022    7:57 AM 08/07/2022    9:22 AM 02/28/2022   10:12 AM 06/05/2021   10:23 AM 03/06/2021    9:22 AM 05/28/2020    9:42 AM  Advanced Directives  Does Patient Have a Medical Advance Directive? No Yes No No No No No  Type of Advance Directive  Living will;Healthcare Power of Attorney       Copy of Healthcare Power of Attorney in Chart?  No - copy requested       Would patient like information on creating a medical advance directive? No - Patient declined  No - Patient declined  No - Patient declined No - Patient declined     Current Medications (verified) Outpatient Encounter Medications as of 08/13/2023  Medication Sig   Accu-Chek Softclix Lancets lancets ONE TIME DAILY AS DIRECTED. FOR ICD-10 E10.9, E11.9 A1C 02/11/2018- 11.8%   atorvastatin  (LIPITOR) 80 MG tablet Take 1 tablet (80 mg total) by mouth daily.   Blood Glucose Monitoring Suppl (ACCU-CHEK AVIVA PLUS) w/Device KIT 1 Device by Does not apply route as directed. . One time daily as directed. FOR ICD-10  E10.9, E11.9 A1C 02/11/2018- 11.8%   busPIRone  (BUSPAR ) 5 MG tablet TAKE 1 TABLET BY MOUTH EVERYDAY AT BEDTIME   CALCIUM  CITRATE PO Take 1 tablet by mouth daily.    Cholecalciferol (VITAMIN D  PO) Take 1 tablet by mouth daily.    citalopram  (CELEXA ) 20 MG tablet TAKE 1 TABLET BY MOUTH EVERY DAY   ezetimibe  (ZETIA ) 10 MG tablet TAKE 1 TABLET BY MOUTH EVERY DAY   fluticasone  (FLONASE ) 50 MCG/ACT nasal spray PLACE 2 SPRAYS INTO BOTH NOSTRILS DAILY. USE FOR 4-6 WEEKS THEN STOP AND USE AS NEEDED   glimepiride  (AMARYL ) 2 MG tablet TAKE 1 TABLET BY MOUTH EVERY DAY WITH BREAKFAST   glucose blood (ACCU-CHEK AVIVA PLUS) test strip USE UP TO ONE TIME AS DIRECTED   ibuprofen (ADVIL) 800 MG tablet Take 800 mg by mouth every 8 (eight) hours as needed.   lisinopril  (ZESTRIL ) 5 MG tablet TAKE 1 TABLET (5 MG TOTAL) BY MOUTH DAILY.   meclizine  (ANTIVERT ) 25 MG tablet Take 1 tablet (25 mg total) by mouth 3 (three) times daily as needed for dizziness.   metFORMIN  (GLUCOPHAGE ) 1000 MG tablet Take 1 tablet (1,000 mg total) by mouth 2 (two) times daily with a meal.   Multiple Vitamin (MULTIVITAMIN) tablet Take 1 tablet by mouth daily.   Omega-3 Fatty Acids (FISH OIL) 1000 MG CAPS Take 1,000 mg by mouth 3 (three) times daily.   OZEMPIC , 0.25 OR 0.5  MG/DOSE, 2 MG/3ML SOPN Inject 0.5 mg into the skin once a week. Start with 0.25mg  weekly x 4 weeks then increase to 0.5mg  weekly injection.   Potassium 75 MG TABS Take 1 tablet by mouth daily.   No facility-administered encounter medications on file as of 08/13/2023.    Allergies (verified) Prochlorperazine, Other, Triprolidine-pseudoephedrine, Actifed cold-allergy [chlorpheniramine-phenylephrine ], and Penicillins   History: Past Medical History:  Diagnosis Date   Anxiety    Arthritis    Cervical cancer (HCC)    Hyperlipidemia    PVD (peripheral vascular disease) (HCC)    R leg venous insufficiency   PVD (peripheral vascular disease) (HCC)    Shingles    Type 2  diabetes mellitus (HCC)    Past Surgical History:  Procedure Laterality Date   COLONOSCOPY WITH PROPOFOL  N/A 10/12/2022   Procedure: COLONOSCOPY WITH PROPOFOL ;  Surgeon: Selena Daily, MD;  Location: [facility] ENDOSCOPY;  Service: Gastroenterology;  Laterality: N/A;   PARTIAL HYSTERECTOMY     ovaries remain   POLYPECTOMY  10/12/2022   Procedure: POLYPECTOMY;  Surgeon: Selena Daily, MD;  Location: [facility] ENDOSCOPY;  Service: Gastroenterology;;   ROTATOR CUFF REPAIR Right    Family History  Problem Relation Age of Onset   Stroke Mother    Emphysema Father    Lung cancer Father    Hyperlipidemia Father    Hyperlipidemia Sister    Healthy Son    Colon cancer Neg Hx    Breast cancer Neg Hx    Ovarian cancer Neg Hx    Social History   Socioeconomic History   Marital status: Divorced    Spouse name: Not on file   Number of children: Not on file   Years of education: Not on file   Highest education level: Bachelor's degree (e.g., BA, AB, BS)  Occupational History   Occupation: retired   Tobacco Use   Smoking status: Former    Current packs/day: 0.00    Average packs/day: 1 pack/day for 25.0 years (25.0 ttl pk-yrs)    Types: Cigarettes    Start date: 11/28/1969    Quit date: 11/29/1994    Years since quitting: 28.7   Smokeless tobacco: Never  Vaping Use   Vaping status: Never Used  Substance and Sexual Activity   Alcohol use: Yes    Alcohol/week: 0.0 standard drinks of alcohol    Comment: occasionally   Drug use: Never   Sexual activity: Not Currently    Birth control/protection: None  Other Topics Concern   Not on file  Social History Narrative   Divorced.   1 son.    Moved from [place] .   Enjoys making jewelry, spending time with family, crafts, reading.   Social Drivers of Corporate investment banker Strain: Low Risk  (08/13/2023)   Overall Financial Resource Strain (CARDIA)    Difficulty of Paying Living Expenses: Not very hard  Recent Concern: Financial  Resource Strain - Medium Risk (08/12/2023)   Overall Financial Resource Strain (CARDIA)    Difficulty of Paying Living Expenses: Somewhat hard  Food Insecurity: No Food Insecurity (08/13/2023)   Hunger Vital Sign    Worried About Running Out of Food in the Last Year: Never true    Ran Out of Food in the Last Year: Never true  Transportation Needs: No Transportation Needs (08/13/2023)   PRAPARE - Administrator, Civil Service (Medical): No    Lack of Transportation (Non-Medical): No  Physical Activity: Insufficiently Active (08/13/2023)   Exercise  Vital Sign    Days of Exercise per Week: 4 days    Minutes of Exercise per Session: 30 min  Stress: No Stress Concern Present (08/13/2023)   [employer] of Occupational Health - Occupational Stress Questionnaire    Feeling of Stress: Only a little  Social Connections: Socially Isolated (08/13/2023)   Social Connection and Isolation Panel    Frequency of Communication with Friends and Family: More than three times a week    Frequency of Social Gatherings with Friends and Family: Once a week    Attends Religious Services: Never    Database administrator or Organizations: No    Attends Engineer, structural: Never    Marital Status: Divorced    Tobacco Counseling Counseling given: Not Answered    Clinical Intake:  Pre-visit preparation completed: Yes  Pain : No/denies pain     BMI - recorded: 27.52 Nutritional Status: BMI 25 -29 Overweight Nutritional Risks: None Diabetes: Yes CBG done?: No Did pt. bring in CBG monitor from home?: No  Lab Results  Component Value Date   HGBA1C 7.8 (H) 08/05/2023   HGBA1C 6.8 (A) 02/04/2023   HGBA1C 6.4 (H) 08/04/2022     How often do you need to have someone help you when you read instructions, pamphlets, or other written materials from your doctor or pharmacy?: 1 - Never  Interpreter Needed?: No  Information entered by :: Dellie Fergusson, LPN   Activities of Daily  Living    08/13/2023    8:55 AM  In your present state of health, do you have any difficulty performing the following activities:  Hearing? 1  Vision? 0  Difficulty concentrating or making decisions? 0  Walking or climbing stairs? 1  Comment KNEE HURTS GOING UP STAIRS  Dressing or bathing? 0  Doing errands, shopping? 0  Preparing Food and eating ? N  Using the Toilet? N  In the past six months, have you accidently leaked urine? N  Do you have problems with loss of bowel control? N  Managing your Medications? N  Managing your Finances? N  Housekeeping or managing your Housekeeping? N    Patient Care Team: Carollynn Cirri, NP as PCP - [facility]  I have updated your Care Teams any recent Medical Services you may have received from other providers in the past year.     Assessment:   This is a routine wellness examination for Maria Sloan.  Hearing/Vision screen Hearing Screening - Comments:: WEARS AIDS, BOTH EARS Vision Screening - Comments:: WEARS GLASSES ALL DAY- WOODARD   Goals Addressed             This Visit's Progress    DIET - INCREASE WATER INTAKE         Depression Screen     08/13/2023    8:53 AM 08/05/2023    8:51 AM 02/04/2023    9:21 AM 08/07/2022    9:20 AM 08/04/2022    9:28 AM 05/05/2022    8:49 AM 10/01/2021    8:40 AM  PHQ 2/9 Scores  PHQ - 2 Score 1 1 0 2 1 3  0  PHQ- 9 Score 1 3  4  6 2     Fall Risk     08/13/2023    8:55 AM 08/05/2023    8:51 AM 02/04/2023    9:21 AM 08/07/2022    9:22 AM 08/04/2022   10:38 AM  Fall Risk   Falls in  the past year? 1 1 0 1 1  Number falls in past yr: 1 0  0 0  Injury with Fall? 0 0  0 0  Risk for fall due to :    History of fall(s)   Follow up Falls evaluation completed;Falls prevention discussed   Falls prevention discussed;Falls evaluation completed     MEDICARE RISK AT HOME:  Medicare Risk at Home Any stairs in or around the home?: Yes If so, are there any without  handrails?: No Home free of loose throw rugs in walkways, pet beds, electrical cords, etc?: Yes Adequate lighting in your home to reduce risk of falls?: Yes Life alert?: No Use of a cane, walker or w/c?: No Grab bars in the bathroom?: No Shower chair or bench in shower?: Yes Elevated toilet seat or a handicapped toilet?: No  TIMED UP AND GO:  Was the test performed?  Yes  Length of time to ambulate 10 feet: 4 sec Gait steady and fast without use of assistive device  Cognitive Function: 6CIT completed        08/13/2023    8:57 AM 08/07/2022    9:29 AM 06/05/2021   10:20 AM 05/28/2020    9:45 AM  6CIT Screen  What Year? 0 points 0 points 0 points 0 points  What month? 0 points 0 points 0 points 0 points  What time? 0 points 0 points 0 points 0 points  Count back from 20 0 points 0 points 0 points 0 points  Months in reverse 0 points 0 points 0 points 0 points  Repeat phrase 0 points 0 points 0 points 0 points  Total Score 0 points 0 points 0 points 0 points    Immunizations Immunization History  Administered Date(s) Administered   Fluad Quad(high Dose 65+) 01/08/2021   Influenza, High Dose Seasonal PF 12/17/2018, 11/20/2022   Influenza,inj,Quad PF,6+ Mos 12/13/2015   Influenza-Unspecified 12/10/2014, 01/07/2018, 01/30/2019, 11/20/2022   PFIZER(Purple Top)SARS-COV-2 Vaccination 05/12/2019, 06/12/2019, 02/17/2020   Pneumococcal Conjugate-13 01/07/2018   Pneumococcal Polysaccharide-23 09/13/2019   Tdap 04/05/2015, 09/13/2019   Zoster Recombinant(Shingrix) 06/12/2021, 09/09/2021    Screening Tests Health Maintenance  Topic Date Due   OPHTHALMOLOGY EXAM  09/09/2023   MAMMOGRAM  09/22/2023   INFLUENZA VACCINE  10/01/2023   HEMOGLOBIN A1C  02/04/2024   Diabetic kidney evaluation - eGFR measurement  08/04/2024   Diabetic kidney evaluation - Urine ACR  08/04/2024   FOOT EXAM  08/04/2024   Medicare Annual Wellness (AWV)  08/12/2024   Fecal DNA (Cologuard)  08/16/2025   DEXA  SCAN  09/17/2026   DTaP/Tdap/Td (3 - Td or Tdap) 09/12/2029   Pneumococcal Vaccine: 50+ Years  Completed   Hepatitis C Screening  Completed   Zoster Vaccines- Shingrix  Completed   HPV VACCINES  Aged Out   Meningococcal B Vaccine  Aged Out   Colonoscopy  Discontinued   COVID-19 Vaccine  Discontinued    Health Maintenance  There are no preventive care reminders to display for this patient. Health Maintenance Items Addressed: UP TO DATE W/ COLONOSCOPY, EYE EXAM & BDS; MAMMOGRAM ORDERED LAST WEEK; SHOTS UP TO DATE EXCEPT COVID  Additional Screening:  Vision Screening: Recommended annual ophthalmology exams for early detection of glaucoma and other disorders of the eye. Would you like a referral to an eye doctor? No    Dental Screening: Recommended annual dental exams for proper oral hygiene  Community Resource Referral / Chronic Care Management: CRR required this visit?  No  CCM required this visit?  No   Plan:    I have personally reviewed and noted the following in the patient's chart:   Medical and social history Use of alcohol, tobacco or illicit drugs  Current medications and supplements including opioid prescriptions. Patient is not currently taking opioid prescriptions. Functional ability and status Nutritional status Physical activity Advanced directives List of other physicians Hospitalizations, surgeries, and ER visits in previous 12 months Vitals Screenings to include cognitive, depression, and falls Referrals and appointments  In addition, I have reviewed and discussed with patient certain preventive protocols, quality metrics, and best practice recommendations. A written personalized care plan for preventive services as well as general preventive health recommendations were provided to patient.   Pinky Bright, LPN   06/08/8117   After Visit Summary: (In Person-Declined) Patient declined AVS at this time.  Notes: Nothing significant to report at this  time.

## 2023-08-22 ENCOUNTER — Other Ambulatory Visit: Payer: Self-pay | Admitting: Internal Medicine

## 2023-08-22 DIAGNOSIS — F419 Anxiety disorder, unspecified: Secondary | ICD-10-CM

## 2023-08-22 DIAGNOSIS — F32A Depression, unspecified: Secondary | ICD-10-CM

## 2023-08-24 NOTE — Telephone Encounter (Signed)
 LOV 08/05/2023  Requested Prescriptions  Pending Prescriptions Disp Refills   citalopram  (CELEXA ) 20 MG tablet [Pharmacy Med Name: CITALOPRAM  HBR 20 MG TABLET] 90 tablet 1    Sig: TAKE 1 TABLET BY MOUTH EVERY DAY     Psychiatry:  Antidepressants - SSRI Failed - 08/24/2023  2:19 PM      Failed - Valid encounter within last 6 months    Recent Outpatient Visits           2 weeks ago Encounter for general adult medical examination with abnormal findings   [facility] [place], [place], NP              Passed - Completed PHQ-2 or PHQ-9 in the last 360 days

## 2023-09-12 ENCOUNTER — Other Ambulatory Visit: Payer: Self-pay | Admitting: Internal Medicine

## 2023-09-14 NOTE — Telephone Encounter (Signed)
 Requested Prescriptions  Pending Prescriptions Disp Refills   metFORMIN  (GLUCOPHAGE ) 1000 MG tablet [Pharmacy Med Name: METFORMIN  HCL 1,000 MG TABLET] 180 tablet 1    Sig: TAKE 1 TABLET (1,000 MG TOTAL) BY MOUTH TWICE A DAY WITH FOOD     Endocrinology:  Diabetes - Biguanides Failed - 09/14/2023 11:49 AM      Failed - B12 Level in normal range and within 720 days    No results found for: VITAMINB12       Failed - CBC within normal limits and completed in the last 12 months    WBC  Date Value Ref Range Status  08/05/2023 6.8 3.8 - 10.8 Thousand/uL Final   RBC  Date Value Ref Range Status  08/05/2023 4.73 3.80 - 5.10 Million/uL Final   Hemoglobin  Date Value Ref Range Status  08/05/2023 14.1 11.7 - 15.5 g/dL Final   HCT  Date Value Ref Range Status  08/05/2023 42.7 35.0 - 45.0 % Final   MCHC  Date Value Ref Range Status  08/05/2023 33.0 32.0 - 36.0 g/dL Final    Comment:    For adults, a slight decrease in the calculated MCHC value (in the range of 30 to 32 g/dL) is most likely not clinically significant; however, it should be interpreted with caution in correlation with other red cell parameters and the patient's clinical condition.    [facility]  Date Value Ref Range Status  08/05/2023 29.8 27.0 - 33.0 pg Final   MCV  Date Value Ref Range Status  08/05/2023 90.3 80.0 - 100.0 fL Final   No results found for: PLTCOUNTKUC, LABPLAT, POCPLA RDW  Date Value Ref Range Status  08/05/2023 12.6 11.0 - 15.0 % Final         Passed - Cr in normal range and within 360 days    Creat  Date Value Ref Range Status  08/05/2023 0.63 0.60 - 1.00 mg/dL Final   Creatinine, Urine  Date Value Ref Range Status  08/05/2023 97 20 - 275 mg/dL Final         Passed - HBA1C is between 0 and 7.9 and within 180 days    Hgb A1c MFr Bld  Date Value Ref Range Status  08/05/2023 7.8 (H) <5.7 % Final    Comment:    For someone without known diabetes, a hemoglobin A1c value of 6.5% or  greater indicates that they may have  diabetes and this should be confirmed with a follow-up  test. . For someone with known diabetes, a value <7% indicates  that their diabetes is well controlled and a value  greater than or equal to 7% indicates suboptimal  control. A1c targets should be individualized based on  duration of diabetes, age, comorbid conditions, and  other considerations. . Currently, no consensus exists regarding use of hemoglobin A1c for diagnosis of diabetes for children. .          Passed - eGFR in normal range and within 360 days    GFR, Est African American  Date Value Ref Range Status  02/11/2018 109 > OR = 60 mL/min/1.42m2 Final   GFR, Est Non African American  Date Value Ref Range Status  02/11/2018 94 > OR = 60 mL/min/1.25m2 Final   GFR, Estimated  Date Value Ref Range Status  03/06/2021 >60 >60 mL/min Final    Comment:    (NOTE) Calculated using the CKD-EPI Creatinine Equation (2021)    GFR  Date Value Ref Range Status  07/12/2015 87.75 >60.00  mL/min Final   eGFR  Date Value Ref Range Status  08/05/2023 94 > OR = 60 mL/min/1.55m2 Final         Passed - Valid encounter within last 6 months    Recent Outpatient Visits           1 month ago Encounter for general adult medical examination with abnormal findings   [place] [facility] [place], [name], NP

## 2023-09-23 ENCOUNTER — Ambulatory Visit
Admission: RE | Admit: 2023-09-23 | Discharge: 2023-09-23 | Disposition: A | Discharge status: Home / Self Care | Source: Ambulatory Visit | Attending: Internal Medicine | Admitting: Internal Medicine

## 2023-09-23 DIAGNOSIS — Z0001 Encounter for general adult medical examination with abnormal findings: Secondary | ICD-10-CM | POA: Diagnosis present

## 2023-09-23 DIAGNOSIS — Z1231 Encounter for screening mammogram for malignant neoplasm of breast: Secondary | ICD-10-CM | POA: Diagnosis present

## 2023-10-22 ENCOUNTER — Other Ambulatory Visit: Payer: Self-pay | Admitting: Internal Medicine

## 2023-10-22 DIAGNOSIS — E782 Mixed hyperlipidemia: Secondary | ICD-10-CM

## 2023-10-22 NOTE — Telephone Encounter (Signed)
 Requested Prescriptions  Pending Prescriptions Disp Refills   ezetimibe  (ZETIA ) 10 MG tablet [Pharmacy Med Name: EZETIMIBE  10 MG TABLET] 90 tablet 1    Sig: TAKE 1 TABLET BY MOUTH EVERY DAY     Cardiovascular:  Antilipid - Sterol Transport Inhibitors Failed - 10/22/2023  3:47 PM      Failed - AST in normal range and within 360 days    AST  Date Value Ref Range Status  08/05/2023 43 (H) 10 - 35 U/L Final         Failed - ALT in normal range and within 360 days    ALT  Date Value Ref Range Status  08/05/2023 56 (H) 6 - 29 U/L Final         Failed - Lipid Panel in normal range within the last 12 months    Cholesterol  Date Value Ref Range Status  08/05/2023 107 <200 mg/dL Final   LDL Cholesterol (Calc)  Date Value Ref Range Status  08/05/2023 47 mg/dL (calc) Final    Comment:    Reference range: <100 . Desirable range <100 mg/dL for primary prevention;   <70 mg/dL for patients with CHD or diabetic patients  with > or = 2 CHD risk factors. SABRA LDL-C is now calculated using the Martin-Hopkins  calculation, which is a validated novel method providing  better accuracy than the Friedewald equation in the  estimation of LDL-C.  Gladis APPLETHWAITE et al. SANDREA. 7986;689(80): 2061-2068  (http://education.[facility].com/faq/FAQ164)    Direct LDL  Date Value Ref Range Status  12/13/2015 115.0 mg/dL Final    Comment:    Optimal:  <100 mg/dLNear or Above Optimal:  100-129 mg/dLBorderline High:  130-159 mg/dLHigh:  160-189 mg/dLVery High:  >190 mg/dL   HDL  Date Value Ref Range Status  08/05/2023 38 (L) > OR = 50 mg/dL Final   Triglycerides  Date Value Ref Range Status  08/05/2023 132 <150 mg/dL Final         Passed - Patient is not pregnant      Passed - Valid encounter within last 12 months    Recent Outpatient Visits           2 months ago Encounter for general adult medical examination with abnormal findings   [facility] [place], [name], NP

## 2023-10-27 ENCOUNTER — Other Ambulatory Visit: Payer: Self-pay | Admitting: Internal Medicine

## 2023-10-27 DIAGNOSIS — F419 Anxiety disorder, unspecified: Secondary | ICD-10-CM

## 2023-10-28 NOTE — Telephone Encounter (Signed)
 Requested Prescriptions  Pending Prescriptions Disp Refills   glimepiride  (AMARYL ) 2 MG tablet [Pharmacy Med Name: GLIMEPIRIDE  2 MG TABLET] 90 tablet 1    Sig: TAKE 1 TABLET BY MOUTH EVERY DAY WITH BREAKFAST     Endocrinology:  Diabetes - Sulfonylureas Passed - 10/28/2023  1:43 PM      Passed - HBA1C is between 0 and 7.9 and within 180 days    Hgb A1c MFr Bld  Date Value Ref Range Status  08/05/2023 7.8 (H) <5.7 % Final    Comment:    For someone without known diabetes, a hemoglobin A1c value of 6.5% or greater indicates that they may have  diabetes and this should be confirmed with a follow-up  test. . For someone with known diabetes, a value <7% indicates  that their diabetes is well controlled and a value  greater than or equal to 7% indicates suboptimal  control. A1c targets should be individualized based on  duration of diabetes, age, comorbid conditions, and  other considerations. . Currently, no consensus exists regarding use of hemoglobin A1c for diagnosis of diabetes for children. .          Passed - Cr in normal range and within 360 days    Creat  Date Value Ref Range Status  08/05/2023 0.63 0.60 - 1.00 mg/dL Final   Creatinine, Urine  Date Value Ref Range Status  08/05/2023 97 20 - 275 mg/dL Final         Passed - Valid encounter within last 6 months    Recent Outpatient Visits           2 months ago Encounter for general adult medical examination with abnormal findings   [place] [facility] [place], [place], NP               busPIRone  (BUSPAR ) 5 MG tablet [Pharmacy Med Name: BUSPIRONE  HCL 5 MG TABLET] 90 tablet 1    Sig: TAKE 1 TABLET BY MOUTH EVERYDAY AT BEDTIME     Psychiatry: Anxiolytics/Hypnotics - Non-controlled Passed - 10/28/2023  1:43 PM      Passed - Valid encounter within last 12 months    Recent Outpatient Visits           2 months ago Encounter for general adult medical examination with abnormal findings   [facility] [place], [name], NP

## 2024-01-05 ENCOUNTER — Encounter: Payer: Self-pay | Admitting: Internal Medicine

## 2024-01-20 ENCOUNTER — Other Ambulatory Visit: Payer: Self-pay | Admitting: Internal Medicine

## 2024-01-21 NOTE — Telephone Encounter (Signed)
 Requested Prescriptions  Pending Prescriptions Disp Refills   atorvastatin  (LIPITOR) 80 MG tablet [Pharmacy Med Name: ATORVASTATIN  80 MG TABLET] 90 tablet 1    Sig: TAKE 1 TABLET BY MOUTH EVERY DAY     Cardiovascular:  Antilipid - Statins Failed - 01/21/2024  4:31 PM      Failed - Lipid Panel in normal range within the last 12 months    Cholesterol  Date Value Ref Range Status  08/05/2023 107 <200 mg/dL Final   LDL Cholesterol (Calc)  Date Value Ref Range Status  08/05/2023 47 mg/dL (calc) Final    Comment:    Reference range: <100 . Desirable range <100 mg/dL for primary prevention;   <70 mg/dL for patients with CHD or diabetic patients  with > or = 2 CHD risk factors. SABRA LDL-C is now calculated using the Martin-Hopkins  calculation, which is a validated novel method providing  better accuracy than the Friedewald equation in the  estimation of LDL-C.  Gladis APPLETHWAITE et al. SANDREA. 7986;689(80): 2061-2068  (http://education.[facility].com/faq/FAQ164)    Direct LDL  Date Value Ref Range Status  12/13/2015 115.0 mg/dL Final    Comment:    Optimal:  <100 mg/dLNear or Above Optimal:  100-129 mg/dLBorderline High:  130-159 mg/dLHigh:  160-189 mg/dLVery High:  >190 mg/dL   HDL  Date Value Ref Range Status  08/05/2023 38 (L) > OR = 50 mg/dL Final   Triglycerides  Date Value Ref Range Status  08/05/2023 132 <150 mg/dL Final         Passed - Patient is not pregnant      Passed - Valid encounter within last 12 months    Recent Outpatient Visits           5 months ago Encounter for general adult medical examination with abnormal findings   [place] [facility] [place], [name], NP

## 2024-01-31 ENCOUNTER — Encounter: Payer: Self-pay | Admitting: Internal Medicine

## 2024-02-08 DIAGNOSIS — H4322 Crystalline deposits in vitreous body, left eye: Secondary | ICD-10-CM | POA: Diagnosis not present

## 2024-02-08 DIAGNOSIS — F331 Major depressive disorder, recurrent, moderate: Secondary | ICD-10-CM | POA: Insufficient documentation

## 2024-02-08 DIAGNOSIS — E119 Type 2 diabetes mellitus without complications: Secondary | ICD-10-CM | POA: Diagnosis not present

## 2024-02-08 DIAGNOSIS — H2513 Age-related nuclear cataract, bilateral: Secondary | ICD-10-CM | POA: Diagnosis not present

## 2024-02-08 DIAGNOSIS — H401131 Primary open-angle glaucoma, bilateral, mild stage: Secondary | ICD-10-CM | POA: Diagnosis not present

## 2024-02-08 LAB — OPHTHALMOLOGY REPORT-SCANNED

## 2024-02-08 NOTE — Progress Notes (Unsigned)
 Subjective:    Patient ID: Maria Sloan, female    DOB: [birth year], 73 y.o.   MRN: [id number]  HPI  Patient presents to clinic today for 96-month follow-up of chronic conditions.  HLD: Her last LDL was 47, triglycerides 867, 08/2023.  She denies myalgias on fish oil, atorvastatin  and ezetimibe .  She tries to consume low-fat diet.  DM2: Her last A1c was 7.8 %, 08/2023.  She is taking metformin , glimepiride  and semaglutide  as prescribed.  She is on lisinopril  for renal protection.  Her sugars range 133-186. She checks her feet as needed.  Her last eye exam was 08/2022.  Flu 01/2024.  Pneumovax 12/2019.  Prevnar 12/2017.  COVID Pfizer x 3.  Anxiety and depression (recurrent, moderate): Chronic, managed on citalopram  and buspirone . She is not currently seeing a therapist.  She denies SI/HI.  Osteopenia: She is taking calcium  and vitamin d  OTC.  She tries to get weightbearing exercise daily.  Bone density from 08/2021 reviewed.  OA: Mainly in back and her right knee.  She is taking with some relief of symptoms. She has had a steroid injection in her knee.  X-ray of knee from 09/2021 reviewed.  She does follow with orthopedics.  Vertigo: Intermittent.  She typically manages this with the epley maneuver and takes meclizine  as needed with good relief of symptoms.  Review of Systems     Past Medical History:  Diagnosis Date   Anxiety    Arthritis    Cervical cancer (HCC)    Hyperlipidemia    PVD (peripheral vascular disease)    R leg venous insufficiency   PVD (peripheral vascular disease)    Shingles    Type 2 diabetes mellitus (HCC)     Current Outpatient Medications  Medication Sig Dispense Refill   Accu-Chek Softclix Lancets lancets ONE TIME DAILY AS DIRECTED. FOR ICD-10 E10.9, E11.9 A1C 02/11/2018- 11.8% 100 each 12   atorvastatin  (LIPITOR) 80 MG tablet TAKE 1 TABLET BY MOUTH EVERY DAY 90 tablet 1   Blood Glucose Monitoring Suppl (ACCU-CHEK AVIVA PLUS) w/Device KIT 1 Device by Does  not apply route as directed. . One time daily as directed. FOR ICD-10 E10.9, E11.9 A1C 02/11/2018- 11.8% 1 kit 0   busPIRone  (BUSPAR ) 5 MG tablet TAKE 1 TABLET BY MOUTH EVERYDAY AT BEDTIME 90 tablet 1   CALCIUM  CITRATE PO Take 1 tablet by mouth daily.      Cholecalciferol (VITAMIN D  PO) Take 1 tablet by mouth daily.      citalopram  (CELEXA ) 20 MG tablet TAKE 1 TABLET BY MOUTH EVERY DAY 90 tablet 1   ezetimibe  (ZETIA ) 10 MG tablet TAKE 1 TABLET BY MOUTH EVERY DAY 90 tablet 2   fluticasone  (FLONASE ) 50 MCG/ACT nasal spray PLACE 2 SPRAYS INTO BOTH NOSTRILS DAILY. USE FOR 4-6 WEEKS THEN STOP AND USE AS NEEDED 48 mL 0   glimepiride  (AMARYL ) 2 MG tablet TAKE 1 TABLET BY MOUTH EVERY DAY WITH BREAKFAST 90 tablet 1   glucose blood (ACCU-CHEK AVIVA PLUS) test strip USE UP TO ONE TIME AS DIRECTED 100 strip 2   ibuprofen (ADVIL) 800 MG tablet Take 800 mg by mouth every 8 (eight) hours as needed.     lisinopril  (ZESTRIL ) 5 MG tablet TAKE 1 TABLET (5 MG TOTAL) BY MOUTH DAILY. 90 tablet 0   meclizine  (ANTIVERT ) 25 MG tablet Take 1 tablet (25 mg total) by mouth 3 (three) times daily as needed for dizziness. 30 tablet 0   metFORMIN  (GLUCOPHAGE ) 1000 MG tablet TAKE  1 TABLET (1,000 MG TOTAL) BY MOUTH TWICE A DAY WITH FOOD 180 tablet 1   Multiple Vitamin (MULTIVITAMIN) tablet Take 1 tablet by mouth daily.     Omega-3 Fatty Acids (FISH OIL) 1000 MG CAPS Take 1,000 mg by mouth 3 (three) times daily.     OZEMPIC , 0.25 OR 0.5 MG/DOSE, 2 MG/3ML SOPN Inject 0.5 mg into the skin once a week. Start with 0.25mg  weekly x 4 weeks then increase to 0.5mg  weekly injection. 9 mL 1   Potassium 75 MG TABS Take 1 tablet by mouth daily.     No current facility-administered medications for this visit.    Allergies  Allergen Reactions   Prochlorperazine Other (See Comments)    Compazine   Other Hives    Other reaction(s): Dizziness   Triprolidine-Pseudoephedrine Other (See Comments)   Actifed Cold-Allergy  [Chlorpheniramine-Phenylephrine ]     Ok with Robitussin, Mucinex   Penicillins     Family History  Problem Relation Age of Onset   Stroke Mother    Emphysema Father    Lung cancer Father    Hyperlipidemia Father    Hyperlipidemia Sister    Healthy Son    Colon cancer Neg Hx    Breast cancer Neg Hx    Ovarian cancer Neg Hx     Social History   Socioeconomic History   Marital status: Divorced    Spouse name: Not on file   Number of children: Not on file   Years of education: Not on file   Highest education level: Bachelor's degree (e.g., BA, AB, BS)  Occupational History   Occupation: retired   Tobacco Use   Smoking status: Former    Current packs/day: 0.00    Average packs/day: 1 pack/day for 25.0 years (25.0 ttl pk-yrs)    Types: Cigarettes    Start date: 11/28/1969    Quit date: 11/29/1994    Years since quitting: 29.2   Smokeless tobacco: Never  Vaping Use   Vaping status: Never Used  Substance and Sexual Activity   Alcohol use: Yes    Alcohol/week: 0.0 standard drinks of alcohol    Comment: occasionally   Drug use: Never   Sexual activity: Not Currently    Birth control/protection: None  Other Topics Concern   Not on file  Social History Narrative   Divorced.   1 son.    Moved from [place] .   Enjoys making jewelry, spending time with family, crafts, reading.   Social Drivers of Corporate Investment Banker Strain: Low Risk  (02/07/2024)   Overall Financial Resource Strain (CARDIA)    Difficulty of Paying Living Expenses: Not very hard  Food Insecurity: No Food Insecurity (02/07/2024)   Hunger Vital Sign    Worried About Running Out of Food in the Last Year: Never true    Ran Out of Food in the Last Year: Never true  Transportation Needs: No Transportation Needs (02/07/2024)   PRAPARE - Administrator, Civil Service (Medical): No    Lack of Transportation (Non-Medical): No  Physical Activity: Insufficiently Active (02/07/2024)   Exercise Vital  Sign    Days of Exercise per Week: 2 days    Minutes of Exercise per Session: 30 min  Stress: No Stress Concern Present (02/07/2024)   [employer] of Occupational Health - Occupational Stress Questionnaire    Feeling of Stress: Only a little  Social Connections: Socially Isolated (02/07/2024)   Social Connection and Isolation Panel    Frequency of  Communication with Friends and Family: More than three times a week    Frequency of Social Gatherings with Friends and Family: Three times a week    Attends Religious Services: Never    Active Member of Clubs or Organizations: No    Attends Banker Meetings: Not on file    Marital Status: Divorced  Intimate Partner Violence: Not At Risk (08/13/2023)   Humiliation, Afraid, Rape, and Kick questionnaire    Fear of Current or Ex-Partner: No    Emotionally Abused: No    Physically Abused: No    Sexually Abused: No     Constitutional: Denies fever, malaise, fatigue, headache or abrupt weight changes.  HEENT: Denies eye pain, eye redness, ear pain, ringing in the ears, wax buildup, runny nose, nasal congestion, bloody nose, or sore throat. Respiratory: Denies difficulty breathing, shortness of breath, cough or sputum production.   Cardiovascular: Denies chest pain, chest tightness, palpitations or swelling in the hands or feet.  Gastrointestinal: Denies abdominal pain, bloating, constipation, diarrhea or blood in the stool.  GU: Denies urgency, frequency, pain with urination, burning sensation, blood in urine, or discharge. Musculoskeletal: Patient reports knee pain.  Denies decrease in range of motion, difficulty with gait, muscle pain or joint pain and swelling.  Skin: Denies redness, rashes, lesions or ulcercations.  Neurological: Patient reports intermittent dizziness.  Denies difficulty with memory, difficulty with speech or problems with balance and coordination.  Psych: Patient has a history of anxiety and depression.  Denies  SI/HI.  No other specific complaints in a complete review of systems (except as listed in HPI above).  Objective:   Physical Exam There were no vitals taken for this visit.   Wt Readings from Last 3 Encounters:  08/13/23 165 lb 6.4 oz (75 kg)  08/05/23 165 lb 3.2 oz (74.9 kg)  02/04/23 165 lb 3.2 oz (74.9 kg)    General: Appears her stated age, overweight, in NAD. Skin: Warm, dry and intact. No ulcerations noted. HEENT: Head: normal shape and size; Eyes: sclera white, no icterus, conjunctiva pink, PERRLA and EOMs intact;  Cardiovascular: Normal rate and rhythm. S1,S2 noted.  No murmur, rubs or gallops noted. No JVD or BLE edema. No carotid bruits noted. Pulmonary/Chest: Normal effort and positive vesicular breath sounds. No respiratory distress. No wheezes, rales or ronchi noted.  Musculoskeletal: Joint enlargement noted of right knee. Strength 5/5 BLE.  No difficulty with gait.  Neurological: Alert and oriented. Coordination normal.  Psychiatric: Mood and affect normal. Mildly anxious appearing. Judgment and thought content normal.     BMET    Component Value Date/Time   NA 138 08/05/2023 0852   K 4.4 08/05/2023 0852   CL 101 08/05/2023 0852   CO2 26 08/05/2023 0852   GLUCOSE 168 (H) 08/05/2023 0852   BUN 19 08/05/2023 0852   CREATININE 0.63 08/05/2023 0852   CALCIUM  9.6 08/05/2023 0852   GFRNONAA >60 03/06/2021 0933   GFRNONAA 94 02/11/2018 0928   GFRAA 109 02/11/2018 0928    Lipid Panel     Component Value Date/Time   CHOL 107 08/05/2023 0852   TRIG 132 08/05/2023 0852   HDL 38 (L) 08/05/2023 0852   CHOLHDL 2.8 08/05/2023 0852   LDLCALC 47 08/05/2023 0852    CBC    Component Value Date/Time   WBC 6.8 08/05/2023 0852   RBC 4.73 08/05/2023 0852   HGB 14.1 08/05/2023 0852   HCT 42.7 08/05/2023 0852   PLT 251 08/05/2023 9147  MCV 90.3 08/05/2023 0852   MCH 29.8 08/05/2023 0852   MCHC 33.0 08/05/2023 0852   RDW 12.6 08/05/2023 0852   LYMPHSABS 1.3  03/06/2021 0933   MONOABS 1.0 03/06/2021 0933   EOSABS 0.0 03/06/2021 0933   BASOSABS 0.0 03/06/2021 0933    Hgb A1C Lab Results  Component Value Date   HGBA1C 7.8 (H) 08/05/2023            Assessment & Plan:    RTC in 6 months for your annual exam Angeline Laura, NP

## 2024-02-09 ENCOUNTER — Other Ambulatory Visit: Payer: Self-pay | Admitting: Internal Medicine

## 2024-02-09 ENCOUNTER — Ambulatory Visit: Admitting: Internal Medicine

## 2024-02-09 ENCOUNTER — Encounter: Payer: Self-pay | Admitting: Internal Medicine

## 2024-02-09 VITALS — BP 122/64 | Ht 65.0 in | Wt 171.2 lb

## 2024-02-09 DIAGNOSIS — M8588 Other specified disorders of bone density and structure, other site: Secondary | ICD-10-CM

## 2024-02-09 DIAGNOSIS — Z6828 Body mass index (BMI) 28.0-28.9, adult: Secondary | ICD-10-CM

## 2024-02-09 DIAGNOSIS — M25561 Pain in right knee: Secondary | ICD-10-CM

## 2024-02-09 DIAGNOSIS — E1165 Type 2 diabetes mellitus with hyperglycemia: Secondary | ICD-10-CM | POA: Diagnosis not present

## 2024-02-09 DIAGNOSIS — E1169 Type 2 diabetes mellitus with other specified complication: Secondary | ICD-10-CM

## 2024-02-09 DIAGNOSIS — F331 Major depressive disorder, recurrent, moderate: Secondary | ICD-10-CM

## 2024-02-09 DIAGNOSIS — F5101 Primary insomnia: Secondary | ICD-10-CM | POA: Diagnosis not present

## 2024-02-09 DIAGNOSIS — Z7984 Long term (current) use of oral hypoglycemic drugs: Secondary | ICD-10-CM

## 2024-02-09 DIAGNOSIS — J3089 Other allergic rhinitis: Secondary | ICD-10-CM

## 2024-02-09 DIAGNOSIS — G8929 Other chronic pain: Secondary | ICD-10-CM

## 2024-02-09 DIAGNOSIS — E1129 Type 2 diabetes mellitus with other diabetic kidney complication: Secondary | ICD-10-CM | POA: Diagnosis not present

## 2024-02-09 DIAGNOSIS — G4700 Insomnia, unspecified: Secondary | ICD-10-CM | POA: Insufficient documentation

## 2024-02-09 DIAGNOSIS — F32A Depression, unspecified: Secondary | ICD-10-CM

## 2024-02-09 DIAGNOSIS — F411 Generalized anxiety disorder: Secondary | ICD-10-CM | POA: Diagnosis not present

## 2024-02-09 DIAGNOSIS — R42 Dizziness and giddiness: Secondary | ICD-10-CM | POA: Diagnosis not present

## 2024-02-09 DIAGNOSIS — E785 Hyperlipidemia, unspecified: Secondary | ICD-10-CM

## 2024-02-09 DIAGNOSIS — H409 Unspecified glaucoma: Secondary | ICD-10-CM | POA: Diagnosis not present

## 2024-02-09 NOTE — Assessment & Plan Note (Signed)
 Complicated by diabetes C-Met and lipid profile today Encouraged to consume low-fat diet Advised her to restart atorvastatin  80 mg and ezetimibe  10 mg to reduce her risk of heart attack and stroke Advised her to stop fish oil OTC

## 2024-02-09 NOTE — Assessment & Plan Note (Signed)
 Advised her that she can take up to 10 mg of melatonin nightly Will monitor

## 2024-02-09 NOTE — Patient Instructions (Signed)

## 2024-02-09 NOTE — Assessment & Plan Note (Signed)
 Deteriorated since stopping medication Advised to restart citalopram  20 mg daily and buspirone  5 mg nightly Support offered

## 2024-02-09 NOTE — Assessment & Plan Note (Signed)
 She will start latanoprost eyedrops as prescribed by ophthalmology

## 2024-02-09 NOTE — Assessment & Plan Note (Signed)
 Advised her to restart calcium  and vitamin D  Encourage daily weightbearing exercise

## 2024-02-09 NOTE — Assessment & Plan Note (Signed)
Encourage regular physical activity Continue ibuprofen OTC as needed

## 2024-02-09 NOTE — Assessment & Plan Note (Signed)
 Urine microalbumin reviewed Advised her to restart lisinopril  5 mg for renal protection

## 2024-02-09 NOTE — Assessment & Plan Note (Signed)
 Encourage diet and exercise for weight loss

## 2024-02-09 NOTE — Assessment & Plan Note (Signed)
 Continue epley maneuver and meclizine  25 mg 3 times daily as needed

## 2024-02-09 NOTE — Assessment & Plan Note (Signed)
 A1c today Urine microalbumin has been checked within the last year Encouraged her to consume a low-carb diet and exercise for weight loss Advised her to restart metformin  1000 mg twice daily, pred 2 mg daily Encourage routine eye exam, will request copy Encouraged routine foot exam Immunizations UTD

## 2024-02-10 ENCOUNTER — Ambulatory Visit: Payer: Self-pay | Admitting: Internal Medicine

## 2024-02-10 DIAGNOSIS — R748 Abnormal levels of other serum enzymes: Secondary | ICD-10-CM

## 2024-02-10 LAB — HEMOGLOBIN A1C
Hgb A1c MFr Bld: 8.9 % — ABNORMAL HIGH (ref <5.7)
Mean Plasma Glucose: 209 mg/dL
eAG (mmol/L): 11.6 mmol/L

## 2024-02-10 LAB — COMPREHENSIVE METABOLIC PANEL WITH GFR
AG Ratio: 1.2 (calc) (ref 1.0–2.5)
ALT: 58 U/L — ABNORMAL HIGH (ref 6–29)
AST: 51 U/L — ABNORMAL HIGH (ref 10–35)
Albumin: 4.3 g/dL (ref 3.6–5.1)
Alkaline phosphatase (APISO): 79 U/L (ref 37–153)
BUN: 16 mg/dL (ref 7–25)
CO2: 27 mmol/L (ref 20–32)
Calcium: 9.5 mg/dL (ref 8.6–10.4)
Chloride: 99 mmol/L (ref 98–110)
Creat: 0.61 mg/dL (ref 0.60–1.00)
Globulin: 3.7 g/dL (ref 1.9–3.7)
Glucose, Bld: 297 mg/dL — ABNORMAL HIGH (ref 65–99)
Potassium: 4.7 mmol/L (ref 3.5–5.3)
Sodium: 135 mmol/L (ref 135–146)
Total Bilirubin: 1.1 mg/dL (ref 0.2–1.2)
Total Protein: 8 g/dL (ref 6.1–8.1)
eGFR: 94 mL/min/1.73m2 (ref >=60)

## 2024-02-10 LAB — CBC
HCT: 44.5 % (ref 35.9–46.0)
Hemoglobin: 15 g/dL (ref 11.7–15.5)
MCH: 31.2 pg (ref 27.0–33.0)
MCHC: 33.7 g/dL (ref 31.6–35.4)
MCV: 92.5 fL (ref 81.4–101.7)
MPV: 10.5 fL (ref 7.5–12.5)
Platelets: 246 Thousand/uL (ref 140–400)
RBC: 4.81 Million/uL (ref 3.80–5.10)
RDW: 12.1 % (ref 11.0–15.0)
WBC: 6.8 Thousand/uL (ref 3.8–10.8)

## 2024-02-10 LAB — LIPID PANEL
Cholesterol: 187 mg/dL (ref <200)
HDL: 41 mg/dL — ABNORMAL LOW (ref >=50)
LDL Cholesterol (Calc): 105 mg/dL — ABNORMAL HIGH
Non-HDL Cholesterol (Calc): 146 mg/dL — ABNORMAL HIGH (ref <130)
Total CHOL/HDL Ratio: 4.6 (calc) (ref <5.0)
Triglycerides: 305 mg/dL — ABNORMAL HIGH (ref <150)

## 2024-02-10 NOTE — Telephone Encounter (Signed)
 Requested Prescriptions  Pending Prescriptions Disp Refills   citalopram  (CELEXA ) 20 MG tablet [Pharmacy Med Name: CITALOPRAM  HBR 20 MG TABLET] 90 tablet 0    Sig: TAKE 1 TABLET BY MOUTH EVERY DAY     Psychiatry:  Antidepressants - SSRI Passed - 02/10/2024  3:38 PM      Passed - Completed PHQ-2 or PHQ-9 in the last 360 days      Passed - Valid encounter within last 6 months    Recent Outpatient Visits           Yesterday MDD (major depressive disorder), recurrent episode, moderate (HCC)   [place] [facility] [place], [name], NP   6 months ago Encounter for general adult medical examination with abnormal findings    [facility] [place], [name], NP

## 2024-02-11 MED ORDER — GLIMEPIRIDE 2 MG PO TABS: 2.0000 mg | ORAL_TABLET | Freq: Two times a day (BID) | ORAL | 1 refills | Status: AC

## 2024-02-16 ENCOUNTER — Ambulatory Visit: Admission: RE | Admit: 2024-02-16 | Discharge: 2024-02-16 | Attending: Internal Medicine | Admitting: Internal Medicine

## 2024-02-16 DIAGNOSIS — R748 Abnormal levels of other serum enzymes: Secondary | ICD-10-CM | POA: Diagnosis present

## 2024-02-20 ENCOUNTER — Ambulatory Visit: Payer: Self-pay | Admitting: Internal Medicine

## 2024-03-02 ENCOUNTER — Other Ambulatory Visit: Payer: Self-pay | Admitting: Internal Medicine

## 2024-03-03 NOTE — Telephone Encounter (Signed)
 Requested Prescriptions  Pending Prescriptions Disp Refills   metFORMIN  (GLUCOPHAGE ) 1000 MG tablet [Pharmacy Med Name: METFORMIN  HCL 1,000 MG TABLET] 180 tablet 0    Sig: TAKE 1 TABLET (1,000 MG TOTAL) BY MOUTH TWICE A DAY WITH FOOD     Endocrinology:  Diabetes - Biguanides Failed - 03/03/2024 11:38 AM      Failed - HBA1C is between 0 and 7.9 and within 180 days    Hgb A1c MFr Bld  Date Value Ref Range Status  02/09/2024 8.9 (H) <5.7 % Final    Comment:    For someone without known diabetes, a hemoglobin A1c value of 6.5% or greater indicates that they may have  diabetes and this should be confirmed with a follow-up  test. . For someone with known diabetes, a value <7% indicates  that their diabetes is well controlled and a value  greater than or equal to 7% indicates suboptimal  control. A1c targets should be individualized based on  duration of diabetes, age, comorbid conditions, and  other considerations. . Currently, no consensus exists regarding use of hemoglobin A1c for diagnosis of diabetes for children. .          Failed - B12 Level in normal range and within 720 days    No results found for: VITAMINB12       Failed - CBC within normal limits and completed in the last 12 months    WBC  Date Value Ref Range Status  02/09/2024 6.8 3.8 - 10.8 Thousand/uL Final   RBC  Date Value Ref Range Status  02/09/2024 4.81 3.80 - 5.10 Million/uL Final   Hemoglobin  Date Value Ref Range Status  02/09/2024 15.0 11.7 - 15.5 g/dL Final   HCT  Date Value Ref Range Status  02/09/2024 44.5 35.9 - 46.0 % Final   MCHC  Date Value Ref Range Status  02/09/2024 33.7 31.6 - 35.4 g/dL Final   [facility]  Date Value Ref Range Status  02/09/2024 31.2 27.0 - 33.0 pg Final   MCV  Date Value Ref Range Status  02/09/2024 92.5 81.4 - 101.7 fL Final   No results found for: PLTCOUNTKUC, LABPLAT, POCPLA RDW  Date Value Ref Range Status  02/09/2024 12.1 11.0 - 15.0 % Final          Passed - Cr in normal range and within 360 days    Creat  Date Value Ref Range Status  02/09/2024 0.61 0.60 - 1.00 mg/dL Final   Creatinine, Urine  Date Value Ref Range Status  08/05/2023 97 20 - 275 mg/dL Final         Passed - eGFR in normal range and within 360 days    GFR, Est African American  Date Value Ref Range Status  02/11/2018 109 > OR = 60 mL/min/1.35m2 Final   GFR, Est Non African American  Date Value Ref Range Status  02/11/2018 94 > OR = 60 mL/min/1.59m2 Final   GFR, Estimated  Date Value Ref Range Status  03/06/2021 >60 >60 mL/min Final    Comment:    (NOTE) Calculated using the CKD-EPI Creatinine Equation (2021)    GFR  Date Value Ref Range Status  07/12/2015 87.75 >60.00 mL/min Final   eGFR  Date Value Ref Range Status  02/09/2024 94 > OR = 60 mL/min/1.1m2 Final         Passed - Valid encounter within last 6 months    Recent Outpatient Visits  3 weeks ago MDD (major depressive disorder), recurrent episode, moderate (HCC)   [place] [facility] [place], [name], NP   7 months ago Encounter for general adult medical examination with abnormal findings   [place] [facility] [place], [name], [place]

## 2024-03-20 ENCOUNTER — Other Ambulatory Visit: Payer: Self-pay | Admitting: Internal Medicine

## 2024-03-20 DIAGNOSIS — E1165 Type 2 diabetes mellitus with hyperglycemia: Secondary | ICD-10-CM

## 2024-03-20 DIAGNOSIS — E1129 Type 2 diabetes mellitus with other diabetic kidney complication: Secondary | ICD-10-CM

## 2024-03-21 ENCOUNTER — Encounter: Payer: Self-pay | Admitting: Internal Medicine

## 2024-05-10 ENCOUNTER — Ambulatory Visit: Admitting: Internal Medicine

## 2024-08-10 ENCOUNTER — Encounter: Admitting: Internal Medicine

## 2024-08-18 ENCOUNTER — Ambulatory Visit

## 2024-08-23 ENCOUNTER — Ambulatory Visit
